# Patient Record
Sex: Male | Born: 1955
Health system: Southern US, Community
[De-identification: ages and names within clinical notes are randomized; demographics above are authoritative.]

## PROBLEM LIST (undated history)

## (undated) DIAGNOSIS — I639 Cerebral infarction, unspecified: Secondary | ICD-10-CM

## (undated) DIAGNOSIS — T4145XA Adverse effect of unspecified anesthetic, initial encounter: Secondary | ICD-10-CM

## (undated) DIAGNOSIS — M549 Dorsalgia, unspecified: Secondary | ICD-10-CM

## (undated) DIAGNOSIS — M1990 Unspecified osteoarthritis, unspecified site: Secondary | ICD-10-CM

## (undated) DIAGNOSIS — Z87442 Personal history of urinary calculi: Secondary | ICD-10-CM

## (undated) DIAGNOSIS — D472 Monoclonal gammopathy: Secondary | ICD-10-CM

## (undated) DIAGNOSIS — G40909 Epilepsy, unspecified, not intractable, without status epilepticus: Secondary | ICD-10-CM

## (undated) DIAGNOSIS — I10 Essential (primary) hypertension: Secondary | ICD-10-CM

## (undated) DIAGNOSIS — G039 Meningitis, unspecified: Secondary | ICD-10-CM

## (undated) DIAGNOSIS — I6529 Occlusion and stenosis of unspecified carotid artery: Secondary | ICD-10-CM

## (undated) DIAGNOSIS — Z9889 Other specified postprocedural states: Secondary | ICD-10-CM

## (undated) DIAGNOSIS — F329 Major depressive disorder, single episode, unspecified: Secondary | ICD-10-CM

## (undated) DIAGNOSIS — I714 Abdominal aortic aneurysm, without rupture, unspecified: Secondary | ICD-10-CM

## (undated) DIAGNOSIS — R569 Unspecified convulsions: Secondary | ICD-10-CM

## (undated) DIAGNOSIS — T8859XA Other complications of anesthesia, initial encounter: Secondary | ICD-10-CM

## (undated) DIAGNOSIS — D649 Anemia, unspecified: Secondary | ICD-10-CM

## (undated) DIAGNOSIS — E785 Hyperlipidemia, unspecified: Secondary | ICD-10-CM

## (undated) DIAGNOSIS — R1909 Other intra-abdominal and pelvic swelling, mass and lump: Secondary | ICD-10-CM

## (undated) DIAGNOSIS — F32A Depression, unspecified: Secondary | ICD-10-CM

## (undated) DIAGNOSIS — J449 Chronic obstructive pulmonary disease, unspecified: Secondary | ICD-10-CM

## (undated) DIAGNOSIS — I771 Stricture of artery: Secondary | ICD-10-CM

## (undated) DIAGNOSIS — R58 Hemorrhage, not elsewhere classified: Secondary | ICD-10-CM

## (undated) DIAGNOSIS — R112 Nausea with vomiting, unspecified: Secondary | ICD-10-CM

## (undated) HISTORY — DX: Hyperlipidemia, unspecified: E78.5

## (undated) HISTORY — DX: Cerebral infarction, unspecified: I63.9

## (undated) HISTORY — DX: Essential (primary) hypertension: I10

## (undated) HISTORY — DX: Hemorrhage, not elsewhere classified: R58

## (undated) HISTORY — DX: Epilepsy, unspecified, not intractable, without status epilepticus: G40.909

## (undated) HISTORY — DX: Monoclonal gammopathy: D47.2

## (undated) HISTORY — DX: Meningitis, unspecified: G03.9

## (undated) HISTORY — DX: Stricture of artery: I77.1

## (undated) HISTORY — DX: Occlusion and stenosis of unspecified carotid artery: I65.29

---

## 1994-04-12 DIAGNOSIS — R58 Hemorrhage, not elsewhere classified: Secondary | ICD-10-CM

## 1994-04-12 HISTORY — DX: Hemorrhage, not elsewhere classified: R58

## 1999-07-06 ENCOUNTER — Ambulatory Visit (HOSPITAL_BASED_OUTPATIENT_CLINIC_OR_DEPARTMENT_OTHER): Admission: RE | Admit: 1999-07-06 | Discharge: 1999-07-06 | Payer: Self-pay | Admitting: General Surgery

## 2000-06-16 ENCOUNTER — Encounter (INDEPENDENT_AMBULATORY_CARE_PROVIDER_SITE_OTHER): Payer: Self-pay | Admitting: *Deleted

## 2000-06-16 ENCOUNTER — Ambulatory Visit (HOSPITAL_COMMUNITY): Admission: RE | Admit: 2000-06-16 | Discharge: 2000-06-16 | Payer: Self-pay | Admitting: Gastroenterology

## 2001-06-16 HISTORY — PX: COLONOSCOPY: SHX174

## 2005-06-21 ENCOUNTER — Emergency Department (HOSPITAL_COMMUNITY): Admission: EM | Admit: 2005-06-21 | Discharge: 2005-06-21 | Payer: Self-pay | Admitting: Emergency Medicine

## 2007-11-03 ENCOUNTER — Encounter: Admission: RE | Admit: 2007-11-03 | Discharge: 2007-11-03 | Payer: Self-pay | Admitting: Internal Medicine

## 2009-05-27 ENCOUNTER — Encounter: Payer: Self-pay | Admitting: Emergency Medicine

## 2009-05-27 ENCOUNTER — Ambulatory Visit: Payer: Self-pay | Admitting: Cardiology

## 2009-05-27 ENCOUNTER — Ambulatory Visit: Payer: Self-pay | Admitting: Diagnostic Radiology

## 2009-05-27 ENCOUNTER — Inpatient Hospital Stay (HOSPITAL_COMMUNITY): Admission: AD | Admit: 2009-05-27 | Discharge: 2009-05-30 | Payer: Self-pay | Admitting: Internal Medicine

## 2009-05-27 DIAGNOSIS — I639 Cerebral infarction, unspecified: Secondary | ICD-10-CM

## 2009-05-27 HISTORY — DX: Cerebral infarction, unspecified: I63.9

## 2009-05-28 ENCOUNTER — Encounter (INDEPENDENT_AMBULATORY_CARE_PROVIDER_SITE_OTHER): Payer: Self-pay | Admitting: Internal Medicine

## 2009-05-29 ENCOUNTER — Ambulatory Visit: Payer: Self-pay | Admitting: Vascular Surgery

## 2009-06-10 DIAGNOSIS — I639 Cerebral infarction, unspecified: Secondary | ICD-10-CM

## 2009-06-10 HISTORY — DX: Cerebral infarction, unspecified: I63.9

## 2009-06-10 HISTORY — PX: OTHER SURGICAL HISTORY: SHX169

## 2009-06-12 ENCOUNTER — Encounter: Payer: Self-pay | Admitting: Vascular Surgery

## 2009-06-12 ENCOUNTER — Inpatient Hospital Stay (HOSPITAL_COMMUNITY): Admission: RE | Admit: 2009-06-12 | Discharge: 2009-06-13 | Payer: Self-pay | Admitting: Vascular Surgery

## 2009-06-12 HISTORY — PX: CAROTID ENDARTERECTOMY: SUR193

## 2009-07-04 ENCOUNTER — Ambulatory Visit: Payer: Self-pay | Admitting: Vascular Surgery

## 2009-07-05 HISTORY — PX: OTHER SURGICAL HISTORY: SHX169

## 2009-12-16 ENCOUNTER — Ambulatory Visit: Payer: Self-pay | Admitting: Diagnostic Radiology

## 2009-12-16 ENCOUNTER — Encounter: Payer: Self-pay | Admitting: Emergency Medicine

## 2009-12-16 ENCOUNTER — Ambulatory Visit: Payer: Self-pay | Admitting: Cardiovascular Disease

## 2009-12-16 ENCOUNTER — Inpatient Hospital Stay (HOSPITAL_COMMUNITY): Admission: AD | Admit: 2009-12-16 | Discharge: 2009-12-17 | Payer: Self-pay | Admitting: Neurology

## 2009-12-17 ENCOUNTER — Encounter (INDEPENDENT_AMBULATORY_CARE_PROVIDER_SITE_OTHER): Payer: Self-pay | Admitting: Neurology

## 2010-05-08 ENCOUNTER — Emergency Department (INDEPENDENT_AMBULATORY_CARE_PROVIDER_SITE_OTHER)
Admission: EM | Admit: 2010-05-08 | Discharge: 2010-05-09 | Payer: 59 | Source: Home / Self Care | Admitting: Emergency Medicine

## 2010-05-08 DIAGNOSIS — R079 Chest pain, unspecified: Secondary | ICD-10-CM

## 2010-05-08 LAB — URINALYSIS, ROUTINE W REFLEX MICROSCOPIC
Hgb urine dipstick: NEGATIVE
Nitrite: NEGATIVE
Protein, ur: NEGATIVE mg/dL
Urobilinogen, UA: 0.2 mg/dL (ref 0.0–1.0)
pH: 7 (ref 5.0–8.0)

## 2010-05-08 LAB — CBC
Hemoglobin: 14.7 g/dL (ref 13.0–17.0)
MCH: 32 pg (ref 26.0–34.0)
MCHC: 35.9 g/dL (ref 30.0–36.0)
MCV: 89.1 fL (ref 78.0–100.0)
Platelets: 256 10*3/uL (ref 150–400)

## 2010-05-08 LAB — COMPREHENSIVE METABOLIC PANEL
Albumin: 4.2 g/dL (ref 3.5–5.2)
Alkaline Phosphatase: 110 U/L (ref 39–117)
Calcium: 9.4 mg/dL (ref 8.4–10.5)
Creatinine, Ser: 0.8 mg/dL (ref 0.4–1.5)
GFR calc Af Amer: >60 mL/min (ref >60)
GFR calc non Af Amer: >60 mL/min (ref >60)
Glucose, Bld: 127 mg/dL — ABNORMAL HIGH (ref 70–99)
Sodium: 141 mEq/L (ref 135–145)
Total Protein: 7.7 g/dL (ref 6.0–8.3)

## 2010-05-08 LAB — POCT TOXICOLOGY PANEL

## 2010-05-08 LAB — POCT CARDIAC MARKERS
CKMB, poc: <1 ng/mL — ABNORMAL LOW (ref 1.0–8.0)
Myoglobin, poc: 44 ng/mL (ref 12–200)
Troponin i, poc: <0.05 ng/mL (ref 0.00–0.09)
Troponin i, poc: <0.05 ng/mL (ref 0.00–0.09)

## 2010-05-08 LAB — DIFFERENTIAL
Lymphs Abs: 2.6 10*3/uL (ref 0.7–4.0)
Monocytes Relative: 11 % (ref 3–12)
Neutro Abs: 5 10*3/uL (ref 1.7–7.7)
Neutrophils Relative %: 55 % (ref 43–77)

## 2010-05-09 ENCOUNTER — Inpatient Hospital Stay (HOSPITAL_COMMUNITY)
Admission: EM | Admit: 2010-05-09 | Discharge: 2010-05-10 | Payer: Self-pay | Attending: Internal Medicine | Admitting: Internal Medicine

## 2010-05-09 LAB — HEMOGLOBIN A1C
Hgb A1c MFr Bld: 5.6 % (ref <5.7)
Mean Plasma Glucose: 114 mg/dL (ref <117)

## 2010-05-09 LAB — CARDIAC PANEL(CRET KIN+CKTOT+MB+TROPI)
CK, MB: 1.2 ng/mL (ref 0.3–4.0)
Relative Index: INVALID (ref 0.0–2.5)
Relative Index: INVALID (ref 0.0–2.5)
Total CK: 70 U/L (ref 7–232)
Total CK: 62 U/L (ref 7–232)

## 2010-05-09 LAB — LIPID PANEL
HDL: 31 mg/dL — ABNORMAL LOW (ref >39)
LDL Cholesterol: 105 mg/dL — ABNORMAL HIGH (ref 0–99)
Triglycerides: 56 mg/dL (ref <150)
VLDL: 11 mg/dL (ref 0–40)

## 2010-05-18 DIAGNOSIS — G40909 Epilepsy, unspecified, not intractable, without status epilepticus: Secondary | ICD-10-CM | POA: Insufficient documentation

## 2010-05-19 ENCOUNTER — Encounter: Payer: Self-pay | Admitting: Cardiovascular Disease

## 2010-05-19 ENCOUNTER — Ambulatory Visit (INDEPENDENT_AMBULATORY_CARE_PROVIDER_SITE_OTHER): Payer: 59 | Admitting: Cardiovascular Disease

## 2010-05-19 ENCOUNTER — Other Ambulatory Visit: Payer: Self-pay | Admitting: Gastroenterology

## 2010-05-19 DIAGNOSIS — I10 Essential (primary) hypertension: Secondary | ICD-10-CM

## 2010-05-19 DIAGNOSIS — F17200 Nicotine dependence, unspecified, uncomplicated: Secondary | ICD-10-CM

## 2010-05-19 DIAGNOSIS — R079 Chest pain, unspecified: Secondary | ICD-10-CM

## 2010-05-19 DIAGNOSIS — R0989 Other specified symptoms and signs involving the circulatory and respiratory systems: Secondary | ICD-10-CM

## 2010-05-20 LAB — CONVERTED CEMR LAB
BUN: 10 mg/dL (ref 6–23)
Basophils Absolute: 0 10*3/uL (ref 0.0–0.1)
CO2: 28 meq/L (ref 19–32)
Eosinophils Relative: 4 % (ref 0–5)
Glucose, Bld: 86 mg/dL (ref 70–99)
HCT: 41.2 % (ref 39.0–52.0)
Hemoglobin: 14.1 g/dL (ref 13.0–17.0)
Lymphocytes Relative: 28 % (ref 12–46)
MCHC: 34.2 g/dL (ref 30.0–36.0)
Monocytes Absolute: 1 10*3/uL (ref 0.1–1.0)
Monocytes Relative: 11 % (ref 3–12)
Potassium: 4.2 meq/L (ref 3.5–5.3)
RDW: 12.3 % (ref 11.5–15.5)

## 2010-05-26 ENCOUNTER — Observation Stay (HOSPITAL_COMMUNITY)
Admission: RE | Admit: 2010-05-26 | Discharge: 2010-05-26 | Disposition: A | Discharge status: Home / Self Care | Payer: 59 | Source: Ambulatory Visit | Attending: Cardiovascular Disease | Admitting: Cardiovascular Disease

## 2010-05-26 DIAGNOSIS — Z8673 Personal history of transient ischemic attack (TIA), and cerebral infarction without residual deficits: Secondary | ICD-10-CM | POA: Insufficient documentation

## 2010-05-26 DIAGNOSIS — I10 Essential (primary) hypertension: Secondary | ICD-10-CM | POA: Insufficient documentation

## 2010-05-26 DIAGNOSIS — F17200 Nicotine dependence, unspecified, uncomplicated: Principal | ICD-10-CM | POA: Insufficient documentation

## 2010-05-26 DIAGNOSIS — Z01810 Encounter for preprocedural cardiovascular examination: Secondary | ICD-10-CM | POA: Insufficient documentation

## 2010-05-26 DIAGNOSIS — E785 Hyperlipidemia, unspecified: Secondary | ICD-10-CM | POA: Insufficient documentation

## 2010-05-26 DIAGNOSIS — I2510 Atherosclerotic heart disease of native coronary artery without angina pectoris: Secondary | ICD-10-CM

## 2010-05-28 NOTE — Procedures (Signed)
NAMEJESSIAH, STEINHART NO.:  [id number]  MEDICAL RECORD NO.:  [id number]           PATIENT TYPE:  I  LOCATION:  6522                         FACILITY:  [facility]  PHYSICIAN:  Verne Carrow, MDDATE OF BIRTH:  [birth year]  DATE OF PROCEDURE:  05/26/2010 DATE OF DISCHARGE:  05/26/2010                           CARDIAC CATHETERIZATION   PROCEDURE PERFORMED: 1. Left heart catheterization 2. Selective coronary angiography. 3. Left ventricular angiogram.  ARTERIAL ACCESS SITE:  Right radial artery.  OPERATOR:  Verne Carrow, MD  INDICATIONS:  This is a 55 year old Caucasian male with a history of ongoing tobacco abuse, hypertension, hyperlipidemia, previous stroke and previous carotid endarterectomy, who I saw in the office last week.  The patient had episodes of exertional chest pain.  He was admitted to the hospital and ruled out for myocardial infarction with serial cardiac enzymes.  He was discharged to home for further workup.  I felt that based on his risk factors and the fact that he had known vascular disease, a cardiac catheterization was indicated to ultimately exclude obstructive coronary artery disease and the cause of his chest pain.  DETAILS OF PROCEDURE:  The patient was brought to the main Cardiac Catheterization Laboratory after signing informed consent for the procedure.  An Freida Busman test was performed on the right wrist and was positive.  The right wrist was prepped and draped in sterile fashion.  A 1% lidocaine was used for local anesthesia.  A 5-French sheath was inserted into the right radial artery without difficulty.  A 3 mg of verapamil was given through the sheath.  A 4000 units of intravenous heparin was given after sheath insertion.  Standard diagnostic catheters were used to perform selective coronary angiography.  A pigtail catheter was used to perform a left ventricular angiogram.  The patient tolerated the procedure well.   The sheath was removed from the right radial artery and a Terumo hemostasis band was applied over the arteriotomy site. There were no immediate complications.  The patient was taken to the holding area in stable condition.  HEMODYNAMIC FINDINGS:  Central aortic pressure 99/58.  Left ventricular pressure 127/7.  Left ventricular end-diastolic pressure of 14.  ANGIOGRAPHIC FINDINGS: 1. The left main coronary artery had no obstructive disease. 2. The left anterior descending was a large vessel that coursed to the     apex and gave off a moderate-sized diagonal branch.  A left     anterior descending artery had mild 20% plaque in the midportion.     There were no obstructive lesions noted in this vessel. 3. Circumflex artery was comprised mainly of an obtuse marginal branch     was moderate size.  This branch had mild plaque disease, but no     obstructive lesions. 4. The right coronary artery is a large dominant vessel with proximal     20% stenosis, mid 40% stenosis, and distal 30% stenosis.  Posterior     descending artery had an ostial 40% stenosis.  There were no flow-     limiting lesions noted in this vessel. 5. Left ventricular angiogram was performed in the RAO projection  showed normal left ventricular systolic function with ejection     fraction of 55%.  IMPRESSION: 1. Nonobstructive coronary artery disease. 2. Noncardiac chest pain. 3. Normal left ventricular systolic function. 4. Ongoing tobacco abuse.  RECOMMENDATIONS:  At this time, I recommend medical management with a statin and an aspirin.  We will also encourage complete tobacco cessation.     Verne Carrow, MD     CM/MEDQ  D:  05/26/2010  T:  05/27/2010  Job:  [id number]  Electronically Signed by Verne Carrow MD on 05/28/2010 11:07:43 AM

## 2010-05-28 NOTE — Letter (Signed)
Summary: Cardiac Catheterization Instructions- Main Lab  [employer], Main Office  [address]   [place], [place] [address]   Phone: [phone]  Fax: [phone]     05/19/2010 [name], [place]  [address]  [place]  Dear Mr. Charles, [name] are scheduled for Cardiac Catheterization on  05/26/10            with Dr. Clifton James.  Please arrive at the [facility] at 7:00      a.m.on the day of your procedure.  1. DIET     ___x_ Nothing to eat or drink after midnight except your medications with a sip of water.         __x__ YOU MAY TAKE ALL of your remaining medications with a small amount of water.     - PLEASE TAKE YOUR ASPIRIN 325 mg the day before and the morning of your procedure.  2. Plan for one night stay - bring personal belongings (i.e. toothpaste, toothbrush, etc.)  3. Bring a current list of your medications and current insurance cards.  4. Must have a responsible person to drive you home.   5. Someone must be with yu for the first 24 hours after you arrive home.  6. Please wear clothes that are easy to get on and off and wear slip-on shoes.  *Special note: Every effort is made to have your procedure done on time.  Occasionally there are emergencies that present themselves at the hospital that may cause delays.  Please be patient if a delay does occur.  If you have any questions after you get home, please call the office at the number listed above.  Whitney Maeola Sarah RN

## 2010-05-28 NOTE — Assessment & Plan Note (Signed)
Summary: np6/ per chris/chest pain ...records in emr.Marland KitchenLonell Grandchild   Visit Type:  Initial Consult Primary Provider:  [facility]  CC:  chest pain and sob.  History of Present Illness: 55 yo WM with history of carotid artery disease, HTN, hyperlipidemia, previous CVA, ongoing tobacco abuse and family history of CAD who is here today for a new cardiac evaluation. 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 He has not had a previous cardiac evaluation other than an echo in September 2011 which showed mild LVH, normal LV function, no significant valvular heart disease. He has had several episodes of chest pain recently. The most severe episode was on 05/09/10. The pain was severe and radiated to the jaw. There was associated dyspnea. He was admitted to [facility] and ruled out for an MI with serial enzymes. His EKG showed no ischemia. He was discharged home after an overnight stay with plans for follow up in primary care.   He tells me today that he has been working with no recurrent chest pain. He does have SOB with exertion. No other complaints today. He continues to smoke 1ppd.   Current Medications (verified): 1)  Timolol Maleate 0.25 % Soln (Timolol Maleate) .... Uad 2)  Tegretol Xr 400 Mg Xr12h-Tab (Carbamazepine) .... 4 By Mouth Daily 3)  Vitamin D3 2000 Unit Caps (Cholecalciferol) .Marland Kitchen.. 1` By Mouth Daily 4)  Crestor 10 Mg Tabs (Rosuvastatin Calcium) .Marland Kitchen.. 1 By Mouth Daily 5)  Zoloft 50 Mg Tabs (Sertraline Hcl) .Marland Kitchen.. 1 By Mouth Daily 6)  Cialis 10 Mg Tabs (Tadalafil) .... As Needed 7)  Symbicort 80-4.5 Mcg/act Aero (Budesonide-Formoterol Fumarate) .... As Needed 8)  Diovan Hct 160-25 Mg Tabs (Valsartan-Hydrochlorothiazide) .Marland Kitchen.. 1 By Mouth Daily  Allergies (verified): No Known Drug Allergies  Past History:  Past Medical History:  1. Intracerebral hemorrhage, 1996.   2. History of CVA May 27, 2009. TIA, posterior circulation July 2011  3. Hypertension.   4. Hyperlipidemia.   5.  History of right ICA stenosis, status post right carotid       endarterectomy on June 13, 2009.   6. Left carotid artery stenosis.   7. Alcohol abuse in remission since December 08, 2009.   8. Tobacco dependence.  x 40 years (1ppd)  9. Glaucoma.   10.Seizure disorder at time of ICH in 1996  11.History of viral meningitis as child  Past Surgical History:  1. Right inguinal hernia repair on July 05, 2009.   2. Colonoscopy, June 16, 2000 by Dr. Danise Edge, positive for       colonic polyp, non-adenomatous.  3. Right CEA March 2011     Family History: Reviewed history from 05/18/2010 and no changes required. Mother died at age 14 from Alzheimer's.   Father died of  heart attack at age 48 and was a smoker.  Sister-x 2 alive Brother-x 2 alive, 1 deceased.      Social History: Reviewed history from 05/18/2010 and no changes required.  The patient lives with his wife in [place].  He has 2 children. His daughter Shanda Bumps is a nurse on [address] at [employer]  He smokes one-pack per day x30-40 years.   He occasionally drinks. He has a history of abuse in the past No illicit drug use.      Review of Systems       The patient complains of chest pain and shortness of breath.  The patient denies fatigue, malaise, fever, weight gain/loss, vision loss, decreased hearing,  hoarseness, palpitations, prolonged cough, wheezing, sleep apnea, coughing up blood, abdominal pain, blood in stool, nausea, vomiting, diarrhea, heartburn, incontinence, blood in urine, muscle weakness, joint pain, leg swelling, rash, skin lesions, headache, fainting, dizziness, depression, anxiety, enlarged lymph nodes, easy bruising or bleeding, and environmental allergies.    Vital Signs:  Patient profile:   55 year old male Height:      74 inches Weight:      179 pounds BMI:     23.07 Pulse rate:   59 / minute Resp:     18 per minute BP sitting:   155 / 78  (right arm)  Vitals Entered By: Marrion Coy, CNA (May 19, 2010 4:21 PM)  Physical Exam  General:  General: Well developed, well nourished, NAD HEENT: OP clear, mucus membranes moist SKIN: warm, dry Neuro: No focal deficits Musculoskeletal: Muscle strength 5/5 all ext Psychiatric: Mood and affect normal Neck: No JVD, Right  carotid bruits, no left carotid bruit, no thyromegaly, no lymphadenopathy. Lungs:Clear bilaterally, no wheezes, rhonci, crackles CV: RRR no murmurs, gallops rubs Abdomen: soft, NT, ND, BS present Extremities: No edema, pulses 2+.    EKG  Procedure date:  05/19/2010  Findings:      Sinus brady, rate 59 bpm. No ischemic changes.   Impression & Recommendations:  Problem # 1:  CHEST PAIN (ICD-786.50) He has multiple risk factors for CAD including HTN, hyperlipidemia, tobacco abuse and family history of CAD. He is also known to have carotid artery disease. His chest pain is concerning for angina. I have recommended that we proceed with a diagnostic cardiac catheterization to exclude CAD. He has a high pre-test probabliity of disease. He is agreeable to pursue cath. I have reviewed the risks and benefits of the procedure. Will plan for 05/26/10 at 9am. Will check BMET, CBC and coags today.   Orders: T-Basic Metabolic Panel [phone]) T-CBC w/Diff [id number]) T-Protime, Auto ([id number]) EKG w/ Interpretation (93000) Cardiac Catheterization (Cardiac Cath)  Problem # 2:  HYPERTENSION (ICD-401.9) Will adjust therapy following cath.   His updated medication list for this problem includes:    Diovan Hct 160-25 Mg Tabs (Valsartan-hydrochlorothiazide) .Marland Kitchen... 1 by mouth daily  Problem # 3:  NICOTINE ADDICTION (ICD-305.1) Smoking cessation advised.   Patient Instructions: 1)  Your physician recommends that you schedule a follow-up appointment in: 3-4 weeks. 2)  Your physician recommends that you continue on your current medications as directed. Please refer to the Current Medication list given to you today. 3)  Your  physician has requested that you have a cardiac catheterization.  Cardiac catheterization is used to diagnose and/or treat various heart conditions. Doctors may recommend this procedure for a number of different reasons. The most common reason is to evaluate chest pain. Chest pain can be a symptom of coronary artery disease (CAD), and cardiac catheterization can show whether plaque is narrowing or blocking your heart's arteries. This procedure is also used to evaluate the valves, as well as measure the blood flow and oxygen levels in different parts of your heart.  For further information please visit [email or web address]  Please follow instruction sheet, as given.

## 2010-06-03 NOTE — H&P (Signed)
  [name], [name] NO.:  [id number]  MEDICAL RECORD NO.:  [id number]          PATIENT TYPE:  INP  LOCATION:  2035                         FACILITY:  [facility]  PHYSICIAN:  Massie Maroon, MD        DATE OF BIRTH:  04-Jun-1955  DATE OF ADMISSION:  05/09/2010 DATE OF DISCHARGE:                             HISTORY & PHYSICAL   This patient is unassigned.  CHIEF COMPLAINT:  Chest pain.  HISTORY OF PRESENT ILLNESS:  A 55 year old male with a history of cocaine abuse, polysubstance abuse, CAD status post CABG, bipolar disorder, apparently was feeling suicidal and decided to do cocaine and then developed left chest pressure with radiation to the left arm associated with shortness of breath, nausea, and vomiting about an hour prior to arrival in the ED.  The patient states he is still having some discomfort.  His EKG apparently showed tachycardic at 130, normal sinus rhythm, normal axis, early R-wave progression, no ST elevation, similar to April 19, 2003.  Chest x-ray showed mild hyperexpansion without any acute process.  The patient will be admitted for workup of chest pain as well as suicidal ideation.  PAST MEDICAL HISTORY:  Cancelled dictation.     Massie Maroon, MD     JYK/MEDQ  D:  05/09/2010  T:  05/09/2010  Job:  [id number]  Electronically Signed by Pearson Grippe MD on 06/03/2010 06:46:38 AM

## 2010-06-03 NOTE — H&P (Signed)
[name], BUELOW NO.:  [id number]  MEDICAL RECORD NO.:  [id number]          PATIENT TYPE:  INP  LOCATION:  2035                         FACILITY:  [facility]  PHYSICIAN:  Massie Maroon, MD        DATE OF BIRTH:  1955/10/15  DATE OF ADMISSION:  05/09/2010 DATE OF DISCHARGE:                             HISTORY & PHYSICAL   CHIEF COMPLAINT:  Chest pain.  HISTORY OF PRESENT ILLNESS:  A 55 year old man with a history of hypertension, hyperlipidemia, TIA, right ICA stenosis status post right carotid endarterectomy, seizure disorder, intracerebral hemorrhage, viral meningitis, apparently presents with complaints of right-sided chest pain starting about 8 p.m. last night.  It was like "I got hit into the chest" and there was radiation of the pain in the neck and the jaw, and it was associated with some shortness of breath and heartburn. The patient denies any fever, chills, cough, nausea, vomiting, palpitations, diarrhea, bright red blood per rectum or black stool.  The patient was evaluated at the ED at [facility].  He was apparently given sublingual nitroglycerin, but the pain did not go away immediately and it went away about 10 minutes after he was given the nitroglycerin.  The patient apparently had an EKG, which per ED physician showed normal sinus rhythm at 60, normal axis, Q-waves, nonspecific ST changes.  I did not see the EKG in the chart, and it does not appear available to me.  The patient's urine drug screen was positive for opiates.  The patient had 2 sets of cardiac markers, which were negative.  Chest x-ray was negative for any acute process other than some mild hyperexpansion.  The patient will be admitted for workup of chest pain.  PAST MEDICAL HISTORY: 1. Intracerebral hemorrhage, 1996. 2. History of TIA, posterior circulation. 3. Hypertension. 4. Hyperlipidemia. 5. History of right ICA stenosis, status post right carotid  endarterectomy on June 13, 2009. 6. Left carotid artery stenosis. 7. Alcohol abuse in remission since December 08, 2009. 8. Tobacco dependence. 9. Glaucoma. 10.Seizure disorder. 11.History of viral meningitis.  PAST SURGICAL HISTORY: 1. Right inguinal hernia repair on July 05, 2009. 2. Colonoscopy, June 16, 2000 by Dr. Danise Edge, positive for     colonic polyp, non-adenomatous.  SOCIAL HISTORY:  The patient lives with his wife.  He has 2 children. He smokes one-pack per day x30-40 years.  He occasionally drinks.  FAMILY HISTORY:  Mother died at age 31 from Alzheimer's.  Father died of heart attack at age 25 and was a smoker.  REVIEW OF SYSTEMS:  Negative for all 10 organ systems except for pertinent positives stated above.  ALLERGIES:  No known drug allergies.  MEDICATIONS:  Please see med rec.  PHYSICAL EXAMINATION:  VITAL SIGNS:  Temperature 97.5, pulse 63, blood pressure 180/86, pulse ox is 99% on room air. HEENT:  Anicteric, EOMI.  No nystagmus.  Pupils 1.5 mm, symmetric, direct consensual near reflexes intact.  Mucous membranes moist. NECK:  No JVD, no bruit, no thyromegaly, no adenopathy. HEART:  Regular rate and rhythm.  S1 and S2.  No murmurs,  gallops or rubs. LUNGS:  Clear to auscultation bilaterally. ABDOMEN:  Soft, nontender, nondistended.  Positive bowel sounds. EXTREMITIES:  No cyanosis, clubbing, or edema. SKIN:  No rashes. LYMPH NODES:  No adenopathy. NEUROLOGIC:  Nonfocal.  LABORATORY DATA:  WBC 9.0, hemoglobin 14.7, platelet count 256.  Sodium 141, potassium 3.7, BUN 20, creatinine 0.8.  AST 21, ALT 28.  Lipase 142.  Troponin I less than 0.05.  Urinalysis is negative.  Urine toxicology panel positive for opiates and tricyclics.  Chest x-ray negative for any acute process other than some mild hyperexpansion.  ASSESSMENT AND PLAN: 1. Chest pain:  The patient will be placed on telemetry.  We will     check CK, CK-MB, troponin I q.6 h x3 sets.  Check a  lipid panel in     the a.m.  Check a hemoglobin A1c in the a.m.  The patient will be     continued on Plavix 75 mg p.o. daily and Crestor 20 mg p.o. at     bedtime.  Cannot use beta-blocker due to already bradycardic heart     rhythm. 2. Seizure disorder:  Continue Tegretol. 3. Anxiety:  Continue Xanax and Zoloft. 4. Glaucoma:  Continue timolol. 5. Tobacco dependence:  Start nicotine patch. 6. Deep venous thrombosis prophylaxis:  Lovenox. 7. Carotid stenosis, transient ischemic attack:  The patient can have     this followed up as an outpatient. 8. Disposition:  I am not sure if stress testing is available in the     a.m.  If it is not and the patient rules out with negative sets of     cardiac markers, he can be discharged with close followup stress     test, otherwise please obtain a stress test in the morning.     Massie Maroon, MD     JYK/MEDQ  D:  05/09/2010  T:  05/09/2010  Job:  [id number]  Electronically Signed by Pearson Grippe MD on 06/03/2010 06:46:29 AM

## 2010-06-03 NOTE — Discharge Summary (Addendum)
NAMEJACEK, [name] NO.:  [id number]  MEDICAL RECORD NO.:  [id number]          PATIENT TYPE:  INP  LOCATION:  2035                         FACILITY:  [facility]  PHYSICIAN:  Elby Showers, MD    DATE OF BIRTH:  Aug 27, 1955  DATE OF ADMISSION:  05/09/2010 DATE OF DISCHARGE:  05/10/2010                              DISCHARGE SUMMARY   DISCHARGE DIAGNOSES: 1. Atypical chest pain, acute coronary syndrome ruled out. 2. Hypertension. 3. Hyperlipidemia. 4. History of transient ischemic attack, posterior circulation. 5. History of right internal carotid artery stenosis, status post     right carotid endarterectomy, June 13, 2009. 6. Left carotid artery stenosis. 7. Intracerebral hemorrhage in 1996. 8. Alcohol abuse in remission since December 08, 2009. 9. Tobacco dependence. 10.Glaucoma. 11.Seizure disorder. 12.History of viral meningitis.  DISCHARGE MEDICATIONS: 1. Aspirin 81 mg one tablet daily. 2. Tegretol-XR 400 mg one tablet four times daily. 3. Alprazolam 0.25 mg one tablet every 6 hours as needed. 4. Cialis 10 mg one tablet as directed. 5. Crestor 10 mg one tablet daily. 6. Sertraline 50 mg one tablet daily. 7. Dorzolamide/timolol ophthalmic drops one drop both eyes twice a     day. 8. Plavix 75 mg daily.  DISPOSITION:  The patient is discharged in stable condition with no residual chest pain.  He is to follow up with his primary care physician, Dr. Johnella Moloney as soon as possible for appointment.  CONSULTATIONS:  None.  PROCEDURES: 1. Chest x-ray shows mild hyperexpansion without acute cardiopulmonary     disease. 2. EKG; normal sinus rhythm, no ST changes, possible left ventricular     hypertrophy.  HISTORY AND PHYSICAL:  This 55 year old man with history of hypertension; hyperlipidemia; peripheral vascular disease, status post right carotid endarterectomy, presented with complaint of right-sided chest pain.  Pain was described as pressure,  like being hit in the chest, there was radiation to the neck and the jaw.  The pain was associated with shortness of breath.  He was initially evaluated at [facility], sublingual nitroglycerin was administered, pain did not resolve.  He was transferred to [facility] and admitted for ACS rule out.  HOSPITAL COURSE BY PROBLEM: 1. Chest pain.  The patient was admitted to telemetry, there were no     events on telemetry.  He had three sets of negative cardiac     enzymes.  Pain had completely resolved by the morning after     admission.  Labs to evaluate for cardiac risk factors including a     hemoglobin A1c and a lipid profile were performed.  Hemoglobin A1c     is 5.6, lipid panel shows total cholesterol 147, LDL 105, HDL 31.     Note that urine drug screen was positive for opiates, most likely     due to morphine administered in the emergency room. 2. Hypertension.  Blood pressure well controlled in hospital on     current regimen. 3. Hyperlipidemia.  Continue Crestor.     Elby Showers, MD     CW/MEDQ  D:  05/13/2010  T:  05/13/2010  Job:  [id number]  Electronically Signed by Elby Showers MD on 06/03/2010 09:58:37 AM

## 2010-06-23 ENCOUNTER — Other Ambulatory Visit: Payer: Self-pay

## 2010-06-23 ENCOUNTER — Ambulatory Visit: Payer: Self-pay | Admitting: Vascular Surgery

## 2010-06-25 LAB — DIFFERENTIAL
Basophils Relative: 1 % (ref 0–1)
Lymphocytes Relative: 18 % (ref 12–46)
Monocytes Relative: 7 % (ref 3–12)
Neutro Abs: 7 10*3/uL (ref 1.7–7.7)
Neutrophils Relative %: 71 % (ref 43–77)

## 2010-06-25 LAB — CBC
MCH: 32.7 pg (ref 26.0–34.0)
MCHC: 34.5 g/dL (ref 30.0–36.0)
MCV: 94.6 fL (ref 78.0–100.0)
Platelets: 236 10*3/uL (ref 150–400)
RDW: 12.2 % (ref 11.5–15.5)
WBC: 9.9 10*3/uL (ref 4.0–10.5)

## 2010-06-25 LAB — LIPID PANEL
Cholesterol: 171 mg/dL (ref 0–200)
LDL Cholesterol: 114 mg/dL — ABNORMAL HIGH (ref 0–99)
Total CHOL/HDL Ratio: 5.2 RATIO

## 2010-06-25 LAB — CARDIAC PANEL(CRET KIN+CKTOT+MB+TROPI)
CK, MB: 1.1 ng/mL (ref 0.3–4.0)
Troponin I: 0.03 ng/mL (ref 0.00–0.06)

## 2010-06-25 LAB — PROTIME-INR
INR: 0.91 (ref 0.00–1.49)
Prothrombin Time: 12.5 seconds (ref 11.6–15.2)

## 2010-06-25 LAB — HEMOGLOBIN A1C: Hgb A1c MFr Bld: 5.7 % — ABNORMAL HIGH (ref <5.7)

## 2010-06-25 LAB — RAPID URINE DRUG SCREEN, HOSP PERFORMED
Amphetamines: NOT DETECTED
Cocaine: NOT DETECTED
Opiates: NOT DETECTED
Tetrahydrocannabinol: NOT DETECTED

## 2010-06-25 LAB — COMPREHENSIVE METABOLIC PANEL
Albumin: 3.6 g/dL (ref 3.5–5.2)
BUN: 14 mg/dL (ref 6–23)
Calcium: 8.6 mg/dL (ref 8.4–10.5)
Creatinine, Ser: 0.8 mg/dL (ref 0.4–1.5)
Glucose, Bld: 90 mg/dL (ref 70–99)
Total Protein: 7.1 g/dL (ref 6.0–8.3)

## 2010-06-25 LAB — URINALYSIS, ROUTINE W REFLEX MICROSCOPIC
Bilirubin Urine: NEGATIVE
Nitrite: NEGATIVE
Protein, ur: NEGATIVE mg/dL
Specific Gravity, Urine: 1.011 (ref 1.005–1.030)
Urobilinogen, UA: 0.2 mg/dL (ref 0.0–1.0)

## 2010-06-25 LAB — ETHANOL: Alcohol, Ethyl (B): <5 mg/dL (ref 0–10)

## 2010-06-25 LAB — GLUCOSE, CAPILLARY: Glucose-Capillary: 85 mg/dL (ref 70–99)

## 2010-06-25 LAB — CARBAMAZEPINE LEVEL, TOTAL: Carbamazepine Lvl: 14.5 ug/mL — ABNORMAL HIGH (ref 4.0–12.0)

## 2010-06-25 LAB — MRSA PCR SCREENING: MRSA by PCR: NEGATIVE

## 2010-06-30 ENCOUNTER — Other Ambulatory Visit (INDEPENDENT_AMBULATORY_CARE_PROVIDER_SITE_OTHER): Payer: 59

## 2010-06-30 ENCOUNTER — Ambulatory Visit (INDEPENDENT_AMBULATORY_CARE_PROVIDER_SITE_OTHER): Payer: 59 | Admitting: Vascular Surgery

## 2010-06-30 DIAGNOSIS — I6529 Occlusion and stenosis of unspecified carotid artery: Secondary | ICD-10-CM

## 2010-06-30 DIAGNOSIS — Z48812 Encounter for surgical aftercare following surgery on the circulatory system: Secondary | ICD-10-CM

## 2010-07-01 NOTE — Assessment & Plan Note (Signed)
OFFICE VISIT  [name], [name] DOB:  1955-11-09                                       06/30/2010 ZOXWR#:[id number]  The patient presents today for followup of his extracranial cerebrovascular occlusive disease.  He underwent right carotid endarterectomy after suffering a stroke, by myself on June 12, 2009.  He did not make his 46-month followup and is here today for followup.  He reports that he was recently admitted for the possibility of myocardial infarction.  This was ruled out with cardiac cath.  He denies any neurologic deficits.  PAST HISTORY:  Significant for coronary disease, hypertension.  He does continue to smoke cigarettes, 2 packs a day.  He works as an Personnel officer.  He does have a strong family history of premature atherosclerotic disease in his father and mother.  PHYSICAL EXAMINATION:  General:  Well-developed, well-nourished white male appearing stated age in no acute distress.  Vital signs:  Blood pressure is 146/73, pulse 69, respirations 18.  HEENT:  Normal.  He has a well-healed right carotid incision with no bruits bilaterally.  Heart: Regular rate and rhythm.  Chest:  Clear bilaterally without rales, rhonchi or wheezes.  Radial pulses are 2+ bilaterally.  Abdomen:  Soft, nontender.  No masses noted.  Musculoskeletal:  Shows no major deformities or cyanosis.  Neurologic:  No focal weakness, paresthesias. Skin:  Without ulcers or rashes.  He underwent a duplex in our office today and this reveals widely patent endarterectomy on the right with no evidence of stenosis.  He does have a moderate 40%-59% stenosis in the left carotid system on the low end of the scale.  I discussed this with the patient and recommended that we continue to see him on a yearly basis to rule out any progression in his carotid disease.    Larina Earthly, M.D. Electronically Signed  TFE/MEDQ  D:  06/30/2010  T:  07/01/2010  Job:  [id number]

## 2010-07-02 LAB — DIFFERENTIAL
Basophils Absolute: 0.1 10*3/uL (ref 0.0–0.1)
Eosinophils Relative: 4 % (ref 0–5)
Lymphocytes Relative: 29 % (ref 12–46)
Lymphs Abs: 2.2 10*3/uL (ref 0.7–4.0)
Neutro Abs: 4.3 10*3/uL (ref 1.7–7.7)
Neutrophils Relative %: 58 % (ref 43–77)

## 2010-07-02 LAB — BASIC METABOLIC PANEL
BUN: 13 mg/dL (ref 6–23)
Calcium: 8.5 mg/dL (ref 8.4–10.5)
Creatinine, Ser: 0.9 mg/dL (ref 0.4–1.5)
GFR calc non Af Amer: >60 mL/min (ref >60)
Glucose, Bld: 106 mg/dL — ABNORMAL HIGH (ref 70–99)
Potassium: 4.1 mEq/L (ref 3.5–5.1)

## 2010-07-02 LAB — ETHANOL: Alcohol, Ethyl (B): <5 mg/dL (ref 0–10)

## 2010-07-02 LAB — CBC
HCT: 39.5 % (ref 39.0–52.0)
Platelets: 222 10*3/uL (ref 150–400)
RDW: 12.3 % (ref 11.5–15.5)
WBC: 7.5 10*3/uL (ref 4.0–10.5)

## 2010-07-02 LAB — CARBAMAZEPINE LEVEL, TOTAL: Carbamazepine Lvl: 10.6 ug/mL (ref 4.0–12.0)

## 2010-07-03 LAB — LIPID PANEL
LDL Cholesterol: 112 mg/dL — ABNORMAL HIGH (ref 0–99)
Total CHOL/HDL Ratio: 5.8 RATIO
Triglycerides: 114 mg/dL (ref <150)
VLDL: 23 mg/dL (ref 0–40)

## 2010-07-03 LAB — COMPREHENSIVE METABOLIC PANEL
ALT: 14 U/L (ref 0–53)
AST: 15 U/L (ref 0–37)
Alkaline Phosphatase: 95 U/L (ref 39–117)
CO2: 26 mEq/L (ref 19–32)
Calcium: 8.7 mg/dL (ref 8.4–10.5)
GFR calc Af Amer: >60 mL/min (ref >60)
GFR calc non Af Amer: >60 mL/min (ref >60)
Potassium: 3.8 mEq/L (ref 3.5–5.1)
Sodium: 141 mEq/L (ref 135–145)
Total Protein: 6.5 g/dL (ref 6.0–8.3)

## 2010-07-03 LAB — CBC
Hemoglobin: 14.5 g/dL (ref 13.0–17.0)
MCHC: 34.7 g/dL (ref 30.0–36.0)
RBC: 4.4 MIL/uL (ref 4.22–5.81)

## 2010-07-03 LAB — AMMONIA: Ammonia: 73 umol/L — ABNORMAL HIGH (ref 11–35)

## 2010-07-06 LAB — TYPE AND SCREEN
ABO/RH(D): O POS
Antibody Screen: NEGATIVE

## 2010-07-06 LAB — BASIC METABOLIC PANEL
BUN: 7 mg/dL (ref 6–23)
CO2: 25 mEq/L (ref 19–32)
Chloride: 108 mEq/L (ref 96–112)
Creatinine, Ser: 0.82 mg/dL (ref 0.4–1.5)
Glucose, Bld: 120 mg/dL — ABNORMAL HIGH (ref 70–99)

## 2010-07-06 LAB — CBC
MCHC: 34.5 g/dL (ref 30.0–36.0)
MCHC: 34.9 g/dL (ref 30.0–36.0)
MCV: 93.9 fL (ref 78.0–100.0)
Platelets: 171 10*3/uL (ref 150–400)
RDW: 12.6 % (ref 11.5–15.5)
RDW: 12.7 % (ref 11.5–15.5)
WBC: 10.8 10*3/uL — ABNORMAL HIGH (ref 4.0–10.5)

## 2010-07-06 LAB — URINALYSIS, ROUTINE W REFLEX MICROSCOPIC
Hgb urine dipstick: NEGATIVE
Specific Gravity, Urine: 1.02 (ref 1.005–1.030)
Urobilinogen, UA: 0.2 mg/dL (ref 0.0–1.0)

## 2010-07-06 LAB — COMPREHENSIVE METABOLIC PANEL
ALT: 24 U/L (ref 0–53)
AST: 18 U/L (ref 0–37)
Albumin: 3.9 g/dL (ref 3.5–5.2)
Calcium: 8.9 mg/dL (ref 8.4–10.5)
GFR calc Af Amer: >60 mL/min (ref >60)
Sodium: 138 mEq/L (ref 135–145)
Total Protein: 7.5 g/dL (ref 6.0–8.3)

## 2010-07-06 LAB — PROTIME-INR: INR: 1.02 (ref 0.00–1.49)

## 2010-07-06 LAB — MRSA PCR SCREENING: MRSA by PCR: NEGATIVE

## 2010-07-07 NOTE — Procedures (Unsigned)
CAROTID DUPLEX EXAM  INDICATION:  Followup carotid artery stenosis.  HISTORY: Diabetes:  No. Cardiac:  CAD. Hypertension:  Yes. Smoking:  Current. Previous Surgery:  Right carotid endarterectomy 06/12/2009. CV History:  Yes January 2011. Amaurosis Fugax No, Paresthesias No, Hemiparesis No                                      RIGHT             LEFT Brachial systolic pressure:         108               120 Brachial Doppler waveforms:         Within normal limits                Within normal limits Vertebral direction of flow:        Antegrade         Antegrade DUPLEX VELOCITIES (cm/sec) CCA peak systolic                   103               120 ECA peak systolic                   139               137 ICA peak systolic                   76                114 ICA end diastolic                   19                43 PLAQUE MORPHOLOGY:                  Intimal wall thickening             Heterogeneous PLAQUE AMOUNT:                      Minimal           Moderate PLAQUE LOCATION:                    CCA, ICA          CCA, ICA  IMPRESSION: 1. Patent right internal carotid artery with minimal myo intimal     thickening present status post endarterectomy. 2. Left internal carotid artery stenosis in the 40% to 59% range. 3. Patent bilateral external carotid arteries.  ___________________________________________ Larina Earthly, M.D.  SH/MEDQ  D:  06/30/2010  T:  06/30/2010  Job:  [id number]

## 2010-08-25 NOTE — Assessment & Plan Note (Signed)
OFFICE VISIT   MARQUIE, ADERHOLD  DOB:  [birth year]                                       07/04/2009  EXBMW#:[id number]   Patient presents today for follow-up of right carotid endarterectomy for  symptomatic carotid disease on 06/12/2009.  He did well in the hospital  and was discharged home without difficulty.  He is returning to his  baseline and has had no new neurologic deficits.  He reports that over  the past 2 days, he has had increasing shortness of breath and overall  not feeling well.   His carotid incision is well healed.  He has the usual amount of para-  incisional numbness.  He is neurologically at his baseline.  His radial  pulses, femoral, and posterior tibial pulses are 2+ bilaterally.  He  also reports some bilateral lower extremity discomfort as well.   I feel that he is recovering nicely from a vascular standpoint.  I am  unclear of the etiology of his chest issues.  We will discuss this with  Dr. Kevan Ny on the telephone for further planning and will see him in 6  months with a carotid duplex followup.     Larina Earthly, M.D.  Electronically Signed   TFE/MEDQ  D:  07/04/2009  T:  07/04/2009  Job:  [id number]   cc:   Candyce Churn, M.D.

## 2010-08-28 NOTE — Op Note (Signed)
. [facility]  Patient:    JADD, GASIOR                      MRN: [id number] Proc. Date: 07/06/99 Adm. Date:  [id number] Attending:  Janalyn Rouse                           Operative Report  PREOPERATIVE DIAGNOSIS:  Right inguinal hernia.  POSTOPERATIVE DIAGNOSIS:  Right direct inguinal hernia.  PROCEDURE:  Right inguinal hernia repair with Marlex mesh.  SURGEON:  Rose Phi. Maple Hudson, M.D.  ANESTHESIA:  MAC.  OPERATIVE PROCEDURE:  Patient placed on the operating table and the right lower  abdomen prepped and draped in usual fashion.  A mixture of 0.25% Marcaine and 1% Xylocaine with sodium bicarbonate was used for local infiltration.  An oblique incision was made with dissection down to the fascia of the external oblique.  Hemostasis obtained with the cautery.  We then  infiltrated under the external oblique fascia and incised down through the external oblique fascia down through the external ring.  Self-retaining retractor was inserted.  The ilioinguinal nerve was identified, retracted out of the way and hen the spermatic cord mobilized and a drain placed about it.  There was no indirect component.  He did have a direct hernial defect.  I took appropriately trimmed Marlex mesh and placed it on the floor of the canal with the lateral margin incised for the cord.  I then sutured it in place in a tension-free fashion with interrupted 2-0 Prolene sutures.  The tail of mesh was then sutured lateral to the cord.  The external oblique fascia was closed over the cord with a running 2-0 Vicryl. A 3-0 Vicryl used in subcutaneous tissue.  Subcuticular closure of 4-0 Monocryl and Steri-Strips carried out.  Dressing applied.  Patient transferred to recovery room in satisfactory condition having tolerated the procedure well. DD:  07/06/99 TD:  07/06/99 Job: [id number]

## 2010-08-28 NOTE — Procedures (Signed)
[place]  Patient:    [name], [name]                     MRN: [id number] Proc. Date: 06/16/00 Attending:  Verlin Grills, M.D. CC:         Pearla Dubonnet, M.D.                           Procedure Report  REFERRING PHYSICIAN:  Pearla Dubonnet, M.D.  PROCEDURE:  Colonoscopy with biopsies.  INDICATIONS:  [name]. Rehfeldt (date of birth [birth year]) is a 55 year old male.  He underwent a colonoscopy June 17, 1995.  Neoplastic polyps were removed from the rectosigmoid colon.  He is due for surveillance colonoscopy and possible polypectomy to prevent colon cancer.  I discussed with [name], the complications associated with colonoscopy and polypectomy including intestinal bleeding and intestinal perforation.  [name] has signed the operative permit.  ENDOSCOPIST:  Charolett Bumpers, M.D.  PREMEDICATION:  Fentanyl 50 mcg, Versed 5 mg.  ENDOSCOPE:  Olympus pediatric colonoscope.  PROCEDURE:  After obtaining informed consent, the patient was placed in the left lateral decubitus position.  I administered intravenous fentanyl and intravenous Versed to achieve conscious sedation for the procedure.  The patients blood pressure, oxygen saturation and cardiac rhythm were monitored throughout the procedure and documented in the medical record.  Anal inspection was normal.  Digital rectal examination revealed an enlarged, but nonnodular prostate.  The Olympus pediatric video colonoscope was introduced into the rectum and under direct vision advanced to the cecum as identified by normal-appearing ileocecal valve.  The ileocecal valve was intubated and the distal ileum inspected.  Colonic preparation for the examination today was excellent.  RECTUM:  From the mid rectum, a 0.5 mm sessile polyp was removed with the cold-biopsy forceps.  SIGMOID COLON AND DESCENDING COLON:  Normal.  SPLENIC FLEXURE:   Normal.  TRANSVERSE COLON:  Normal.  HEPATIC FLEXURE:  Normal.  ASCENDING COLON:  Normal.  CECUM AND ILEOCECAL VALVE:  Normal.  ASSESSMENT:  A 0.5 mm sessile polyp was removed from the rectum; otherwise normal proctocolonoscopy to the cecum with intubation of the ileocecal valve and distal ileal inspection.  RECOMMENDATIONS:  Repeat colonoscopy in five years. DD:  06/16/00 TD:  06/16/00 Job: [id number]

## 2010-10-13 ENCOUNTER — Encounter: Payer: Self-pay | Admitting: Cardiovascular Disease

## 2011-06-30 ENCOUNTER — Other Ambulatory Visit: Payer: 59

## 2011-07-02 ENCOUNTER — Ambulatory Visit (INDEPENDENT_AMBULATORY_CARE_PROVIDER_SITE_OTHER): Payer: 59 | Admitting: *Deleted

## 2011-07-02 DIAGNOSIS — Z48812 Encounter for surgical aftercare following surgery on the circulatory system: Secondary | ICD-10-CM

## 2011-07-02 DIAGNOSIS — I6529 Occlusion and stenosis of unspecified carotid artery: Secondary | ICD-10-CM

## 2011-07-06 ENCOUNTER — Other Ambulatory Visit: Payer: Self-pay | Admitting: *Deleted

## 2011-07-06 DIAGNOSIS — I6529 Occlusion and stenosis of unspecified carotid artery: Secondary | ICD-10-CM

## 2011-07-06 DIAGNOSIS — Z48812 Encounter for surgical aftercare following surgery on the circulatory system: Secondary | ICD-10-CM

## 2011-07-07 ENCOUNTER — Encounter: Payer: Self-pay | Admitting: Vascular Surgery

## 2011-07-07 NOTE — Procedures (Unsigned)
CAROTID DUPLEX EXAM  INDICATION:  Carotid stenosis  HISTORY: Diabetes:  No Cardiac:  Coronary artery disease Hypertension:  Yes Smoking:  Current Previous Surgery:  Right CEA 06/12/2009 CV History:  January 2011 Amaurosis Fugax No, Paresthesias No, Hemiparesis No                                      RIGHT             LEFT Brachial systolic pressure:         111               119 Brachial Doppler waveforms:         Normal            Normal Vertebral direction of flow:        Antegrade         Antegrade DUPLEX VELOCITIES (cm/sec) CCA peak systolic                   71                75 ECA peak systolic                   85                122 ICA peak systolic                   56                109 ICA end diastolic                   26                40 PLAQUE MORPHOLOGY:                  Intimal wall thickening, soft       Heterogeneous PLAQUE AMOUNT:                      Minimal           Moderate PLAQUE LOCATION:                    CEA site, CCA     CCA, ICA  IMPRESSION: 1. Patent right carotid endarterectomy site with what appears to be a     new formation of soft plaque present at the site. 2. Left internal carotid artery velocity suggests 40%-59% stenosis. 3. Antegrade vertebral arteries bilaterally.  ___________________________________________ Larina Earthly, M.D.  EM/MEDQ  D:  07/02/2011  T:  07/02/2011  Job:  [id number]

## 2012-06-23 ENCOUNTER — Other Ambulatory Visit: Payer: Self-pay | Admitting: *Deleted

## 2012-06-26 ENCOUNTER — Other Ambulatory Visit: Payer: Self-pay

## 2012-06-27 ENCOUNTER — Ambulatory Visit: Payer: 59 | Admitting: Neurosurgery

## 2012-06-27 ENCOUNTER — Other Ambulatory Visit: Payer: 59

## 2012-07-25 ENCOUNTER — Ambulatory Visit: Payer: 59 | Admitting: Vascular Surgery

## 2012-07-25 ENCOUNTER — Other Ambulatory Visit: Payer: 59

## 2012-09-11 ENCOUNTER — Encounter: Payer: Self-pay | Admitting: Vascular Surgery

## 2012-09-12 ENCOUNTER — Encounter: Payer: Self-pay | Admitting: Vascular Surgery

## 2012-09-12 ENCOUNTER — Other Ambulatory Visit: Payer: Self-pay | Admitting: *Deleted

## 2012-09-12 ENCOUNTER — Ambulatory Visit (INDEPENDENT_AMBULATORY_CARE_PROVIDER_SITE_OTHER): Payer: 59 | Admitting: Vascular Surgery

## 2012-09-12 ENCOUNTER — Other Ambulatory Visit (INDEPENDENT_AMBULATORY_CARE_PROVIDER_SITE_OTHER): Payer: 59 | Admitting: *Deleted

## 2012-09-12 DIAGNOSIS — I6529 Occlusion and stenosis of unspecified carotid artery: Secondary | ICD-10-CM

## 2012-09-12 DIAGNOSIS — Z48812 Encounter for surgical aftercare following surgery on the circulatory system: Secondary | ICD-10-CM

## 2012-09-12 NOTE — Progress Notes (Signed)
[facility] [place] HISTORY AND PHYSICAL   CC:  Follow up carotid duplex scan  Referring Provider:  Marden Noble, MD  HPI: This is a 57 y.o. male who has known carotid stenosis is here for f/u carotid duplex scan.  Denies amaurosis fugax, paresthesias, or hemiparesis.  He had a previous CVA in March 2011.  At that time, it was discovered he had right ICA stenosis.  He underwent a right CEA by Dr. Arbie Cookey and has done well since then.  He states that he continues to smoke 1-2 ppd.  He states that the numbness under his right jaw and at his incision has greatly improved.  He is on a statin but was taken off of ASA and was placed on something else, but does not know the name of the drug.  He denies any CP or hx of MI.  Past Medical History  Diagnosis Date  . Hemorrhage 1996    intracerebral  . CVA (cerebral infarction) 05/27/09    TIA, posterior circulation 7/11  . HTN (hypertension)   . HLD (hyperlipidemia)   . ICAO (internal carotid artery occlusion)     right ICA stenosis, s/p R carotid endarterectomy on 06/13/09  . Artery stenosis     L carotid  . Glaucoma   . Seizure disorder     at time of ICH in 1996.   . Meningitis     viral; as a child   . Stroke March 2011  . Myocardial infarction March 2011    After surgery     Past Surgical History  Procedure Laterality Date  . R inguinal hernia repair  07/05/09  . Colonoscopy  06/16/01    by Dr. Danise Edge. positive for colonic polyp, non-adenomatous   . R cea  3/11  . Carotid endarterectomy Right 06/12/09    cea    Not on File  Current Outpatient Prescriptions  Medication Sig Dispense Refill  . budesonide-formoterol (SYMBICORT) 80-4.5 MCG/ACT inhaler Inhale 2 puffs into the lungs daily.        . carbamazepine (TEGRETOL XR) 400 MG 12 hr tablet Take 1,600 mg by mouth daily.        . Cholecalciferol (VITAMIN D) 2000 UNITS tablet Take 2,000 Units by mouth daily.        . rosuvastatin (CRESTOR) 10 MG tablet Take  10 mg by mouth daily.        . sertraline (ZOLOFT) 50 MG tablet Take 50 mg by mouth daily.        . valsartan-hydrochlorothiazide (DIOVAN-HCT) 160-25 MG per tablet Take 1 tablet by mouth daily.        . tadalafil (CIALIS) 10 MG tablet Take 10 mg by mouth as needed.        . timolol (TIMOPTIC) 0.25 % ophthalmic solution UAD        No current facility-administered medications for this visit.    Family History  Problem Relation Age of Onset  . Heart disease Father     Heart Disease before age 54  . Heart attack Father   . Heart disease Mother   . Hyperlipidemia Brother   . Hyperlipidemia Sister     History   Social History  . Marital Status: Married    Spouse Name: N/A    Number of Children: N/A  . Years of Education: N/A   Occupational History  . Not on file.   Social History Main Topics  . Smoking status: Current Every Day Smoker  .  Smokeless tobacco: Current User    Types: Chew     Comment: smoked 1 ppd for 30-40 years. (tobacco dependance)  . 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 Drug Use: No  . Sexually Active: Not on file   Other Topics Concern  . Not on file   Social History Narrative   Married, lives in [place] with his wife.   2 children (daughter, Shanda Bumps, is a Engineer, civil (consulting) on [employer] at [employer])            ROS: [x]  Positive   [ ]  Negative   [ x] All sytems reviewed and are negative  General: [ ]  Weight loss, [ ]  Fever, [ ]  chills Neurologic: [ ]  Dizziness, [ ]  Blackouts, [ ]  Seizure [ ]  Stroke, [ ]  "Mini stroke", [ ]  Slurred speech, [ ]  Temporary blindness; [ ]  weakness in arms or legs, [ ]  Hoarseness Cardiac: [ ]  Chest pain/pressure, [ ]  Shortness of breath at rest [ ]  Shortness of breath with exertion, [ ]  Atrial fibrillation or irregular heartbeat Vascular: [ ]  Pain in legs with walking, [ ]  Pain in legs at rest, [ ]  Pain in legs at night,  [ ]  Non-healing ulcer, [ ]  Blood clot in vein/DVT,    Pulmonary: [ ]  Home oxygen, [ ]  Productive cough, [ ]  Coughing up blood, [ ]  Asthma,  [ ]  Wheezing Musculoskeletal:  [ ]  Arthritis, [ ]  Low back pain, [ ]  Joint pain Hematologic: [ ]  Easy Bruising, [ ]  Anemia; [ ]  Hepatitis Gastrointestinal: [ ]  Blood in stool, [ ]  Gastroesophageal Reflux/heartburn, [ ]  Trouble swallowing Urinary: [ ]  chronic Kidney disease, [ ]  on HD - [ ]  MWF or [ ]  TTHS, [ ]  Burning with urination, [ ]  Difficulty urinating Endocrine: [ ]  hx of diabetes, [ ]  hx of thyroid disease Skin: [ ]  Rashes, [ ]  Wounds Psychological: [ ]  Anxiety, [ ]  Depression   PHYSICAL EXAMINATION:  Filed Vitals:   09/12/12 1430  BP: 131/81  Pulse: 65  Resp:    Body mass index is 23.87 kg/(m^2).  General:  WDWN in NAD Gait: Normal HENT: WNL; normocephalic Eyes: PERRL Pulmonary: normal non-labored breathing , without Rales, rhonchi,  wheezing Cardiac: RRR, without  Murmurs, rubs or gallops; with bilateral carotid bruits Abdomen: soft, NT, no masses Skin: with rashes,  ulcers  Vascular Exam/Pulses: bilateral radial pulses are palpable Extremities: without ischemic changes, without Gangrene , without cellulitis; without open wounds;  Musculoskeletal: without muscle wasting or atrophy  Neurologic: A&O X 3; Appropriate Affect ; SENSATION: normal; MOTOR FUNCTION:  moving all extremities equally. Speech is fluent/normal   Non-Invasive Vascular Imaging: Carotid Duplex Scan:  09/12/2012  1.  Patent right CEA site with what appears to be a new formation of soft plaque present at the site 2.  Left ICA velocity suggests 40-59% stenosis  ASSESSMENT/PLAN: 57 y.o. male here for f/u carotid duplex scan.   -pt is doing well without CVA symptoms -spoke with him the importance of smoking cessation -will f/u in one year with carotid duplex -continue statin -he will call sooner if he has any issues   Doreatha Massed, PA-C [facility] [phone]  Clinic MD:   Pt seen and  examined in conjunction with Dr. Arbie Cookey  For VQI Use Only    PRE-ADM LIVING: [x ] Home, [ ]  Nursing home, [ ]  Homeless  AMB STATUS: [  x] Walking, [ ]  Walking w/ Assistance, [ ]  Wheelchair, [ ] Bed ridden  RECENT HEART ATTACK (<6 mon): No  CAD Sx: [ x] No, [ ]  Asx, h/o MI, [ ]  Stable angina, [ ]  Unstable angina  PRIOR CHF: [ x] No, [ ]  Asx, [ ]  Mild, [ ]  Moderate, [ ]  Severe  STRESS TEST: [ x] No, [ ]  Normal, [ ]  + ischemia, [ ]  + MI, [ ]  Both  I have examined the patient, reviewed and agree with above.  EARLY, TODD, MD 09/15/2012 7:20 AM

## 2012-09-18 ENCOUNTER — Telehealth: Payer: Self-pay | Admitting: Neurology

## 2012-09-18 MED ORDER — CARBAMAZEPINE ER 400 MG PO TB12: 400.0000 mg | ORAL_TABLET | Freq: Four times a day (QID) | ORAL | Status: DC

## 2012-09-18 NOTE — Telephone Encounter (Signed)
Rx sent 

## 2012-09-21 ENCOUNTER — Ambulatory Visit (INDEPENDENT_AMBULATORY_CARE_PROVIDER_SITE_OTHER): Payer: 59 | Admitting: Nurse Practitioner

## 2012-09-21 ENCOUNTER — Encounter: Payer: Self-pay | Admitting: Nurse Practitioner

## 2012-09-21 VITALS — BP 117/69 | HR 78 | Ht 73.0 in | Wt 184.0 lb

## 2012-09-21 DIAGNOSIS — R0989 Other specified symptoms and signs involving the circulatory and respiratory systems: Secondary | ICD-10-CM

## 2012-09-21 DIAGNOSIS — I10 Essential (primary) hypertension: Secondary | ICD-10-CM

## 2012-09-21 DIAGNOSIS — F17200 Nicotine dependence, unspecified, uncomplicated: Secondary | ICD-10-CM

## 2012-09-21 DIAGNOSIS — E785 Hyperlipidemia, unspecified: Secondary | ICD-10-CM

## 2012-09-21 DIAGNOSIS — Z8679 Personal history of other diseases of the circulatory system: Secondary | ICD-10-CM

## 2012-09-21 DIAGNOSIS — Z8669 Personal history of other diseases of the nervous system and sense organs: Secondary | ICD-10-CM

## 2012-09-21 MED ORDER — ASPIRIN-DIPYRIDAMOLE ER 25-200 MG PO CP12: 1.0000 | ORAL_CAPSULE | Freq: Two times a day (BID) | ORAL | Status: DC

## 2012-09-21 MED ORDER — CARBAMAZEPINE ER 400 MG PO TB12: 400.0000 mg | ORAL_TABLET | Freq: Four times a day (QID) | ORAL | Status: DC

## 2012-09-21 NOTE — Progress Notes (Signed)
HPI: Patient returns for followup after his last visit of 07/20/2011. He has a history of posterior circulatory TIA in September of 2011 with vascular risk factors of smoking, hypertension, hyperlipidemia, and remote brain intracranial hemorrhage in 1996. He also has had a seizure disorder since 1978 following encephalitis with breakthrough seizures occurring following a brain hemorrhage in 1996 currently well controlled on Tegretol. In addition he has history of right subcortical infarct in February of 2011 right carotid endarterectomy in March of 2011 He was initially started on Plavix but had diarrhea on the drug. He is now on Aggrenox tolerating without headache or other side effects.  No further stroke or TIA symptoms since last seen. Recent carotid duplex with Dr. Arbie Cookey 09/12/2012 with left ICA 40-59% stenosis. He is to get that repeated next year  ROS:  Negative  Physical Exam General: well developed, well nourished, seated, in no evident distress Head: head normocephalic and atraumatic. Oropharynx benign Neck: supple with bil carotid  bruits. Right CEA scar. Cardiovascular: regular rate and rhythm, no murmurs  Neurologic Exam Mental Status: Awake and fully alert. Oriented to place and time. Follows all commands. Speech and language normal.   Cranial Nerves: Fundoscopic exam reveals sharp disc margins. Pupils equal, briskly reactive to light. Extraocular movements full without nystagmus. Visual fields full to confrontation. Hearing intact and symmetric to finger snap. Facial sensation intact. Face, tongue, palate move normally and symmetrically. Neck flexion and extension normal.  Motor: Normal bulk and tone. Normal strength in all tested extremity muscles.No focal weakness Sensory.: intact to touch and pinprick and vibratory.  Coordination: Rapid alternating movements normal in all extremities. Finger-to-nose and heel-to-shin performed accurately bilaterally. Gait and Station: Arises from  chair without difficulty. Stance is normal. Gait demonstrates normal stride length and balance . Able to heel, toe and tandem walk without difficulty.  Reflexes: 2+ and symmetric. Toes downgoing.     ASSESSMENT: History of posterior circulatory TIA in September 2011 vascular risk factors of smoking, hypertension, hyperlipidemia, and remote brain injury cerebral hemorrhage in 1996. Seizure disorder since 1978, following encephalitis and breakthrough seizures following brain hemorrhage in 1996 now well controlled on Tegretol. Right subcortical infarct in February of 2011 with right carotid endarterectomy in March 2011.     PLAN: Continue Aggrenox for secondary stroke prevention Systolic blood pressure goal 130, excellent B/P today LDL below 100 Encouraged smoking cessation to decrease risk factors Continue Tegretol XL 400 4 times a day just renewed Carotid duplex followed by Dr. Arbie Cookey Followup in one year, Dr. Kevan Ny follows labs   Nilda Riggs, [facility] APRN

## 2012-09-21 NOTE — Patient Instructions (Addendum)
Continue Aggrenox secondary stroke prevention Systolic blood pressure goal 130 LDL below 100 Smoking cessation to decrease risk factor Continue Tegretol XL 400 4 times a day just renewed Followup in one year

## 2013-01-11 ENCOUNTER — Telehealth: Payer: Self-pay | Admitting: Oncology

## 2013-01-11 NOTE — Telephone Encounter (Signed)
LVOM FOR PT TO RETURN CALL IN RE TO REFERRAL.  °

## 2013-01-12 ENCOUNTER — Telehealth: Payer: Self-pay | Admitting: Oncology

## 2013-01-12 NOTE — Telephone Encounter (Signed)
C/D 01/12/13 for appt.02/07/13

## 2013-01-12 NOTE — Telephone Encounter (Signed)
S/W PT AND GVE NP APPT 10/29 @ 9:30 W/DR. GRANFORTUNA REFERRING DR. Molly Maduro [facility] DX- MONOCLONAL GAMMOPATHY WELCOME PACKET MAILED.

## 2013-02-07 ENCOUNTER — Ambulatory Visit: Payer: 59

## 2013-02-07 ENCOUNTER — Encounter: Payer: Self-pay | Admitting: Oncology

## 2013-02-07 ENCOUNTER — Encounter: Payer: 59 | Admitting: Oncology

## 2013-02-07 ENCOUNTER — Other Ambulatory Visit: Payer: Self-pay | Admitting: Oncology

## 2013-02-07 DIAGNOSIS — D472 Monoclonal gammopathy: Secondary | ICD-10-CM | POA: Insufficient documentation

## 2013-02-07 HISTORY — DX: Monoclonal gammopathy: D47.2

## 2013-05-22 ENCOUNTER — Other Ambulatory Visit: Payer: Self-pay | Admitting: Vascular Surgery

## 2013-05-22 DIAGNOSIS — I6529 Occlusion and stenosis of unspecified carotid artery: Secondary | ICD-10-CM

## 2013-05-22 DIAGNOSIS — Z48812 Encounter for surgical aftercare following surgery on the circulatory system: Secondary | ICD-10-CM

## 2013-07-19 ENCOUNTER — Telehealth: Payer: Self-pay | Admitting: Neurology

## 2013-07-19 MED ORDER — CARBAMAZEPINE ER 400 MG PO TB12: 400.0000 mg | ORAL_TABLET | Freq: Four times a day (QID) | ORAL | Status: DC

## 2013-07-19 NOTE — Telephone Encounter (Signed)
Pt called and stated that he needed a refill of carbamazepine (TEGRETOL XR) 400 MG 12 hr tablet. He needs this to go to [facility].  Thank you.

## 2013-07-19 NOTE — Telephone Encounter (Signed)
Rx as been sent  

## 2013-08-20 ENCOUNTER — Encounter: Payer: Self-pay | Admitting: Nurse Practitioner

## 2013-09-10 ENCOUNTER — Other Ambulatory Visit: Payer: Self-pay | Admitting: Neurology

## 2013-09-17 ENCOUNTER — Encounter: Payer: Self-pay | Admitting: Vascular Surgery

## 2013-09-18 ENCOUNTER — Encounter: Payer: Self-pay | Admitting: Vascular Surgery

## 2013-09-18 ENCOUNTER — Ambulatory Visit (HOSPITAL_COMMUNITY)
Admission: RE | Admit: 2013-09-18 | Discharge: 2013-09-18 | Disposition: A | Discharge status: Home / Self Care | Payer: 59 | Source: Ambulatory Visit | Attending: Vascular Surgery | Admitting: Vascular Surgery

## 2013-09-18 ENCOUNTER — Ambulatory Visit (INDEPENDENT_AMBULATORY_CARE_PROVIDER_SITE_OTHER): Payer: 59 | Admitting: Vascular Surgery

## 2013-09-18 VITALS — BP 134/88 | HR 59 | Resp 18 | Ht 74.0 in | Wt 184.0 lb

## 2013-09-18 DIAGNOSIS — I6529 Occlusion and stenosis of unspecified carotid artery: Secondary | ICD-10-CM

## 2013-09-18 DIAGNOSIS — Z48812 Encounter for surgical aftercare following surgery on the circulatory system: Secondary | ICD-10-CM

## 2013-09-18 NOTE — Addendum Note (Signed)
Addended by: Mena Goes on: 09/18/2013 04:14 PM   Modules accepted: Orders

## 2013-09-18 NOTE — Progress Notes (Signed)
Patient has today for continued followup of extracranial cerebrovascular occlusive disease. He is status post right carotid endarterectomy in March 2011. He has preop mild stroke. His had no neurologic deficits since the surgery. He is right-handed.  Past Medical History  Diagnosis Date  . Hemorrhage 1996    intracerebral  . CVA (cerebral infarction) 05/27/09    TIA, posterior circulation 7/11  . HTN (hypertension)   . HLD (hyperlipidemia)   . ICAO (internal carotid artery occlusion)     right ICA stenosis, s/p R carotid endarterectomy on 06/13/09  . Artery stenosis     L carotid  . Glaucoma   . Seizure disorder     at time of ICH in 1996.   . Meningitis     viral; as a child   . Stroke March 2011  . Myocardial infarction March 2011    After surgery    . Monoclonal gammopathy of unknown significance 02/07/2013    No anemia,no elevation of Iggs,  no m-spike, ?monoclonal protein on IFE 9/14    History  Substance Use Topics  . Smoking status: Current Every Day Smoker    Types: Cigarettes  . Smokeless tobacco: Current User    Types: Chew     Comment: smoked 1 ppd for 30-40 years. (tobacco dependance)  . 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 Heart disease Father     Heart Disease before age 89  . Heart attack Father   . Heart disease Mother   . Hyperlipidemia Brother   . Hyperlipidemia Sister     No Known Allergies  Current outpatient prescriptions:budesonide-formoterol (SYMBICORT) 80-4.5 MCG/ACT inhaler, Inhale 2 puffs into the lungs daily.  , Disp: , Rfl: ;  Cholecalciferol (VITAMIN D) 2000 UNITS tablet, Take 2,000 Units by mouth daily.  , Disp: , Rfl: ;  dipyridamole-aspirin (AGGRENOX) 200-25 MG per 12 hr capsule, Take 1 capsule by mouth 2 (two) times daily., Disp: 180 capsule, Rfl: 3 rosuvastatin (CRESTOR) 10 MG tablet, Take 10 mg by mouth daily.  , Disp:  , Rfl: ;  sertraline (ZOLOFT) 50 MG tablet, Take 50 mg by mouth daily.  , Disp: , Rfl: ;  tadalafil (CIALIS) 10 MG tablet, Take 10 mg by mouth as needed.  , Disp: , Rfl: ;  TEGRETOL-XR 400 MG 12 hr tablet, Take 1 tablet by mouth 4  times daily., Disp: 360 tablet, Rfl: 0;  timolol (TIMOPTIC) 0.25 % ophthalmic solution, UAD , Disp: , Rfl:  valsartan-hydrochlorothiazide (DIOVAN-HCT) 160-25 MG per tablet, Take 1 tablet by mouth daily.  , Disp: , Rfl:   BP 134/88  Pulse 59  Resp 18  Ht 6\' 2"  (1.88 m)  Wt 184 lb (83.462 kg)  BMI 23.61 kg/m2  Body mass index is 23.61 kg/(m^2).       Physical exam: Well-developed well-nourished gentleman in no acute distress Neurologically he is grossly intact Right neck incision is well-healed Soft carotid bruits bilaterally 2+ radial pulses bilaterally Skin without ulcers or rashes Heart regular rate and rhythm without murmur Respirations equal and nonlabored with no wheezes  Carotid duplex today reveals widely patent endarterectomy on the right with no evidence of stenosis. On the left there is slight progression since his last study one year ago. He has a lower end of the 60-79% stenosis in his left internal carotid artery.  Impression  and plan stable status post right carotid endarterectomy 2011. Does have some slight progression of stenosis which is asymptomatic and is dominant left carotid artery. Again explained the critical importance of smoking cessation with the patient. Again reviewed symptoms of carotid disease. He'll notify us immediately should this occur He will see [place] again in one year with a carotid duplex. Explained if he had any progression we would recommend increasing to 6 month interval.

## 2013-09-24 ENCOUNTER — Ambulatory Visit: Payer: 59 | Admitting: Nurse Practitioner

## 2013-11-13 ENCOUNTER — Encounter: Payer: Self-pay | Admitting: Nurse Practitioner

## 2013-11-13 ENCOUNTER — Ambulatory Visit (INDEPENDENT_AMBULATORY_CARE_PROVIDER_SITE_OTHER): Payer: 59 | Admitting: Nurse Practitioner

## 2013-11-13 VITALS — BP 118/69 | HR 70 | Ht 74.0 in | Wt 181.0 lb

## 2013-11-13 DIAGNOSIS — G40409 Other generalized epilepsy and epileptic syndromes, not intractable, without status epilepticus: Secondary | ICD-10-CM

## 2013-11-13 DIAGNOSIS — R0989 Other specified symptoms and signs involving the circulatory and respiratory systems: Secondary | ICD-10-CM

## 2013-11-13 DIAGNOSIS — G40309 Generalized idiopathic epilepsy and epileptic syndromes, not intractable, without status epilepticus: Secondary | ICD-10-CM

## 2013-11-13 DIAGNOSIS — I6529 Occlusion and stenosis of unspecified carotid artery: Secondary | ICD-10-CM

## 2013-11-13 MED ORDER — TEGRETOL-XR 400 MG PO TB12: 400.0000 mg | ORAL_TABLET | Freq: Four times a day (QID) | ORAL | Status: DC

## 2013-11-13 NOTE — Patient Instructions (Addendum)
Aspirin 81 daily for secondary stroke prevention Excellent Blood pressure today 118/69 Keep  LDL below 100 Continue Tegretol XL 400 4 times daily will refill Carotid Doppler to be done by Dr. Donnetta Hutching Followup in one year

## 2013-11-13 NOTE — Progress Notes (Signed)
[facility]  PATIENT: [name] DOB: Sep 08, 1955   REASON FOR VISIT: follow up for stroke and seizure disorder   HISTORY OF PRESENT ILLNESS: [name], 58 year old male returns for followup. He was last seen in this office 09/21/2012.He has a history of posterior circulatory TIA in September of 2011 with vascular risk factors of smoking, hypertension, hyperlipidemia, and remote brain intracranial hemorrhage in 1996. He also has had a seizure disorder since 1978 following encephalitis with breakthrough seizures occurring following a brain hemorrhage in 1996 currently well controlled on Tegretol. In addition he has history of right subcortical infarct in February of 2011 right carotid endarterectomy in March of 2011 He was initially started on Plavix but had diarrhea on the drug. He was on Aggrenox when last seen however stopped the drug due to the expense of greater than $200 a month. He did not call to the office to let us know.  He denies further stroke or TIA symptoms since last seen. Carotid duplex with Dr. Donnetta [name] 09/12/2012 with left ICA 40-59% stenosis.  He claims he just had another 2 weeks ago however I do not see that report. He returns for reevaluation      REVIEW OF SYSTEMS: Full 14 system review of systems performed and notable only for those listed, all others are neg:  Constitutional: N/A  Cardiovascular: N/A  Ear/Nose/Throat:  ringing in the ears  Skin: N/A  Eyes: N/A  Respiratory: N/A  Gastroitestinal: N/A  Hematology/Lymphatic: N/A  Endocrine: N/A Musculoskeletal:N/A  Allergy/Immunology: N/A  Neurological: N/A Psychiatric: N/A Sleep : NA   ALLERGIES: No Known Allergies  HOME MEDICATIONS: Outpatient Prescriptions Prior to Visit  Medication Sig Dispense Refill  . Cholecalciferol (VITAMIN D) 2000 UNITS tablet Take 2,000 Units by mouth daily.        Marland Kitchen dipyridamole-aspirin (AGGRENOX) 200-25 MG per 12 hr capsule Take 1 capsule by mouth 2  (two) times daily.  180 capsule  3  . rosuvastatin (CRESTOR) 10 MG tablet Take 10 mg by mouth daily.        . sertraline (ZOLOFT) 50 MG tablet Take 50 mg by mouth daily.        . tadalafil (CIALIS) 10 MG tablet Take 10 mg by mouth as needed.        . TEGRETOL-XR 400 MG 12 hr tablet Take 1 tablet by mouth 4  times daily.  360 tablet  0  . timolol (TIMOPTIC) 0.25 % ophthalmic solution Place 1 drop into both eyes daily as needed. UAD      . valsartan-hydrochlorothiazide (DIOVAN-HCT) 160-25 MG per tablet Take 1 tablet by mouth daily.        . budesonide-formoterol (SYMBICORT) 80-4.5 MCG/ACT inhaler Inhale 2 puffs into the lungs as needed.        No facility-administered medications prior to visit.    PAST MEDICAL HISTORY: Past Medical History  Diagnosis Date  . Hemorrhage 1996    intracerebral  . CVA (cerebral infarction) 05/27/09    TIA, posterior circulation 7/11  . HTN (hypertension)   . HLD (hyperlipidemia)   . ICAO (internal carotid artery occlusion)     right ICA stenosis, s/p R carotid endarterectomy on 06/13/09  . Artery stenosis     L carotid  . Glaucoma   . Seizure disorder     at time of ICH in 1996.   . Meningitis     viral; as a child   . Stroke March 2011  . Myocardial infarction March 2011  After surgery    . Monoclonal gammopathy of unknown significance 02/07/2013    No anemia,no elevation of Iggs,  no m-spike, ?monoclonal protein on IFE 9/14    PAST SURGICAL HISTORY: Past Surgical History  Procedure Laterality Date  . R inguinal hernia repair  07/05/09  . Colonoscopy  06/16/01    by Dr. Earle [name]. positive for colonic polyp, non-adenomatous   . R cea  3/11  . Carotid endarterectomy Right 06/12/09    cea    FAMILY HISTORY: Family History  Problem Relation Age of Onset  . Heart disease Father     Heart Disease before age 71  . Heart attack Father   . Heart disease Mother   . Hyperlipidemia Brother   . Hyperlipidemia Sister     SOCIAL  HISTORY: History   Social History  . Marital Status: Married    Spouse Name: N/A    Number of Children: 2  . Years of Education: 12+   Occupational History  . ELECTRICIAN    Social History Main Topics  . Smoking status: Current Every Day Smoker    Types: Cigarettes  . Smokeless tobacco: Current User    Types: Chew     Comment: smoked 1 ppd for 30-40 years. (tobacco dependance)  . 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 Drug Use: No  . Sexual Activity: Not on file   Other Topics Concern  . Not on file   Social History Narrative   Married, lives in [place] with his wife.   2 children (daughter, [name], is a Marine scientist on [employer] at [employer])    Patient is right handed   Patient has a high school education with some college.   Patient drinks at least 5 cups daily.              PHYSICAL EXAM  Filed Vitals:   11/13/13 1356  BP: 118/69  Pulse: 70  Height: 6\' 2"  (1.88 m)  Weight: 181 lb (82.101 kg)   Body mass index is 23.23 kg/(m^2). General: well developed, well nourished, seated, in no evident distress  Head: head normocephalic and atraumatic. Oropharynx benign  Neck: supple with bil carotid bruits. Right CEA scar.  Cardiovascular: regular rate and rhythm, no murmurs  Neurologic Exam  Mental Status: Awake and fully alert. Oriented to place and time. Follows all commands. Speech and language normal.  Cranial Nerves:  Pupils equal, briskly reactive to light. Extraocular movements full without nystagmus. Visual fields full to confrontation. Hearing intact and symmetric to finger snap. Facial sensation intact. Face, tongue, palate move normally and symmetrically. Neck flexion and extension normal.  Motor: Normal bulk and tone. Normal strength in all tested extremity muscles.No focal weakness  Sensory.: intact to touch and pinprick and vibratory.  Coordination: Rapid alternating movements normal in all  extremities. Finger-to-nose and heel-to-shin performed accurately bilaterally.  Gait and Station: Arises from chair without difficulty. Stance is normal. Gait demonstrates normal stride length and balance . Able to heel, toe and tandem walk without difficulty.  Reflexes: 2+ and symmetric. Toes downgoing.     DIAGNOSTIC DATA (LABS, IMAGING, TESTING) -   ASSESSMENT AND PLAN  58 y.o. year old male  has a past medical history of posterior circulatory TIA in September 2011 with vascular risk factors of smoking, hypertension, hyperlipidemia, and remote brain injury cerebral hemorrhage in 1996. Seizure disorder since 1978 following encephalitis and breakthrough seizures  following brain hemorrhage in 1996 now well controlled on Tegretol. Right subcortical infarct in February 2011 with right carotid endarterectomy March 2011. Patient has stopped his Aggrenox since last seen due to the expense of $200 a month.  Aspirin 81 daily for secondary stroke prevention Excellent Blood pressure today 118/69 Keep  LDL below 100 followed by Dr Inda Merlin Continue Tegretol XL 400 4 times daily will refill Carotid Doppler to be followed  by Dr. Donnetta [name] Smoking cessation  Followup in one year Dennie Bible, [facility], [facility], [facility] [address], [place], [place] [address] [phone]

## 2013-11-15 NOTE — Progress Notes (Signed)
I agree with the above plan 

## 2014-02-27 ENCOUNTER — Encounter: Payer: Self-pay | Admitting: Neurology

## 2014-03-05 ENCOUNTER — Encounter: Payer: Self-pay | Admitting: Neurology

## 2014-04-12 HISTORY — PX: GLAUCOMA REPAIR: SHX214

## 2014-06-25 ENCOUNTER — Telehealth: Payer: Self-pay | Admitting: Neurology

## 2014-06-25 NOTE — Telephone Encounter (Signed)
Medical clearance form completed and faxed. Patient last seen in office by CM on 11-13-13.

## 2014-06-25 NOTE — Telephone Encounter (Signed)
Troi with Dr Jeannine Kitten office is calling in regard to medical clearance for patient for cataract surgery on 06/27/14. Please call.

## 2014-06-26 NOTE — Telephone Encounter (Signed)
Form re-faxed to number given by Anson Fret. I spoke to [place] and she did receive it.

## 2014-06-26 NOTE — Telephone Encounter (Signed)
[name] with Dr. Marcelino Freestone office is calling because she did not receive the medical clearance for the patient that was faxed yesterday. Please refax to [phone]-Attention [name].Thank you

## 2014-06-26 NOTE — Telephone Encounter (Signed)
Troi with

## 2014-07-25 ENCOUNTER — Telehealth (HOSPITAL_COMMUNITY): Payer: Self-pay

## 2014-07-25 NOTE — Telephone Encounter (Signed)
Encounter complete. 

## 2014-07-29 ENCOUNTER — Other Ambulatory Visit (HOSPITAL_COMMUNITY): Payer: Self-pay | Admitting: Internal Medicine

## 2014-07-29 DIAGNOSIS — R079 Chest pain, unspecified: Secondary | ICD-10-CM

## 2014-07-30 ENCOUNTER — Telehealth (HOSPITAL_COMMUNITY): Payer: Self-pay

## 2014-07-30 NOTE — Telephone Encounter (Signed)
Encounter complete. 

## 2014-07-31 ENCOUNTER — Ambulatory Visit (HOSPITAL_COMMUNITY)
Admission: RE | Admit: 2014-07-31 | Discharge: 2014-07-31 | Disposition: A | Discharge status: Home / Self Care | Payer: 59 | Source: Ambulatory Visit | Attending: Cardiovascular Disease | Admitting: Cardiovascular Disease

## 2014-07-31 DIAGNOSIS — R079 Chest pain, unspecified: Secondary | ICD-10-CM | POA: Insufficient documentation

## 2014-07-31 NOTE — Procedures (Signed)
Exercise Treadmill Test  Pre-Exercise Testing Evaluation Rhythm: normal sinus                  Test  Exercise Tolerance Test Ordering MD: Viviana Simpler, MD    Unique Test No: 1  Treadmill:  1  Indication for ETT: chest pain - rule out ischemia  Contraindication to ETT: No   Stress Modality: exercise - treadmill  Cardiac Imaging Performed: non   Protocol: standard Bruce - maximal  Max BP:  217/140  Max MPHR (bpm):  160 85% MPR (bpm):  136  MPHR obtained (bpm):  142 % MPHR obtained:  88  Reached 85% MPHR (min:sec):  6:15 Total Exercise Time (min-sec):  7:30  Workload in METS:  9.3 Borg Scale: 16  Reason ETT Terminated:  SOB, Fatigue and Left Hip Pain Highest BP, took twice, lowest of 2 recorded   ST Segment Analysis At Rest: normal ST segments - no evidence of significant ST depression With Exercise: no evidence of significant ST depression  Other Information Arrhythmia:  No Angina during ETT:  There was no angina but signif SOB Quality of ETT:  diagnostic  ETT Interpretation:  normal - no evidence of ischemia by ST analysis  Comments: Nl GXT with marked SOB and hypertensive response  Recommendations: Follow up with Dr. Inda Merlin

## 2014-09-20 ENCOUNTER — Encounter: Payer: Self-pay | Admitting: Vascular Surgery

## 2014-09-24 ENCOUNTER — Ambulatory Visit: Payer: 59 | Admitting: Vascular Surgery

## 2014-09-24 ENCOUNTER — Encounter (HOSPITAL_COMMUNITY): Payer: 59

## 2014-11-06 ENCOUNTER — Other Ambulatory Visit: Payer: Self-pay | Admitting: Gastroenterology

## 2014-11-14 ENCOUNTER — Encounter: Payer: Self-pay | Admitting: Nurse Practitioner

## 2014-11-14 ENCOUNTER — Ambulatory Visit (INDEPENDENT_AMBULATORY_CARE_PROVIDER_SITE_OTHER): Payer: 59 | Admitting: Nurse Practitioner

## 2014-11-14 VITALS — BP 134/70 | HR 64 | Ht 72.0 in | Wt 187.8 lb

## 2014-11-14 DIAGNOSIS — I10 Essential (primary) hypertension: Secondary | ICD-10-CM

## 2014-11-14 DIAGNOSIS — I6529 Occlusion and stenosis of unspecified carotid artery: Secondary | ICD-10-CM

## 2014-11-14 DIAGNOSIS — E7800 Pure hypercholesterolemia, unspecified: Secondary | ICD-10-CM

## 2014-11-14 DIAGNOSIS — G40409 Other generalized epilepsy and epileptic syndromes, not intractable, without status epilepticus: Secondary | ICD-10-CM

## 2014-11-14 MED ORDER — ASPIRIN-DIPYRIDAMOLE ER 25-200 MG PO CP12: 1.0000 | ORAL_CAPSULE | Freq: Two times a day (BID) | ORAL | Status: DC

## 2014-11-14 MED ORDER — TEGRETOL-XR 400 MG PO TB12: 400.0000 mg | ORAL_TABLET | Freq: Four times a day (QID) | ORAL | Status: DC

## 2014-11-14 NOTE — Progress Notes (Signed)
[facility]  PATIENT: [name] DOB: [birth year]   REASON FOR VISIT: Follow-up for seizure disorder, history of stroke  HISTORY FROM: Patient    HISTORY OF PRESENT ILLNESS:[name], 59 year old male returns for followup. He was last seen in this office 11/13/13. Marland KitchenHe has a history of posterior circulatory TIA in September of 2011 with vascular risk factors of smoking, hypertension, hyperlipidemia, and remote brain intracranial hemorrhage in 1996. He also has had a seizure disorder since 1978 following encephalitis with breakthrough seizures occurring following a brain hemorrhage in 1996 currently well controlled on Tegretol. In addition he has history of right subcortical infarct in February of 2011 right carotid endarterectomy in March of 2011 He was initially started on Plavix but had diarrhea on the drug. He was on Aggrenox when last seen however stopped the drug due to the expense of greater than $200 a month. He has been on aspirin but wants to go  back on Aggrenox as he now has insurance He denies further stroke or TIA symptoms since last seen. Carotid duplex with Dr. Kellie Simmering 09/18/2013 with progression left ICA  stenosis.  Dopplers followed by CVTS. He returns for reevaluation     REVIEW OF SYSTEMS: Full 14 system review of systems performed and notable only for those listed, all others are neg:  Constitutional: neg  Cardiovascular: neg Ear/Nose/Throat: neg  Skin: neg Eyes: neg Respiratory: neg Gastroitestinal: neg  Hematology/Lymphatic: neg  Endocrine: neg Musculoskeletal:neg Allergy/Immunology: neg Neurological: History of seizure disorder and stroke Psychiatric: neg Sleep : neg   ALLERGIES: No Known Allergies  HOME MEDICATIONS: Outpatient Prescriptions Prior to Visit  Medication Sig Dispense Refill  . aspirin EC 81 MG tablet Take 81 mg by mouth daily.    . budesonide-formoterol (SYMBICORT) 80-4.5 MCG/ACT inhaler Inhale 2 puffs into the  lungs as needed.     . Cholecalciferol (VITAMIN D) 2000 UNITS tablet Take 2,000 Units by mouth daily.      . rosuvastatin (CRESTOR) 10 MG tablet Take 10 mg by mouth daily.      . sertraline (ZOLOFT) 50 MG tablet Take 50 mg by mouth daily.      . tadalafil (CIALIS) 10 MG tablet Take 10 mg by mouth as needed.      . TEGRETOL-XR 400 MG 12 hr tablet Take 1 tablet (400 mg total) by mouth 4 (four) times daily. 360 tablet 3  . valsartan-hydrochlorothiazide (DIOVAN-HCT) 160-25 MG per tablet Take 0.5 tablets by mouth daily.     Marland Kitchen dipyridamole-aspirin (AGGRENOX) 200-25 MG per 12 hr capsule Take 1 capsule by mouth 2 (two) times daily. (Patient not taking: Reported on 11/14/2014) 180 capsule 3  . timolol (TIMOPTIC) 0.25 % ophthalmic solution Place 1 drop into both eyes daily as needed. UAD     No facility-administered medications prior to visit.    PAST MEDICAL HISTORY: Past Medical History  Diagnosis Date  . Hemorrhage 1996    intracerebral  . CVA (cerebral infarction) 05/27/09    TIA, posterior circulation 7/11  . HTN (hypertension)   . HLD (hyperlipidemia)   . ICAO (internal carotid artery occlusion)     right ICA stenosis, s/p R carotid endarterectomy on 06/13/09  . Artery stenosis     L carotid  . Glaucoma   . Seizure disorder     at time of ICH in 1996.   . Meningitis     viral; as a child   . Stroke March 2011  . Myocardial infarction March 2011  After surgery    . Monoclonal gammopathy of unknown significance 02/07/2013    No anemia,no elevation of Iggs,  no m-spike, ?monoclonal protein on IFE 9/14    PAST SURGICAL HISTORY: Past Surgical History  Procedure Laterality Date  . R inguinal hernia repair  07/05/09  . Colonoscopy  06/16/01    by Dr. Earle Gell. positive for colonic polyp, non-adenomatous   . R cea  3/11  . Carotid endarterectomy Right 06/12/09    cea  . Glaucoma repair  2016    by taking out cataracts (bilateral)    FAMILY HISTORY: Family History  Problem  Relation Age of Onset  . Heart disease Father     Heart Disease before age 65  . Heart attack Father   . Heart disease Mother   . Hyperlipidemia Brother   . Hyperlipidemia Sister     SOCIAL HISTORY: History   Social History  . Marital Status: Married    Spouse Name: N/A  . Number of Children: 2  . Years of Education: 12+   Occupational History  . ELECTRICIAN    Social History Main Topics  . Smoking status: Current Every Day Smoker -- 1.50 packs/day    Types: Cigarettes  . Smokeless tobacco: Current User    Types: Chew     Comment: smoked 1 ppd for 30-40 years. (tobacco dependance)  . 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 Drug Use: No  . Sexual Activity: Not on file   Other Topics Concern  . Not on file   Social History Narrative   Married, lives in [place] with his wife.   2 children (daughter, Janett Billow, is a Marine scientist on [employer] at [employer])    Patient is right handed   Patient has a high school education with some college.   Patient drinks at least 5 cups daily.              PHYSICAL EXAM  Filed Vitals:   11/14/14 1325  BP: 134/70  Pulse: 64  Height: 6' (1.829 m)  Weight: 187 lb 12.8 oz (85.186 kg)   Body mass index is 25.46 kg/(m^2). General: well developed, well nourished, seated, in no evident distress  Head: head normocephalic and atraumatic. Oropharynx benign  Neck: supple with bil carotid bruits. Right CEA scar.  Cardiovascular: regular rate and rhythm, no murmurs  Neurologic Exam  Mental Status: Awake and fully alert. Oriented to place and time. Follows all commands. Speech and language normal.  Cranial Nerves: Pupils equal, briskly reactive to light. Extraocular movements full without nystagmus. Visual fields full to confrontation. Hearing intact and symmetric to finger snap. Facial sensation intact. Face, tongue, palate move  normally and symmetrically. Neck flexion and extension normal.  Motor: Normal bulk and tone. Normal strength in all tested extremity muscles.No focal weakness  Sensory.: intact to touch and pinprick and vibratory.  Coordination: Rapid alternating movements normal in all extremities. Finger-to-nose and heel-to-shin performed accurately bilaterally.  Gait and Station: Arises from chair without difficulty. Stance is normal. Gait demonstrates normal stride length and balance . Able to heel, toe and tandem walk without difficulty.  Reflexes: 2+ and symmetric. Toes downgoing.  DIAGNOSTIC DATA (LABS, IMAGING, TESTING)  ASSESSMENT AND PLAN  59 y.o. year old male  has a past medical history of Hemorrhage (1996); CVA (cerebral infarction) (05/27/09); HTN (hypertension); HLD (  hyperlipidemia); ICAO (internal carotid artery occlusion); Artery stenosis; Seizure disorder; Meningitis; Stroke (March 2011); Myocardial infarction (March 2011); and Monoclonal gammopathy of unknown significance (02/07/2013). here to follow-up. He has not had stroke or TIA symptoms. He has not had any seizure activity since last seen.  Continue Agggrenox at current dose will refill Keep LDL below 518 Keep Systolic blood pressure less than 130, today's reading 134/70 Continue Tegretol at current dose will refill Call for any seizure activity Carotid Dopplers followed by Dr. Kellie Simmering Stop smoking as this is an increased risk factor for stroke Send recent labs to my attention Follow-up in one year Dennie Bible, [facility], [facility], [facility] [address], [place], [place] [address] [phone]

## 2014-11-14 NOTE — Patient Instructions (Signed)
Continue Agggrenox at current dose will refill Keep LDL below 100 Continue Tegretol at current dose will refill Carotid Dopplers followed by Dr. early Follow-up in one year

## 2014-11-15 NOTE — Progress Notes (Signed)
I agree with the above plan 

## 2014-11-19 ENCOUNTER — Encounter (HOSPITAL_COMMUNITY): Payer: 59

## 2014-11-19 ENCOUNTER — Ambulatory Visit: Payer: 59 | Admitting: Vascular Surgery

## 2014-12-17 ENCOUNTER — Encounter (HOSPITAL_COMMUNITY): Payer: 59

## 2014-12-17 ENCOUNTER — Ambulatory Visit: Payer: 59 | Admitting: Vascular Surgery

## 2015-01-23 ENCOUNTER — Encounter (HOSPITAL_COMMUNITY): Payer: Self-pay | Admitting: *Deleted

## 2015-02-03 ENCOUNTER — Encounter (HOSPITAL_COMMUNITY): Payer: Self-pay | Admitting: Anesthesiology

## 2015-02-03 ENCOUNTER — Encounter (HOSPITAL_COMMUNITY)
Admission: RE | Disposition: A | Discharge status: Home / Self Care | Payer: Self-pay | Source: Ambulatory Visit | Attending: Gastroenterology

## 2015-02-03 ENCOUNTER — Ambulatory Visit (HOSPITAL_COMMUNITY): Payer: 59 | Admitting: Anesthesiology

## 2015-02-03 ENCOUNTER — Ambulatory Visit (HOSPITAL_COMMUNITY)
Admission: RE | Admit: 2015-02-03 | Discharge: 2015-02-03 | Disposition: A | Discharge status: Home / Self Care | Payer: 59 | Source: Ambulatory Visit | Attending: Gastroenterology | Admitting: Gastroenterology

## 2015-02-03 DIAGNOSIS — H409 Unspecified glaucoma: Secondary | ICD-10-CM | POA: Insufficient documentation

## 2015-02-03 DIAGNOSIS — G40909 Epilepsy, unspecified, not intractable, without status epilepticus: Secondary | ICD-10-CM | POA: Insufficient documentation

## 2015-02-03 DIAGNOSIS — D123 Benign neoplasm of transverse colon: Secondary | ICD-10-CM | POA: Insufficient documentation

## 2015-02-03 DIAGNOSIS — I739 Peripheral vascular disease, unspecified: Secondary | ICD-10-CM | POA: Diagnosis not present

## 2015-02-03 DIAGNOSIS — Z1211 Encounter for screening for malignant neoplasm of colon: Secondary | ICD-10-CM | POA: Diagnosis not present

## 2015-02-03 DIAGNOSIS — Z86010 Personal history of colonic polyps: Secondary | ICD-10-CM | POA: Diagnosis not present

## 2015-02-03 DIAGNOSIS — F17200 Nicotine dependence, unspecified, uncomplicated: Secondary | ICD-10-CM | POA: Insufficient documentation

## 2015-02-03 DIAGNOSIS — D122 Benign neoplasm of ascending colon: Secondary | ICD-10-CM | POA: Insufficient documentation

## 2015-02-03 DIAGNOSIS — K621 Rectal polyp: Secondary | ICD-10-CM | POA: Insufficient documentation

## 2015-02-03 DIAGNOSIS — I10 Essential (primary) hypertension: Secondary | ICD-10-CM | POA: Diagnosis not present

## 2015-02-03 DIAGNOSIS — I252 Old myocardial infarction: Secondary | ICD-10-CM | POA: Insufficient documentation

## 2015-02-03 DIAGNOSIS — Z8673 Personal history of transient ischemic attack (TIA), and cerebral infarction without residual deficits: Secondary | ICD-10-CM | POA: Insufficient documentation

## 2015-02-03 HISTORY — PX: COLONOSCOPY WITH PROPOFOL: SHX5780

## 2015-02-03 HISTORY — DX: Unspecified convulsions: R56.9

## 2015-02-03 SURGERY — COLONOSCOPY WITH PROPOFOL
Anesthesia: Monitor Anesthesia Care

## 2015-02-03 MED ORDER — LACTATED RINGERS IV SOLN
INTRAVENOUS | Status: DC
  Administered 2015-02-03: 1000 mL via INTRAVENOUS

## 2015-02-03 MED ORDER — PROPOFOL 10 MG/ML IV BOLUS
INTRAVENOUS | Status: AC
  Filled 2015-02-03: qty 20

## 2015-02-03 MED ORDER — PROPOFOL 10 MG/ML IV BOLUS
INTRAVENOUS | Status: DC | PRN
  Administered 2015-02-03 (×3): 40 mg via INTRAVENOUS
  Administered 2015-02-03: 20 mg via INTRAVENOUS
  Administered 2015-02-03 (×3): 40 mg via INTRAVENOUS
  Administered 2015-02-03: 20 mg via INTRAVENOUS
  Administered 2015-02-03 (×2): 40 mg via INTRAVENOUS

## 2015-02-03 MED ORDER — SODIUM CHLORIDE 0.9 % IV SOLN: INTRAVENOUS | Status: DC

## 2015-02-03 MED ORDER — PROPOFOL 10 MG/ML IV BOLUS
INTRAVENOUS | Status: AC
  Filled 2015-02-03: qty 40

## 2015-02-03 MED ORDER — LACTATED RINGERS IV SOLN
INTRAVENOUS | Status: DC | PRN
  Administered 2015-02-03: 13:00:00 via INTRAVENOUS

## 2015-02-03 SURGICAL SUPPLY — 22 items

## 2015-02-03 NOTE — Op Note (Signed)
Procedure: Surveillance colonoscopy. 06/01/2010 colonoscopy performed with removal of multiple adenomatous and hyperplastic colon polyps  Endoscopist: Earle Gell  Premedication: Propofol administered by anesthesia  Procedure: The patient was placed in the left lateral decubitus position. Anal inspection and digital rectal exam were normal. Pentax pediatric colonoscope was introduced into the rectum and advanced to the cecum. A normal-appearing appendiceal orifice and ileocecal valve were identified. Colonic preparation for the exam today was good. Withdrawal time was 25 minutes  Rectum. From the distal rectum, two 5 mm hyperplastic appearing sessile polyps were removed with the cold snare. Otherwise the rectum and retroflexed view of the distal rectum were normal.  Sigmoid colon and descending colon. Normal  Splenic flexure. Normal  Transverse colon. A 7 mm sessile polyp was removed from the distal transverse colon with the electrocautery snare and a 7 mm sessile polyp was removed from the mid transverse colon with the electrocautery snare  Hepatic flexure. Normal  Ascending colon. Two 3 mm sessile polyps were removed from the mid ascending colon with the cold biopsy forceps  Cecum and ileocecal valve. Normal  Assessment: Two small polyps were removed from the rectum, two small polyps were removed from the transverse colon, and two  diminutive polyps were removed from the ascending colon. Otherwise the surveillance colonoscopy was normal  Recommendation: I will review the colon polyp pathology to determine when the patient should undergo a repeat colonoscopy

## 2015-02-03 NOTE — Discharge Instructions (Signed)
Colon Polyps Polyps are lumps of extra tissue growing inside the body. Polyps can grow in the large intestine (colon). Most colon polyps are noncancerous (benign). However, some colon polyps can become cancerous over time. Polyps that are larger than a pea may be harmful. To be safe, caregivers remove and test all polyps. CAUSES  Polyps form when mutations in the genes cause your cells to grow and divide even though no more tissue is needed. RISK FACTORS There are a number of risk factors that can increase your chances of getting colon polyps. They include:  Being older than 50 years.  Family history of colon polyps or colon cancer.  Long-term colon diseases, such as colitis or Crohn disease.  Being overweight.  Smoking.  Being inactive.  Drinking too much alcohol. SYMPTOMS  Most small polyps do not cause symptoms. If symptoms are present, they may include:  Blood in the stool. The stool may look dark red or black.  Constipation or diarrhea that lasts longer than 1 week. DIAGNOSIS People often do not know they have polyps until their caregiver finds them during a regular checkup. Your caregiver can use 4 tests to check for polyps:  Digital rectal exam. The caregiver wears gloves and feels inside the rectum. This test would find polyps only in the rectum.  Barium enema. The caregiver puts a liquid called barium into your rectum before taking X-rays of your colon. Barium makes your colon look white. Polyps are dark, so they are easy to see in the X-ray pictures.  Sigmoidoscopy. A thin, flexible tube (sigmoidoscope) is placed into your rectum. The sigmoidoscope has a light and tiny camera in it. The caregiver uses the sigmoidoscope to look at the last third of your colon.  Colonoscopy. This test is like sigmoidoscopy, but the caregiver looks at the entire colon. This is the most common method for finding and removing polyps. TREATMENT  Any polyps will be removed during a  sigmoidoscopy or colonoscopy. The polyps are then tested for cancer. PREVENTION  To help lower your risk of getting more colon polyps:  Eat plenty of fruits and vegetables. Avoid eating fatty foods.  Do not smoke.  Avoid drinking alcohol.  Exercise every day.  Lose weight if recommended by your caregiver.  Eat plenty of calcium and folate. Foods that are rich in calcium include milk, cheese, and broccoli. Foods that are rich in folate include chickpeas, kidney beans, and spinach. HOME CARE INSTRUCTIONS Keep all follow-up appointments as directed by your caregiver. You may need periodic exams to check for polyps. SEEK MEDICAL CARE IF: You notice bleeding during a bowel movement.   This information is not intended to replace advice given to you by your health care provider. Make sure you discuss any questions you have with your health care provider.   Document Released: 12/24/2003 Document Revised: 04/19/2014 Document Reviewed: 06/08/2011 Elsevier Interactive Patient Education 2016 Elsevier Inc. Colonoscopy, Care After Refer to this sheet in the next few weeks. These instructions provide you with information on caring for yourself after your procedure. Your health care provider may also give you more specific instructions. Your treatment has been planned according to current medical practices, but problems sometimes occur. Call your health care provider if you have any problems or questions after your procedure. WHAT TO EXPECT AFTER THE PROCEDURE  After your procedure, it is typical to have the following:  A small amount of blood in your stool.  Moderate amounts of gas and mild abdominal cramping or bloating.  HOME CARE INSTRUCTIONS  Do not drive, operate machinery, or sign important documents for 24 hours.  You may shower and resume your regular physical activities, but move at a slower pace for the first 24 hours.  Take frequent rest periods for the first 24 hours.  Walk  around or put a warm pack on your abdomen to help reduce abdominal cramping and bloating.  Drink enough fluids to keep your urine clear or pale yellow.  You may resume your normal diet as instructed by your health care provider. Avoid heavy or fried foods that are hard to digest.  Avoid drinking alcohol for 24 hours or as instructed by your health care provider.  Only take over-the-counter or prescription medicines as directed by your health care provider.  If a tissue sample (biopsy) was taken during your procedure:  Do not take aspirin or blood thinners for 7 days, or as instructed by your health care provider.  Do not drink alcohol for 7 days, or as instructed by your health care provider.  Eat soft foods for the first 24 hours. SEEK MEDICAL CARE IF: You have persistent spotting of blood in your stool 2-3 days after the procedure. SEEK IMMEDIATE MEDICAL CARE IF:  You have more than a small spotting of blood in your stool.  You pass large blood clots in your stool.  Your abdomen is swollen (distended).  You have nausea or vomiting.  You have a fever.  You have increasing abdominal pain that is not relieved with medicine.   This information is not intended to replace advice given to you by your health care provider. Make sure you discuss any questions you have with your health care provider.   Document Released: 11/11/2003 Document Revised: 01/17/2013 Document Reviewed: 12/04/2012 Elsevier Interactive Patient Education [employer].

## 2015-02-03 NOTE — Anesthesia Preprocedure Evaluation (Addendum)
Anesthesia Evaluation  Patient identified by MRN, date of birth, ID band Patient awake    Reviewed: Allergy & Precautions, NPO status , Patient's Chart, lab work & pertinent test results  Airway Mallampati: II  TM Distance: >3 FB Neck ROM: Full    Dental no notable dental hx.    Pulmonary Current Smoker,    Pulmonary exam normal breath sounds clear to auscultation       Cardiovascular hypertension, Pt. on medications + Past MI and + Peripheral Vascular Disease  Normal cardiovascular exam Rhythm:Regular Rate:Normal     Neuro/Psych Seizures -,  PSYCHIATRIC DISORDERS CVA    GI/Hepatic negative GI ROS, Neg liver ROS,   Endo/Other  negative endocrine ROS  Renal/GU negative Renal ROS  negative genitourinary   Musculoskeletal negative musculoskeletal ROS (+)   Abdominal   Peds negative pediatric ROS (+)  Hematology negative hematology ROS (+)   Anesthesia Other Findings   Reproductive/Obstetrics negative OB ROS                            Anesthesia Physical Anesthesia Plan  ASA: III  Anesthesia Plan: MAC   Post-op Pain Management:    Induction: Intravenous  Airway Management Planned: Natural Airway  Additional Equipment:   Intra-op Plan:   Post-operative Plan:   Informed Consent: I have reviewed the patients History and Physical, chart, labs and discussed the procedure including the risks, benefits and alternatives for the proposed anesthesia with the patient or authorized representative who has indicated his/her understanding and acceptance.   Dental advisory given  Plan Discussed with: CRNA  Anesthesia Plan Comments:        Anesthesia Quick Evaluation

## 2015-02-03 NOTE — H&P (Signed)
  Procedure: Surveillance colonoscopy. 06/01/2010 colonoscopy performed with removal of multiple adenomatous and hyperplastic colon polyps  History: The patient is a 59 year old male born February 23, 1956. He is scheduled to undergo a surveillance colonoscopy today.  Past medical history: Carotid artery plaques. Hemorrhagic stroke in 1996. Seizure disorder following stroke. Glaucoma. February 2011 right carotid artery occlusion with stroke. Hila 9 cholecystectomy. Right inguinal herniorrhaphy. Right knee surgery. Right carotid endarterectomy.  Exam: The patient is alert and lying comfortably on the endoscopy stretcher. Abdomen is soft and nontender to palpation. Lungs are clear to auscultation. Cardiac exam reveals a regular rhythm.  Plan: Proceed with surveillance colonoscopy

## 2015-02-03 NOTE — Anesthesia Postprocedure Evaluation (Signed)
  Anesthesia Post-op Note  Patient: [name]  Procedure(s) Performed: Procedure(s) (LRB): COLONOSCOPY WITH PROPOFOL (N/A)  Patient Location: PACU  Anesthesia Type: MAC  Level of Consciousness: awake and alert   Airway and Oxygen Therapy: Patient Spontanous Breathing  Post-op Pain: mild  Post-op Assessment: Post-op Vital signs reviewed, Patient's Cardiovascular Status Stable, Respiratory Function Stable, Patent Airway and No signs of Nausea or vomiting  Last Vitals:  Filed Vitals:   02/03/15 1410  BP: 134/66  Pulse: 57  Temp:   Resp: 13    Post-op Vital Signs: stable   Complications: No apparent anesthesia complications

## 2015-02-03 NOTE — Transfer of Care (Signed)
Immediate Anesthesia Transfer of Care Note  Patient: [name]  Procedure(s) Performed: Procedure(s): COLONOSCOPY WITH PROPOFOL (N/A)  Patient Location: PACU and Endoscopy Unit  Anesthesia Type:MAC  Level of Consciousness: awake, oriented, pateint uncooperative, lethargic and responds to stimulation  Airway & Oxygen Therapy: Patient Spontanous Breathing and Patient connected to face mask oxygen  Post-op Assessment: Report given to RN, Post -op Vital signs reviewed and stable and Patient moving all extremities  Post vital signs: Reviewed and stable  Last Vitals:  Filed Vitals:   02/03/15 1217  BP: 132/63  Pulse: 65  Temp: 36.7 C  Resp: 12    Complications: No apparent anesthesia complications

## 2015-02-04 ENCOUNTER — Encounter (HOSPITAL_COMMUNITY): Payer: Self-pay | Admitting: Gastroenterology

## 2015-07-15 ENCOUNTER — Ambulatory Visit
Admission: RE | Admit: 2015-07-15 | Discharge: 2015-07-15 | Disposition: A | Discharge status: Home / Self Care | Payer: 59 | Source: Ambulatory Visit | Attending: Internal Medicine | Admitting: Internal Medicine

## 2015-07-15 ENCOUNTER — Other Ambulatory Visit: Payer: Self-pay | Admitting: Internal Medicine

## 2015-07-15 DIAGNOSIS — R61 Generalized hyperhidrosis: Secondary | ICD-10-CM

## 2015-10-31 ENCOUNTER — Encounter: Payer: Self-pay | Admitting: Podiatry

## 2015-10-31 ENCOUNTER — Ambulatory Visit (INDEPENDENT_AMBULATORY_CARE_PROVIDER_SITE_OTHER): Payer: 59

## 2015-10-31 ENCOUNTER — Ambulatory Visit (INDEPENDENT_AMBULATORY_CARE_PROVIDER_SITE_OTHER): Payer: 59 | Admitting: Podiatry

## 2015-10-31 VITALS — BP 144/82 | HR 70 | Resp 12

## 2015-10-31 DIAGNOSIS — M722 Plantar fascial fibromatosis: Secondary | ICD-10-CM | POA: Diagnosis not present

## 2015-10-31 DIAGNOSIS — M79671 Pain in right foot: Secondary | ICD-10-CM

## 2015-10-31 MED ORDER — DICLOFENAC SODIUM 75 MG PO TBEC: 75.0000 mg | DELAYED_RELEASE_TABLET | Freq: Two times a day (BID) | ORAL | Status: DC

## 2015-10-31 MED ORDER — TRIAMCINOLONE ACETONIDE 10 MG/ML IJ SUSP
10.0000 mg | Freq: Once | INTRAMUSCULAR | Status: AC
Start: 2015-10-31 — End: 2015-10-31
  Administered 2015-10-31: 10 mg

## 2015-10-31 NOTE — Progress Notes (Signed)
   Subjective:    Patient ID: [name], male    DOB: [birth year], 60 y.o.   MRN: [id number]  HPI  Chief Complaint  Patient presents with  . Plantar Fasciitis    ''RT FOOT BOTTOM OF THE HEEL IS BEEN HURTING 3 MONTHS. HEEL IS WORSE WHEN PUTTING PRESSURE FIRST STEP IN THE MORNING. TRIED STRETCHING, OTC INSERTS-NO RELIEF.     Review of Systems  Musculoskeletal: Positive for gait problem.       Objective:   Physical Exam        Assessment & Plan:

## 2015-10-31 NOTE — Patient Instructions (Signed)
Plantar Fasciitis Plantar fasciitis is a painful foot condition that affects the heel. It occurs when the band of tissue that connects the toes to the heel bone (plantar fascia) becomes irritated. This can happen after exercising too much or doing other repetitive activities (overuse injury). The pain from plantar fasciitis can range from mild irritation to severe pain that makes it difficult for you to walk or move. The pain is usually worse in the morning or after you have been sitting or lying down for a while. CAUSES This condition may be caused by:  Standing for long periods of time.  Wearing shoes that do not fit.  Doing high-impact activities, including running, aerobics, and ballet.  Being overweight.  Having an abnormal way of walking (gait).  Having tight calf muscles.  Having high arches in your feet.  Starting a new athletic activity. SYMPTOMS The main symptom of this condition is heel pain. Other symptoms include:  Pain that gets worse after activity or exercise.  Pain that is worse in the morning or after resting.  Pain that goes away after you walk for a few minutes. DIAGNOSIS This condition may be diagnosed based on your signs and symptoms. Your health care provider will also do a physical exam to check for:  A tender area on the bottom of your foot.  A high arch in your foot.  Pain when you move your foot.  Difficulty moving your foot. You may also need to have imaging studies to confirm the diagnosis. These can include:  X-rays.  Ultrasound.  MRI. TREATMENT  Treatment for plantar fasciitis depends on the severity of the condition. Your treatment may include:  Rest, ice, and over-the-counter pain medicines to manage your pain.  Exercises to stretch your calves and your plantar fascia.  A splint that holds your foot in a stretched, upward position while you sleep (night splint).  Physical therapy to relieve symptoms and prevent problems in the  future.  Cortisone injections to relieve severe pain.  Extracorporeal shock wave therapy (ESWT) to stimulate damaged plantar fascia with electrical impulses. It is often used as a last resort before surgery.  Surgery, if other treatments have not worked after 12 months. HOME CARE INSTRUCTIONS  Take medicines only as directed by your health care provider.  Avoid activities that cause pain.  Roll the bottom of your foot over a bag of ice or a bottle of cold water. Do this for 20 minutes, 3-4 times a day.  Perform simple stretches as directed by your health care provider.  Try wearing athletic shoes with air-sole or gel-sole cushions or soft shoe inserts.  Wear a night splint while sleeping, if directed by your health care provider.  Keep all follow-up appointments with your health care provider. PREVENTION   Do not perform exercises or activities that cause heel pain.  Consider finding low-impact activities if you continue to have problems.  Lose weight if you need to. The best way to prevent plantar fasciitis is to avoid the activities that aggravate your plantar fascia. SEEK MEDICAL CARE IF:  Your symptoms do not go away after treatment with home care measures.  Your pain gets worse.  Your pain affects your ability to move or do your daily activities.   This information is not intended to replace advice given to you by your health care provider. Make sure you discuss any questions you have with your health care provider.   Document Released: 12/22/2000 Document Revised: 12/18/2014 Document Reviewed: 02/06/2014 Elsevier   Interactive Patient Education 2016 Elsevier Inc.  

## 2015-11-02 NOTE — H&P (Signed)
Patient presents stating that he is having a lot of pain in the plantar aspect of his right heel and making it difficult to wear shoe gear comfortably. States she's tried more supportive shoes ice therapy and stretching  Neurovascular status intact muscle strength adequate range of motion within normal limits with patient noted to have good digital perfusion and well oriented 3. He has good skin texture he has no equinus condition and was found to have severe discomfort plantar aspect right heel at the insertional point tendon into the calcaneus with inflammation and fluid buildup noted. Patient has exquisite discomfort when palpated  Acute plantar fasciitis of the right heel  H&P x-rays reviewed and today I went ahead and I injected the right plantar fascia 3 mg Kenalog 5 mg Xylocaine and applied fascial brace with instructions on usage. Instructed on physical therapy and supportive shoes and not going barefoot and reappoint to recheck

## 2015-11-14 ENCOUNTER — Encounter: Payer: Self-pay | Admitting: Podiatry

## 2015-11-14 ENCOUNTER — Ambulatory Visit (INDEPENDENT_AMBULATORY_CARE_PROVIDER_SITE_OTHER): Payer: 59 | Admitting: Podiatry

## 2015-11-14 VITALS — BP 128/68 | HR 88 | Resp 12

## 2015-11-14 DIAGNOSIS — M722 Plantar fascial fibromatosis: Secondary | ICD-10-CM | POA: Diagnosis not present

## 2015-11-14 MED ORDER — TRIAMCINOLONE ACETONIDE 10 MG/ML IJ SUSP
10.0000 mg | Freq: Once | INTRAMUSCULAR | Status: AC
  Administered 2015-11-14: 10 mg

## 2015-11-14 NOTE — Progress Notes (Signed)
Subjective:     Patient ID: [name], male   DOB: 10-29-1955, 60 y.o.   MRN: [id number]  HPI patient states my heel is improved right but there still is an area of tenderness when I walk   Review of Systems     Objective:   Physical Exam Neurovascular status intact muscle strength adequate with patient found to have quite a bit of discomfort in the plantar heel region right with inflammation and fluid buildup noted    Assessment:     Plantar fasciitis right with inflammation and fluid    Plan:     Reinjected the plantar fascia right 3 mg Kenalog 5 mg Xylocaine and instructed on physical therapy

## 2015-11-20 ENCOUNTER — Ambulatory Visit (INDEPENDENT_AMBULATORY_CARE_PROVIDER_SITE_OTHER): Payer: 59 | Admitting: Nurse Practitioner

## 2015-11-20 ENCOUNTER — Encounter: Payer: Self-pay | Admitting: Nurse Practitioner

## 2015-11-20 VITALS — BP 128/67 | HR 68 | Ht 72.0 in | Wt 191.6 lb

## 2015-11-20 DIAGNOSIS — E7800 Pure hypercholesterolemia, unspecified: Secondary | ICD-10-CM | POA: Diagnosis not present

## 2015-11-20 DIAGNOSIS — G40409 Other generalized epilepsy and epileptic syndromes, not intractable, without status epilepticus: Secondary | ICD-10-CM | POA: Diagnosis not present

## 2015-11-20 DIAGNOSIS — Z5181 Encounter for therapeutic drug level monitoring: Secondary | ICD-10-CM

## 2015-11-20 DIAGNOSIS — I6529 Occlusion and stenosis of unspecified carotid artery: Secondary | ICD-10-CM

## 2015-11-20 DIAGNOSIS — I10 Essential (primary) hypertension: Secondary | ICD-10-CM | POA: Diagnosis not present

## 2015-11-20 DIAGNOSIS — G40909 Epilepsy, unspecified, not intractable, without status epilepticus: Secondary | ICD-10-CM

## 2015-11-20 MED ORDER — TEGRETOL-XR 400 MG PO TB12: 400.0000 mg | ORAL_TABLET | Freq: Four times a day (QID) | ORAL | 3 refills | Status: DC

## 2015-11-20 MED ORDER — ASPIRIN-DIPYRIDAMOLE ER 25-200 MG PO CP12: 1.0000 | ORAL_CAPSULE | Freq: Two times a day (BID) | ORAL | 3 refills | Status: DC

## 2015-11-20 NOTE — Patient Instructions (Signed)
Continue Agggrenox at current dose will refill Keep LDL below [id number] Keep Systolic blood pressure less than 130, today's reading 128/67 Continue Tegretol at current dose will refill will check level Call for any seizure activity Carotid Dopplers followed by CVTS Try to Stop smoking as this is an increased risk factor for stroke Send recent labs to my attention Follow-up in one year

## 2015-11-20 NOTE — Progress Notes (Addendum)
[facility]  PATIENT: [name] DOB: 08/16/1955   REASON FOR VISIT: Follow-up for seizure disorder, history of stroke , risk factors of smoking hypertension hyperlipidemia HISTORY FROM: Patient    HISTORY OF PRESENT ILLNESS:[name], 60 year old male returns for yearly followup.  .He has a history of posterior circulatory TIA in September of 2011 with vascular risk factors of smoking, hypertension, hyperlipidemia, and remote brain intracranial hemorrhage in 1996. He also has had a seizure disorder since 1978 following encephalitis with breakthrough seizures occurring following a brain hemorrhage in 1996 currently well controlled on Tegretol. In addition he has history of right subcortical infarct in February of 2011 right carotid endarterectomy in March of 2011 He was initially started on Plavix but had diarrhea on the drug. He was on Aggrenox  however stopped the drug due to the expense of greater than $200 a month. He was then  on aspirin now  back on Aggrenox as he now has insurance He denies further stroke or TIA symptoms since last seen. Carotid duplex with Dr. Kellie Simmering 09/18/2013 with progression left ICA  stenosis.  Dopplers followed by CVTS. He works full-time as an Clinical biochemist He returns for reevaluation .    REVIEW OF SYSTEMS: Full 14 system review of systems performed and notable only for those listed, all others are neg:  Constitutional: neg  Cardiovascular: neg Ear/Nose/Throat: neg  Skin: neg Eyes: neg Respiratory: neg Gastroitestinal: neg  Hematology/Lymphatic: neg  Endocrine: neg Musculoskeletal:neg Allergy/Immunology: neg Neurological: History of seizure disorder and stroke Psychiatric: neg Sleep : neg   ALLERGIES: No Known Allergies  HOME MEDICATIONS: Outpatient Medications Prior to Visit  Medication Sig Dispense Refill  . budesonide-formoterol (SYMBICORT) 80-4.5 MCG/ACT inhaler Inhale 2 puffs into the lungs 2 (two) times daily as  needed (for wheezing).     . Cholecalciferol (VITAMIN D) 2000 UNITS tablet Take 2,000 Units by mouth daily.      . diclofenac (VOLTAREN) 75 MG EC tablet Take 1 tablet (75 mg total) by mouth 2 (two) times daily. 50 tablet 2  . rosuvastatin (CRESTOR) 10 MG tablet Take 10 mg by mouth daily.      . sertraline (ZOLOFT) 50 MG tablet Take 50 mg by mouth every morning.     . tadalafil (CIALIS) 10 MG tablet Take 10 mg by mouth daily as needed for erectile dysfunction.     . TEGRETOL-XR 400 MG 12 hr tablet Take 1 tablet (400 mg total) by mouth 4 (four) times daily. 360 tablet 3  . valsartan-hydrochlorothiazide (DIOVAN-HCT) 160-25 MG per tablet Take 0.5 tablets by mouth at bedtime.      No facility-administered medications prior to visit.     PAST MEDICAL HISTORY: Past Medical History:  Diagnosis Date  . Artery stenosis (HCC)    L carotid  . CVA (cerebral infarction) 05/27/09   TIA, posterior circulation 7/11  . Glaucoma   . Hemorrhage 1996   intracerebral  . HLD (hyperlipidemia)   . HTN (hypertension)   . ICAO (internal carotid artery occlusion)    right ICA stenosis, s/p R carotid endarterectomy on 06/13/09  . Meningitis    viral; as a child   . Monoclonal gammopathy of unknown significance 02/07/2013   No anemia,no elevation of Iggs,  no m-spike, ?monoclonal protein on IFE 9/14  . Myocardial infarction [facility]) March 2011   After surgery    . Seizure disorder ([place])    at time of [place] in 1996.   . Seizures ([place])  5 years ago  . Stroke [facility]) March 2011    PAST SURGICAL HISTORY: Past Surgical History:  Procedure Laterality Date  . CAROTID ENDARTERECTOMY Right 06/12/09   cea  . COLONOSCOPY  06/16/01   by Dr. Earle Gell. positive for colonic polyp, non-adenomatous   . COLONOSCOPY WITH PROPOFOL N/A 02/03/2015   Procedure: COLONOSCOPY WITH PROPOFOL;  Surgeon: Garlan Fair, MD;  Location: [facility] ENDOSCOPY;  Service: Endoscopy;  Laterality: N/A;  . GLAUCOMA REPAIR  2016   by taking out  cataracts (bilateral)  . R CEA  3/11  . R inguinal hernia repair  07/05/09    FAMILY HISTORY: Family History  Problem Relation Age of Onset  . Heart disease Father     Heart Disease before age 68  . Heart attack Father   . Heart disease Mother   . Hyperlipidemia Brother   . Hyperlipidemia Sister     SOCIAL HISTORY: Social History   Social History  . Marital status: Married    Spouse name: N/A  . Number of children: 2  . Years of education: 12+   Occupational History  . [employer]   Social History Main Topics  . Smoking status: Current Every Day Smoker    Packs/day: 1.50    Types: Cigarettes  . Smokeless tobacco: Current User    Types: Chew     Comment: smoked 1 ppd for 30-40 years. (tobacco dependance)  . 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 Drug use: No  . Sexual activity: Not on file   Other Topics Concern  . Not on file   Social History Narrative   Married, lives in [place] with his wife.   2 children (daughter, Janett Billow, is a Marine scientist on [employer] at [employer])    Patient is right handed   Patient has a high school education with some college.   Patient drinks at least 5 cups daily.              PHYSICAL EXAM  Vitals:   11/20/15 1521  BP: 128/67  Pulse: 68  Weight: 191 lb 9.6 oz (86.9 kg)  Height: 6' (1.829 m)   Body mass index is 25.99 kg/m. General: well developed, well nourished, seated, in no evident distress  Head: head normocephalic and atraumatic. Oropharynx benign  Neck: supple with bil soft carotid bruits. Right CEA scar.  Cardiovascular: regular rate and rhythm, no murmurs  Neurologic Exam  Mental Status: Awake and fully alert. Oriented to place and time. Follows all commands. Speech and language normal.  Cranial Nerves: Pupils equal, briskly reactive to light. Extraocular movements full without nystagmus. Visual fields full to confrontation. Hearing  intact and symmetric to finger snap. Facial sensation intact. Face, tongue, palate move normally and symmetrically. Neck flexion and extension normal.  Motor: Normal bulk and tone. Normal strength in all tested extremity muscles.No focal weakness  Sensory.: intact to touch and pinprick and vibratory.  Coordination: Rapid alternating movements normal in all extremities. Finger-to-nose and heel-to-shin performed accurately bilaterally.  Gait and Station: Arises from chair without difficulty. Stance is normal. Gait demonstrates normal stride length and balance . Able to heel, toe and tandem walk without difficulty.  Reflexes: 2+ and symmetric. Toes downgoing.  DIAGNOSTIC DATA (LABS, IMAGING, TESTING)  ASSESSMENT AND PLAN  60 y.o. year old male  has a past medical history of Hemorrhage (1996); CVA (cerebral infarction) (05/27/09); HTN (hypertension);  HLD (hyperlipidemia); ICAO (internal carotid artery occlusion); Artery stenosis; Seizure disorder; Meningitis; Stroke (March 2011); Myocardial infarction (March 2011); and Monoclonal gammopathy of unknown significance (02/07/2013). here to follow-up. He has not had stroke or TIA symptoms. He has not had any seizure activity since last seen.The patient is a current patient of Dr. Leonie Man  who is out of the office today . This note is sent to the work in doctor.     PLAN: Continue Agggrenox at current dose will refill Keep LDL below 100 followed by Dr. Inda Merlin Keep Systolic blood pressure less than 130, today's reading 128/67 Continue Tegretol at current dose will refill will check level Call for any seizure activity Carotid Dopplers followed by CVTS Try to Stop smoking as this is an increased risk factor for stroke Send any  labs to my attention Follow-up in one year Dennie Bible, [facility], [facility], APRN  [facility] [address] [place], [place] [address] [phone]  Personally  participated in and made any  corrections needed to history, physical, neuro exam,assessment and plan as stated above.   Sarina Ill, MD [facility]

## 2015-11-21 LAB — CARBAMAZEPINE LEVEL, TOTAL: CARBAMAZEPINE LVL: 10 ug/mL (ref 4.0–12.0)

## 2015-11-24 ENCOUNTER — Telehealth: Payer: Self-pay | Admitting: *Deleted

## 2015-11-24 NOTE — Telephone Encounter (Signed)
Good level of CBZ(carbamazepine or tegretol) continue same dose.    LMVM for pt to return call.

## 2015-11-24 NOTE — Telephone Encounter (Signed)
-----   Message from Dennie Bible, NP sent at 11/24/2015  8:00 AM EDT ----- Good level of CBZ continue same dose

## 2015-11-25 NOTE — Telephone Encounter (Signed)
Spoke to pt and relayed that his carbamazepine level good.  Continue same dose of medication.  He verbalized understanding.

## 2016-01-06 ENCOUNTER — Ambulatory Visit
Admission: RE | Admit: 2016-01-06 | Discharge: 2016-01-06 | Disposition: A | Discharge status: Home / Self Care | Payer: 59 | Source: Ambulatory Visit | Attending: Internal Medicine | Admitting: Internal Medicine

## 2016-01-06 ENCOUNTER — Other Ambulatory Visit: Payer: Self-pay | Admitting: Internal Medicine

## 2016-01-06 DIAGNOSIS — M25551 Pain in right hip: Secondary | ICD-10-CM

## 2016-01-06 DIAGNOSIS — M25552 Pain in left hip: Secondary | ICD-10-CM

## 2016-05-09 DIAGNOSIS — Z23 Encounter for immunization: Secondary | ICD-10-CM | POA: Diagnosis not present

## 2016-05-24 ENCOUNTER — Encounter: Payer: Self-pay | Admitting: Podiatry

## 2016-05-24 ENCOUNTER — Ambulatory Visit (INDEPENDENT_AMBULATORY_CARE_PROVIDER_SITE_OTHER): Payer: 59 | Admitting: Podiatry

## 2016-05-24 VITALS — BP 156/79 | HR 75 | Resp 16

## 2016-05-24 DIAGNOSIS — M722 Plantar fascial fibromatosis: Secondary | ICD-10-CM | POA: Diagnosis not present

## 2016-05-24 MED ORDER — TRIAMCINOLONE ACETONIDE 10 MG/ML IJ SUSP
10.0000 mg | Freq: Once | INTRAMUSCULAR | Status: AC
  Administered 2016-05-24: 10 mg

## 2016-05-24 NOTE — Progress Notes (Signed)
Subjective:     Patient ID: [name], male   DOB: 09-25-1955, 61 y.o.   MRN: [id number]  HPI patient presents stating that he has developed heel pain again right and did real well for for 5 months   Review of Systems     Objective:   Physical Exam Neurovascular status intact with severe discomfort right plantar fascia at the insertional point tendon into the calcaneus    Assessment:     Acute plantar fasciitis right    Plan:     Reinjected the plantar fascia 3 mg Kenalog 5 mg Xylocaine and instructed on physical therapy and reappoint to recheck

## 2016-06-30 ENCOUNTER — Ambulatory Visit (INDEPENDENT_AMBULATORY_CARE_PROVIDER_SITE_OTHER): Payer: 59

## 2016-06-30 ENCOUNTER — Ambulatory Visit (INDEPENDENT_AMBULATORY_CARE_PROVIDER_SITE_OTHER): Payer: 59 | Admitting: Orthopaedic Surgery

## 2016-06-30 ENCOUNTER — Encounter (INDEPENDENT_AMBULATORY_CARE_PROVIDER_SITE_OTHER): Payer: Self-pay | Admitting: Orthopaedic Surgery

## 2016-06-30 VITALS — BP 165/89 | HR 72 | Ht 74.0 in | Wt 180.0 lb

## 2016-06-30 DIAGNOSIS — M5442 Lumbago with sciatica, left side: Secondary | ICD-10-CM

## 2016-06-30 DIAGNOSIS — M7062 Trochanteric bursitis, left hip: Secondary | ICD-10-CM | POA: Diagnosis not present

## 2016-06-30 DIAGNOSIS — M25561 Pain in right knee: Secondary | ICD-10-CM

## 2016-06-30 DIAGNOSIS — M25552 Pain in left hip: Secondary | ICD-10-CM

## 2016-06-30 DIAGNOSIS — G8929 Other chronic pain: Secondary | ICD-10-CM

## 2016-06-30 MED ORDER — METHYLPREDNISOLONE ACETATE 40 MG/ML IJ SUSP
40.0000 mg | INTRAMUSCULAR | Status: AC | PRN
  Administered 2016-06-30: 40 mg via INTRA_ARTICULAR

## 2016-06-30 MED ORDER — BUPIVACAINE HCL 0.25 % IJ SOLN
2.0000 mL | INTRAMUSCULAR | Status: AC | PRN
  Administered 2016-06-30: 2 mL via INTRA_ARTICULAR

## 2016-06-30 MED ORDER — LIDOCAINE HCL 1 % IJ SOLN
0.5000 mL | INTRAMUSCULAR | Status: AC | PRN
  Administered 2016-06-30: .5 mL

## 2016-06-30 NOTE — Progress Notes (Signed)
Office Visit Note   Patient: [name]           Date of Birth: 1956-01-16           MRN: [id number] Visit Date: 06/30/2016              Requested by: [name], MD [address] [place], [place] [address] PCP: Henrine Screws, MD   Assessment & Plan: Visit Diagnoses:  1. Chronic left-sided low back pain with left-sided sciatica   2. Pain in left hip   3. Acute pain of right knee   4. Trochanteric bursitis, left hip     Plan: Left trochanteric injection performed with good relief. He tolerated the injection. Principal problem is likely lumbar spondylosis with the L2-3 spurring. For this a 7 on the symptoms and if not we'll need to consider lumbar MRI scan.   Follow-Up Instructions: No Follow-up on file.   Orders:  Orders Placed This Encounter  Procedures  . XR Lumbar Spine 2-3 Views  . XR HIP UNILAT W OR W/O PELVIS 2-3 VIEWS LEFT  . XR KNEE 3 VIEW RIGHT   No orders of the defined types were placed in this encounter.     Procedures: Large Joint Inj Date/Time: 06/30/2016 11:24 AM Performed by: Marybelle Killings Authorized by: Rodell Perna C   Location:  Hip Site:  L greater trochanter Needle Size:  22 G Ultrasound Guidance: No   Fluoroscopic Guidance: No   Arthrogram: No   Medications:  0.5 mL lidocaine 1 %; 2 mL bupivacaine 0.25 %; 40 mg methylPREDNISolone acetate 40 MG/ML Aspiration Attempted: No       Clinical Data: No additional findings.   Subjective: Chief Complaint  Patient presents with  . Left Hip - Pain  . Right Knee - Pain  . Lower Back - Pain    Patient presents today with pain in his left buttocks that radiates down his left leg to his foot. He states that his leg does feel numb and like it goes to sleep. He has had this pain for about 6 months and that sometimes the left lateral hip is painful to touch. He does have a history of back problems. About 1 1/2 weeks ago he had to walk a lot and is having acute right knee  pain. He states that his right knee is killing him. He is taking about 900mg  of advil at a time with relief. 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 He states at times pain has been so severe it brings tears to his eyes is not able to continue working. Back pain left buttocks pain left lateral trochanter pain that sometimes radiates all the way to his foot.  Review of Systems  Constitutional: Negative for chills and diaphoresis.  HENT: Negative for ear discharge, ear pain and nosebleeds.   Eyes: Negative for discharge and visual disturbance.  Respiratory: Negative for cough, choking and shortness of breath.   Cardiovascular: Negative for chest pain and palpitations.  Gastrointestinal: Negative for abdominal distention and abdominal pain.  Endocrine: Negative for cold intolerance and heat intolerance.  Genitourinary: Negative for flank pain and hematuria.  Musculoskeletal:       Negative for rheumatologic condition. Back and left trochanter pain for greater than 6 months. He rates the pain at 8 or 9 out of 10. Ibuprofen 8-900 mg daily helps a little bit. No fever chills  no previous back surgeries no history of gout.  Skin: Negative for rash and wound.  Neurological: Negative for seizures and speech difficulty.  Hematological: Negative for adenopathy. Does not bruise/bleed easily.  Psychiatric/Behavioral: Negative for agitation and suicidal ideas.     Objective: Vital Signs: BP (!) 165/89   Pulse 72   Ht 6\' 2"  (1.88 m)   Wt 180 lb (81.6 kg)   BMI 23.11 kg/m   Physical Exam  Constitutional: He is oriented to person, place, and time. He appears well-developed and well-nourished.  HENT:  Head: Normocephalic and atraumatic.  Eyes: EOM are normal. Pupils are equal, round, and reactive to light.  Neck: No tracheal deviation present. No thyromegaly present.  Cardiovascular: Normal rate.   Pulmonary/Chest: Effort normal. He  has no wheezes.  Abdominal: Soft. Bowel sounds are normal.  Musculoskeletal:  Moderate cytologic notch tenderness in the left significant tenderness of the left greater trochanter none on the right negative straight leg raising. Iliotibial band distally at the knee is normal. Right knee is tender diffusely without the fusion collateral cruciate ligament exam is normal. Distal pulses are intact he can heel and toe walk knee and ankle jerk are intact no sensory deficit.  Neurological: He is alert and oriented to person, place, and time.  Skin: Skin is warm and dry. Capillary refill takes less than 2 seconds.  Psychiatric: He has a normal mood and affect. His behavior is normal. Judgment and thought content normal.    Ortho Exam  Specialty Comments:  No specialty comments available.  Imaging: No results found.   PMFS History: Patient Active Problem List   Diagnosis Date Noted  . Trochanteric bursitis, left hip 06/30/2016  . Seizure disorder, grand mal ([place]) 11/13/2013  . Monoclonal gammopathy of unknown significance 02/07/2013  . Other symptoms involving cardiovascular system 09/21/2012  . Stenosis of carotid artery 09/12/2012  . Aftercare following surgery of the circulatory system, [place] 09/12/2012  . NICOTINE ADDICTION 05/19/2010  . CHEST PAIN 05/19/2010  . Elevated cholesterol 05/18/2010  . HTN (hypertension) 05/18/2010  . Seizure disorder ([place]) 05/18/2010  . TRANSIENT ISCHEMIC ATTACKS, HX OF 05/18/2010   Past Medical History:  Diagnosis Date  . Artery stenosis (HCC)    L carotid  . CVA (cerebral infarction) 05/27/09   TIA, posterior circulation 7/11  . Glaucoma   . Hemorrhage 1996   intracerebral  . HLD (hyperlipidemia)   . HTN (hypertension)   . ICAO (internal carotid artery occlusion)    right ICA stenosis, s/p R carotid endarterectomy on 06/13/09  . Meningitis    viral; as a child   . Monoclonal gammopathy of unknown significance 02/07/2013   No anemia,no elevation  of Iggs,  no m-spike, ?monoclonal protein on IFE 9/14  . Myocardial infarction March 2011   After surgery    . Seizure disorder ([place])    at time of [place] in 1996.   . Seizures ([place])     5 years ago  . Stroke [facility]) March 2011    Family History  Problem Relation Age of Onset  . Heart disease Father     Heart Disease before age 78  . Heart attack Father   . Heart disease Mother   . Hyperlipidemia Brother   . Hyperlipidemia Sister     Past Surgical History:  Procedure Laterality Date  . CAROTID ENDARTERECTOMY Right 06/12/09   cea  . COLONOSCOPY  06/16/01   by Dr. Earle Gell. positive for colonic polyp, non-adenomatous   .  COLONOSCOPY WITH PROPOFOL N/A 02/03/2015   Procedure: COLONOSCOPY WITH PROPOFOL;  Surgeon: Garlan Fair, MD;  [facility];  Service: Endoscopy;  Laterality: N/A;  . GLAUCOMA REPAIR  2016   by taking out cataracts (bilateral)  . R CEA  3/11  . R inguinal hernia repair  07/05/09   Social History   Occupational History  . [employer]   Social History Main Topics  . Smoking status: Current Every Day Smoker    Packs/day: 1.50    Types: Cigarettes  . Smokeless tobacco: Current User    Types: Chew     Comment: smoked 1 ppd for 30-40 years. (tobacco dependance)  . 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 Drug use: No  . Sexual activity: Not on file

## 2016-07-15 DIAGNOSIS — Z125 Encounter for screening for malignant neoplasm of prostate: Secondary | ICD-10-CM | POA: Diagnosis not present

## 2016-07-15 DIAGNOSIS — Z0000 Encounter for general adult medical examination without abnormal findings: Secondary | ICD-10-CM | POA: Diagnosis not present

## 2016-07-15 DIAGNOSIS — R569 Unspecified convulsions: Secondary | ICD-10-CM | POA: Diagnosis not present

## 2016-07-15 DIAGNOSIS — R61 Generalized hyperhidrosis: Secondary | ICD-10-CM | POA: Diagnosis not present

## 2016-07-15 DIAGNOSIS — E785 Hyperlipidemia, unspecified: Secondary | ICD-10-CM | POA: Diagnosis not present

## 2016-07-15 DIAGNOSIS — J449 Chronic obstructive pulmonary disease, unspecified: Secondary | ICD-10-CM | POA: Diagnosis not present

## 2016-07-15 DIAGNOSIS — Z79899 Other long term (current) drug therapy: Secondary | ICD-10-CM | POA: Diagnosis not present

## 2016-07-15 DIAGNOSIS — E559 Vitamin D deficiency, unspecified: Secondary | ICD-10-CM | POA: Diagnosis not present

## 2016-07-15 DIAGNOSIS — Z23 Encounter for immunization: Secondary | ICD-10-CM | POA: Diagnosis not present

## 2016-07-15 DIAGNOSIS — D472 Monoclonal gammopathy: Secondary | ICD-10-CM | POA: Diagnosis not present

## 2016-07-15 DIAGNOSIS — M5416 Radiculopathy, lumbar region: Secondary | ICD-10-CM | POA: Diagnosis not present

## 2016-07-15 DIAGNOSIS — I10 Essential (primary) hypertension: Secondary | ICD-10-CM | POA: Diagnosis not present

## 2016-07-28 ENCOUNTER — Ambulatory Visit (INDEPENDENT_AMBULATORY_CARE_PROVIDER_SITE_OTHER): Payer: 59 | Admitting: Orthopaedic Surgery

## 2016-07-28 ENCOUNTER — Encounter (INDEPENDENT_AMBULATORY_CARE_PROVIDER_SITE_OTHER): Payer: Self-pay | Admitting: Orthopaedic Surgery

## 2016-07-28 VITALS — BP 161/76 | HR 66 | Ht 74.0 in | Wt 180.0 lb

## 2016-07-28 DIAGNOSIS — M7062 Trochanteric bursitis, left hip: Secondary | ICD-10-CM

## 2016-07-28 NOTE — Progress Notes (Signed)
Office Visit Note   Patient: [name]           Date of Birth: 1955-08-15           MRN: [id number] Visit Date: 07/28/2016              Requested by: [name], MD [address] [place], [place] [address] PCP: Henrine Screws, MD   Assessment & Plan: Visit Diagnoses:  1. Trochanteric bursitis, left hip     Plan: Patient significantly improved post injection. We reviewed the previous x-rays of his back that showed some endplate changes with sclerosis at L2-3 with marginal osteophytes on AP x-ray. No spondylolisthesis. He's not had any back pain currently. He's gotten excellent relief from the injection. He will return if he has continued symptoms and is happy with the results of the treatment.  Follow-Up Instructions: Return if symptoms worsen or fail to improve.   Orders:  No orders of the defined types were placed in this encounter.  No orders of the defined types were placed in this encounter.     Procedures: No procedures performed   Clinical Data: No additional findings.   Subjective: Chief Complaint  Patient presents with  . Lower Back - Follow-up, Pain  . Left Hip - Pain, Follow-up    HPI patient returns post trochanteric injection for trochanteric bursitis on the left. He has some endplate degenerative changes in the lumbar spine was not having significant back pain. This injection is able to walk Linton Rump states after he walks more than mild he has mild discomfort. He does have tenderness with palpation or touchy sleeping better. When he rolls over on his left side it does not wake him up. Going from sitting standing he does well and only if he goes up by one flight of stairs to have slight discomfort. No numbness or tingling or bowel bladder symptoms. Activity level is significantly improved.  Review of Systems 14 point review of systems updated unchanged from 06/30/2016 other than as mentioned above. Objective: Vital Signs: BP (!)  161/76   Pulse 66   Ht 6\' 2"  (1.88 m)   Wt 180 lb (81.6 kg)   BMI 23.11 kg/m   Physical Exam  Constitutional: He is oriented to person, place, and time. He appears well-developed and well-nourished.  HENT:  Head: Normocephalic and atraumatic.  Eyes: EOM are normal. Pupils are equal, round, and reactive to light.  Patient wears glasses with the good visual acuity with glasses.  Neck: No tracheal deviation present. No thyromegaly present.  Cardiovascular: Normal rate.   Pulmonary/Chest: Effort normal. He has no wheezes.  Slight cough no rales or wheezes after cough.  Abdominal: Soft. Bowel sounds are normal.  Musculoskeletal:  Name straight leg raising 90 full hip range of motion. Negative popliteal compression test. Right left trochanters are nontender no sciatic notch tenderness. He gets easily from sitting to standing immature and is no longer limping negative Trendelenburg gait. No effusion noted of the knee no pitting edema distal pulses are 2+ venous stasis changes.  Neurological: He is alert and oriented to person, place, and time.  Skin: Skin is warm and dry. Capillary refill takes less than 2 seconds.  Psychiatric: He has a normal mood and affect. His behavior is normal. Judgment and thought content normal.    Ortho Exam  Specialty Comments:  No specialty comments available.  Imaging: No results found.   PMFS History: Patient Active Problem List   Diagnosis  Date Noted  . Trochanteric bursitis, left hip 06/30/2016  . Seizure disorder, grand mal ([place]) 11/13/2013  . Monoclonal gammopathy of unknown significance 02/07/2013  . Other symptoms involving cardiovascular system 09/21/2012  . Stenosis of carotid artery 09/12/2012  . Aftercare following surgery of the circulatory system, [place] 09/12/2012  . NICOTINE ADDICTION 05/19/2010  . CHEST PAIN 05/19/2010  . Elevated cholesterol 05/18/2010  . HTN (hypertension) 05/18/2010  . Seizure disorder ([place]) 05/18/2010  .  TRANSIENT ISCHEMIC ATTACKS, HX OF 05/18/2010   Past Medical History:  Diagnosis Date  . Artery stenosis (HCC)    L carotid  . CVA (cerebral infarction) 05/27/09   TIA, posterior circulation 7/11  . Glaucoma   . Hemorrhage 1996   intracerebral  . HLD (hyperlipidemia)   . HTN (hypertension)   . ICAO (internal carotid artery occlusion)    right ICA stenosis, s/p R carotid endarterectomy on 06/13/09  . Meningitis    viral; as a child   . Monoclonal gammopathy of unknown significance 02/07/2013   No anemia,no elevation of Iggs,  no m-spike, ?monoclonal protein on IFE 9/14  . Myocardial infarction [facility]) March 2011   After surgery    . Seizure disorder ([place])    at time of [place] in 1996.   . Seizures ([place])     5 years ago  . Stroke [facility]) March 2011    Family History  Problem Relation Age of Onset  . Heart disease Father     Heart Disease before age 82  . Heart attack Father   . Heart disease Mother   . Hyperlipidemia Brother   . Hyperlipidemia Sister     Past Surgical History:  Procedure Laterality Date  . CAROTID ENDARTERECTOMY Right 06/12/09   cea  . COLONOSCOPY  06/16/01   by Dr. Earle Gell. positive for colonic polyp, non-adenomatous   . COLONOSCOPY WITH PROPOFOL N/A 02/03/2015   Procedure: COLONOSCOPY WITH PROPOFOL;  Surgeon: Garlan Fair, MD;  [facility];  Service: Endoscopy;  Laterality: N/A;  . GLAUCOMA REPAIR  2016   by taking out cataracts (bilateral)  . R CEA  3/11  . R inguinal hernia repair  07/05/09   Social History   Occupational History  . [employer]   Social History Main Topics  . Smoking status: Current Every Day Smoker    Packs/day: 1.50    Types: Cigarettes  . Smokeless tobacco: Current User    Types: Chew     Comment: smoked 1 ppd for 30-40 years. (tobacco dependance)  . 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 Drug use: No  . Sexual activity: Not on  file

## 2016-09-13 DIAGNOSIS — R062 Wheezing: Secondary | ICD-10-CM | POA: Diagnosis not present

## 2016-09-13 DIAGNOSIS — R05 Cough: Secondary | ICD-10-CM | POA: Diagnosis not present

## 2016-11-07 ENCOUNTER — Other Ambulatory Visit: Payer: Self-pay | Admitting: Nurse Practitioner

## 2016-11-10 NOTE — Progress Notes (Signed)
[facility]  PATIENT: [name] DOB: 1955/10/18   REASON FOR VISIT: Follow-up for seizure disorder, history of stroke , risk factors of smoking hypertension hyperlipidemia HISTORY FROM: Patient    HISTORY OF PRESENT ILLNESS:UPDATE 11/11/16 CM [name], 61 year old male returns for yearly follow-up. He has a history of seizure disorder with no seizures in several years. He is currently maintained on Tegretol and he claims recent labs were done at Dr. Inda Merlin office. He also has a history of posterior circulatory TIA in September 2011, and remote brain intracranial hemorrhage in 1996. He has risk factors of smoking hypertension hyperlipidemia. 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 He is currently on Aggrenox for secondary stroke prevention without bruising or bleeding. He has not had further stroke or TIA symptoms He is on Crestor without complaints of myalgias. Blood pressure in the office today 139/81. He returns for reevaluation   UPDATE 8/10/17CMMr. [name], 61 year old male returns for yearly followup.  .He has a history of posterior circulatory TIA in September of 2011 with vascular risk factors of smoking, hypertension, hyperlipidemia, and remote brain intracranial hemorrhage in 1996. He also has had a seizure disorder since 1978 following encephalitis with breakthrough seizures occurring following a brain hemorrhage in 1996 currently well controlled on Tegretol. In addition he has history of right subcortical infarct in February of 2011 right carotid endarterectomy in March of 2011 He was initially started on Plavix but had diarrhea on the drug. He was on Aggrenox  however stopped the drug due to the expense of greater than $200 a month. He was then  on aspirin now  back on Aggrenox as he now has insurance He denies further stroke or TIA symptoms since last seen. Carotid duplex with Dr. Kellie Simmering 09/18/2013 with progression left ICA  stenosis.  Dopplers  followed by CVTS. He works full-time as an Clinical biochemist He returns for reevaluation .    REVIEW OF SYSTEMS: Full 14 system review of systems performed and notable only for those listed, all others are neg:  Constitutional: neg  Cardiovascular: neg Ear/Nose/Throat: neg  Skin: neg Eyes: neg Respiratory: neg Gastroitestinal: neg  Hematology/Lymphatic: neg  Endocrine: neg Musculoskeletal:neg Allergy/Immunology: neg Neurological: History of seizure disorder and stroke Psychiatric: neg Sleep : neg   ALLERGIES: No Known Allergies  HOME MEDICATIONS: Outpatient Medications Prior to Visit  Medication Sig Dispense Refill  . ALPRAZolam (XANAX) 0.25 MG tablet     . budesonide-formoterol (SYMBICORT) 80-4.5 MCG/ACT inhaler Inhale 2 puffs into the lungs 2 (two) times daily as needed (for wheezing).     . Cholecalciferol (VITAMIN D) 2000 UNITS tablet Take 2,000 Units by mouth daily.      . diclofenac (VOLTAREN) 75 MG EC tablet Take 1 tablet (75 mg total) by mouth 2 (two) times daily. 50 tablet 2  . dipyridamole-aspirin (AGGRENOX) 200-25 MG 12hr capsule Take 1 capsule by mouth 2 (two) times daily. 180 capsule 3  . rosuvastatin (CRESTOR) 10 MG tablet Take 10 mg by mouth daily.      . sertraline (ZOLOFT) 50 MG tablet Take 50 mg by mouth every morning.     . tadalafil (CIALIS) 10 MG tablet Take 10 mg by mouth daily as needed for erectile dysfunction.     . TEGRETOL-XR 400 MG 12 hr tablet Take 1 tablet (400 mg total) by mouth 4 (four) times daily. 360 tablet 3  . valsartan-hydrochlorothiazide (DIOVAN-HCT) 160-25 MG per tablet Take 0.5 tablets by mouth at bedtime.  No facility-administered medications prior to visit.     PAST MEDICAL HISTORY: Past Medical History:  Diagnosis Date  . Artery stenosis (HCC)    L carotid  . CVA (cerebral infarction) 05/27/09   TIA, posterior circulation 7/11  . Glaucoma   . Hemorrhage 1996   intracerebral  . HLD (hyperlipidemia)   . HTN (hypertension)     . ICAO (internal carotid artery occlusion)    right ICA stenosis, s/p R carotid endarterectomy on 06/13/09  . Meningitis    viral; as a child   . Monoclonal gammopathy of unknown significance 02/07/2013   No anemia,no elevation of Iggs,  no m-spike, ?monoclonal protein on IFE 9/14  . Myocardial infarction [facility]) March 2011   After surgery    . Seizure disorder ([place])    at time of [place] in 1996.   . Seizures ([place])     5 years ago  . Stroke [facility]) March 2011    PAST SURGICAL HISTORY: Past Surgical History:  Procedure Laterality Date  . CAROTID ENDARTERECTOMY Right 06/12/09   cea  . COLONOSCOPY  06/16/01   by Dr. Earle Gell. positive for colonic polyp, non-adenomatous   . COLONOSCOPY WITH PROPOFOL N/A 02/03/2015   Procedure: COLONOSCOPY WITH PROPOFOL;  Surgeon: Garlan Fair, MD;  [facility];  Service: Endoscopy;  Laterality: N/A;  . GLAUCOMA REPAIR  2016   by taking out cataracts (bilateral)  . R CEA  3/11  . R inguinal hernia repair  07/05/09    FAMILY HISTORY: Family History  Problem Relation Age of Onset  . Heart disease Father        Heart Disease before age 49  . Heart attack Father   . Heart disease Mother   . Hyperlipidemia Brother   . Hyperlipidemia Sister   . Heart attack Sister        in younger sister    SOCIAL HISTORY: Social History   Social History  . Marital status: Married    Spouse name: N/A  . Number of children: 2  . Years of education: 12+   Occupational History  . [employer]   Social History Main Topics  . Smoking status: Current Every Day Smoker    Packs/day: 1.50    Types: Cigarettes  . Smokeless tobacco: Current User    Types: Chew     Comment: smoked 1 ppd for 30-40 years. (tobacco dependance)  . 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 Drug use: No  . Sexual activity: Not on file   Other Topics Concern  . Not on file   Social History  Narrative   Married, lives in [place] with his wife.   2 children (daughter, [name], is a Marine scientist on [employer] at [employer])    Patient is right handed   Patient has a high school education with some college.   Patient drinks at least 5 cups daily.              PHYSICAL EXAM  Vitals:   11/11/16 1510  BP: 139/81  Pulse: 68  Weight: 192 lb (87.1 kg)   Body mass index is 24.65 kg/m. General: well developed, well nourished, seated, in no evident distress  Head: head normocephalic and atraumatic. Oropharynx benign  Neck: supple with bil soft carotid bruits. Right CEA scar.  Cardiovascular: regular rate and rhythm, no murmurs  Neurologic Exam  Mental Status:  Awake and fully alert. Oriented to place and time. Follows all commands. Speech and language normal.  Cranial Nerves: Pupils equal, briskly reactive to light. Extraocular movements full without nystagmus. Visual fields full to confrontation. Hearing intact and symmetric to finger snap. Facial sensation intact. Face, tongue, palate move normally and symmetrically. Neck flexion and extension normal.  Motor: Normal bulk and tone. Normal strength in all tested extremity muscles.No focal weakness  Sensory.: intact to touch and pinprick and vibratory.  Coordination: Rapid alternating movements normal in all extremities. Finger-to-nose and heel-to-shin performed accurately bilaterally.  Gait and Station: Arises from chair without difficulty. Stance is normal. Gait demonstrates normal stride length and balance . Able to heel, toe and tandem walk without difficulty.  Reflexes: 2+ and symmetric. Toes downgoing.  DIAGNOSTIC DATA (LABS, IMAGING, TESTING)  ASSESSMENT AND PLAN  61 y.o. year old male  has a past medical history of Hemorrhage (1996); CVA (cerebral infarction) (05/27/09); HTN (hypertension); HLD (hyperlipidemia); ICAO (internal carotid artery occlusion); Artery stenosis; Seizure disorder; Meningitis; Stroke (March 2011);  Myocardial infarction (March 2011); and Monoclonal gammopathy of unknown significance (02/07/2013). here to follow-up. He has not had stroke or TIA symptoms. He has not had any seizure activity in several years      PLAN: Continue Agggrenox at current dose will refill Keep LDL below 100 followed by Dr. Inda Merlin Keep Systolic blood pressure less than 130, today's reading 139/81 Continue Tegretol at current dose  Call for any seizure activity Carotid Dopplers followed by CVTSlast in 2015 needs follow up Try to Stop smoking as this is an increased risk factor for stroke Will sign to get recent labs from Dr. Inda Merlin Follow-up in one year Dennie Bible, Surgery [facility], [facility], [facility] [address], [place], [place] [address] [phone]

## 2016-11-11 ENCOUNTER — Encounter (INDEPENDENT_AMBULATORY_CARE_PROVIDER_SITE_OTHER): Payer: Self-pay

## 2016-11-11 ENCOUNTER — Ambulatory Visit (INDEPENDENT_AMBULATORY_CARE_PROVIDER_SITE_OTHER): Payer: 59 | Admitting: Nurse Practitioner

## 2016-11-11 ENCOUNTER — Telehealth: Payer: Self-pay | Admitting: Nurse Practitioner

## 2016-11-11 ENCOUNTER — Encounter: Payer: Self-pay | Admitting: Nurse Practitioner

## 2016-11-11 VITALS — BP 139/81 | HR 68 | Wt 192.0 lb

## 2016-11-11 DIAGNOSIS — E785 Hyperlipidemia, unspecified: Secondary | ICD-10-CM

## 2016-11-11 DIAGNOSIS — Z8673 Personal history of transient ischemic attack (TIA), and cerebral infarction without residual deficits: Secondary | ICD-10-CM

## 2016-11-11 DIAGNOSIS — I10 Essential (primary) hypertension: Secondary | ICD-10-CM

## 2016-11-11 DIAGNOSIS — G40409 Other generalized epilepsy and epileptic syndromes, not intractable, without status epilepticus: Secondary | ICD-10-CM

## 2016-11-11 MED ORDER — ASPIRIN-DIPYRIDAMOLE ER 25-200 MG PO CP12
1.0000 | ORAL_CAPSULE | Freq: Two times a day (BID) | ORAL | 3 refills | Status: DC
Start: 2016-11-11 — End: 2017-11-11

## 2016-11-11 MED ORDER — TEGRETOL-XR 400 MG PO TB12: 400.0000 mg | ORAL_TABLET | Freq: Four times a day (QID) | ORAL | 3 refills | Status: DC

## 2016-11-11 NOTE — Telephone Encounter (Signed)
Please let patient know in reviewing his chart after he was gone today that I noticed that his last carotid Doppler was done in 2015 and he did not follow up for repeat. Due to his history he needs to follow-up with CVT S for repeat Doppler's.

## 2016-11-11 NOTE — Patient Instructions (Signed)
Continue Agggrenox at current dose will refill Keep LDL below 100 followed by Dr. Inda Merlin Keep Systolic blood pressure less than 130, today's reading 139/81 Continue Tegretol at current dose  Call for any seizure activity Carotid Dopplers followed by CVTS Try to Stop smoking as this is an increased risk factor for stroke Will sign to get recent labs from Dr. Inda Merlin Follow-up in one year

## 2016-11-11 NOTE — Telephone Encounter (Signed)
Left detailed VM advising patient that [name] wants him to follow up for repeat carotid doppler.she advised that with his medical history he must have this repeated.  Left number for any questions.

## 2016-11-14 NOTE — Progress Notes (Signed)
Agree with above plan. 

## 2016-11-16 ENCOUNTER — Telehealth: Payer: Self-pay | Admitting: Neurology

## 2016-11-16 NOTE — Telephone Encounter (Signed)
We have received the results on this patient done on 07/15/2016.  Glucose 93, creatinine of 0.97, sodium 136, potassium 4.5, BUN of 21, chloride 100, CO2 30, calcium 9.5. Carbamazepine level was 11.8. White blood count of 7.0, hemoglobin of 14.4, hematocrit of 41.5, MCV of 94.6, platelets of 217. Liver profile was unremarkable, albumin of 4.3, total protein 7.1, PSA of 00.30. TSH is 1.26. Vitamin D level of 32.   Total cholesterol 166, triglycerides of 113, HDL the 39, LDL of 104.

## 2016-11-18 ENCOUNTER — Ambulatory Visit: Payer: 59 | Admitting: Nurse Practitioner

## 2016-12-12 ENCOUNTER — Encounter (HOSPITAL_BASED_OUTPATIENT_CLINIC_OR_DEPARTMENT_OTHER): Payer: Self-pay | Admitting: Emergency Medicine

## 2016-12-12 ENCOUNTER — Inpatient Hospital Stay (HOSPITAL_BASED_OUTPATIENT_CLINIC_OR_DEPARTMENT_OTHER)
Admission: EM | Admit: 2016-12-12 | Discharge: 2016-12-15 | DRG: 872 | Disposition: A | Discharge status: Home / Self Care | Payer: 59 | Attending: Internal Medicine | Admitting: Internal Medicine

## 2016-12-12 ENCOUNTER — Emergency Department (HOSPITAL_BASED_OUTPATIENT_CLINIC_OR_DEPARTMENT_OTHER): Payer: 59

## 2016-12-12 DIAGNOSIS — Z7951 Long term (current) use of inhaled steroids: Secondary | ICD-10-CM | POA: Diagnosis not present

## 2016-12-12 DIAGNOSIS — I340 Nonrheumatic mitral (valve) insufficiency: Secondary | ICD-10-CM | POA: Diagnosis not present

## 2016-12-12 DIAGNOSIS — R7989 Other specified abnormal findings of blood chemistry: Secondary | ICD-10-CM | POA: Diagnosis present

## 2016-12-12 DIAGNOSIS — N179 Acute kidney failure, unspecified: Secondary | ICD-10-CM | POA: Diagnosis not present

## 2016-12-12 DIAGNOSIS — Z79899 Other long term (current) drug therapy: Secondary | ICD-10-CM | POA: Diagnosis not present

## 2016-12-12 DIAGNOSIS — I252 Old myocardial infarction: Secondary | ICD-10-CM

## 2016-12-12 DIAGNOSIS — D72828 Other elevated white blood cell count: Secondary | ICD-10-CM

## 2016-12-12 DIAGNOSIS — E876 Hypokalemia: Secondary | ICD-10-CM | POA: Diagnosis present

## 2016-12-12 DIAGNOSIS — A419 Sepsis, unspecified organism: Secondary | ICD-10-CM | POA: Diagnosis not present

## 2016-12-12 DIAGNOSIS — D472 Monoclonal gammopathy: Secondary | ICD-10-CM | POA: Diagnosis present

## 2016-12-12 DIAGNOSIS — R710 Precipitous drop in hematocrit: Secondary | ICD-10-CM | POA: Diagnosis not present

## 2016-12-12 DIAGNOSIS — I6529 Occlusion and stenosis of unspecified carotid artery: Secondary | ICD-10-CM | POA: Diagnosis present

## 2016-12-12 DIAGNOSIS — E785 Hyperlipidemia, unspecified: Secondary | ICD-10-CM | POA: Diagnosis present

## 2016-12-12 DIAGNOSIS — I10 Essential (primary) hypertension: Secondary | ICD-10-CM | POA: Diagnosis present

## 2016-12-12 DIAGNOSIS — G40909 Epilepsy, unspecified, not intractable, without status epilepticus: Secondary | ICD-10-CM | POA: Diagnosis present

## 2016-12-12 DIAGNOSIS — N390 Urinary tract infection, site not specified: Secondary | ICD-10-CM | POA: Diagnosis present

## 2016-12-12 DIAGNOSIS — F17210 Nicotine dependence, cigarettes, uncomplicated: Secondary | ICD-10-CM | POA: Diagnosis present

## 2016-12-12 DIAGNOSIS — Z8673 Personal history of transient ischemic attack (TIA), and cerebral infarction without residual deficits: Secondary | ICD-10-CM | POA: Diagnosis not present

## 2016-12-12 DIAGNOSIS — H409 Unspecified glaucoma: Secondary | ICD-10-CM | POA: Diagnosis present

## 2016-12-12 DIAGNOSIS — J449 Chronic obstructive pulmonary disease, unspecified: Secondary | ICD-10-CM | POA: Diagnosis not present

## 2016-12-12 DIAGNOSIS — R9431 Abnormal electrocardiogram [ECG] [EKG]: Secondary | ICD-10-CM | POA: Diagnosis not present

## 2016-12-12 DIAGNOSIS — D638 Anemia in other chronic diseases classified elsewhere: Secondary | ICD-10-CM | POA: Diagnosis present

## 2016-12-12 DIAGNOSIS — E860 Dehydration: Secondary | ICD-10-CM | POA: Diagnosis present

## 2016-12-12 DIAGNOSIS — Z8249 Family history of ischemic heart disease and other diseases of the circulatory system: Secondary | ICD-10-CM

## 2016-12-12 DIAGNOSIS — D72829 Elevated white blood cell count, unspecified: Secondary | ICD-10-CM | POA: Diagnosis present

## 2016-12-12 DIAGNOSIS — I959 Hypotension, unspecified: Secondary | ICD-10-CM | POA: Diagnosis present

## 2016-12-12 DIAGNOSIS — R079 Chest pain, unspecified: Secondary | ICD-10-CM | POA: Diagnosis not present

## 2016-12-12 DIAGNOSIS — R748 Abnormal levels of other serum enzymes: Secondary | ICD-10-CM | POA: Diagnosis not present

## 2016-12-12 LAB — CBC WITH DIFFERENTIAL/PLATELET
BASOS PCT: 0 %
Basophils Absolute: 0 10*3/uL (ref 0.0–0.1)
EOS ABS: 0 10*3/uL (ref 0.0–0.7)
EOS PCT: 0 %
HCT: 33.7 % — ABNORMAL LOW (ref 39.0–52.0)
HEMOGLOBIN: 11.4 g/dL — AB (ref 13.0–17.0)
LYMPHS ABS: 0.8 10*3/uL (ref 0.7–4.0)
Lymphocytes Relative: 5 %
MCH: 31.6 pg (ref 26.0–34.0)
MCHC: 33.8 g/dL (ref 30.0–36.0)
MCV: 93.4 fL (ref 78.0–100.0)
MONOS PCT: 13 %
Monocytes Absolute: 2.2 10*3/uL — ABNORMAL HIGH (ref 0.1–1.0)
NEUTROS ABS: 13.7 10*3/uL — AB (ref 1.7–7.7)
NEUTROS PCT: 82 %
PLATELETS: 223 10*3/uL (ref 150–400)
RBC: 3.61 MIL/uL — AB (ref 4.22–5.81)
RDW: 12 % (ref 11.5–15.5)
WBC: 16.7 10*3/uL — AB (ref 4.0–10.5)

## 2016-12-12 LAB — COMPREHENSIVE METABOLIC PANEL
ALT: 34 U/L (ref 17–63)
ANION GAP: 10 (ref 5–15)
AST: 30 U/L (ref 15–41)
Albumin: 3.3 g/dL — ABNORMAL LOW (ref 3.5–5.0)
Alkaline Phosphatase: 107 U/L (ref 38–126)
BUN: 28 mg/dL — ABNORMAL HIGH (ref 6–20)
CHLORIDE: 100 mmol/L — AB (ref 101–111)
CO2: 24 mmol/L (ref 22–32)
Calcium: 8.6 mg/dL — ABNORMAL LOW (ref 8.9–10.3)
Creatinine, Ser: 1.6 mg/dL — ABNORMAL HIGH (ref 0.61–1.24)
GFR calc Af Amer: 51 mL/min — ABNORMAL LOW (ref >60)
GFR calc non Af Amer: 44 mL/min — ABNORMAL LOW (ref >60)
GLUCOSE: 151 mg/dL — AB (ref 65–99)
POTASSIUM: 3.3 mmol/L — AB (ref 3.5–5.1)
SODIUM: 134 mmol/L — AB (ref 135–145)
Total Bilirubin: 1 mg/dL (ref 0.3–1.2)
Total Protein: 7.4 g/dL (ref 6.5–8.1)

## 2016-12-12 LAB — URINALYSIS, ROUTINE W REFLEX MICROSCOPIC
Glucose, UA: NEGATIVE mg/dL
KETONES UR: NEGATIVE mg/dL
NITRITE: POSITIVE — AB
PH: 6 (ref 5.0–8.0)
PROTEIN: 100 mg/dL — AB
Specific Gravity, Urine: 1.02 (ref 1.005–1.030)

## 2016-12-12 LAB — URINALYSIS, MICROSCOPIC (REFLEX)

## 2016-12-12 LAB — PROTIME-INR
INR: 1.19
PROTHROMBIN TIME: 15 s (ref 11.4–15.2)

## 2016-12-12 LAB — I-STAT CG4 LACTIC ACID, ED
Lactic Acid, Venous: 1.35 mmol/L (ref 0.5–1.9)
Lactic Acid, Venous: 0.66 mmol/L (ref 0.5–1.9)

## 2016-12-12 LAB — PROCALCITONIN: PROCALCITONIN: 0.16 ng/mL

## 2016-12-12 LAB — LACTIC ACID, PLASMA: LACTIC ACID, VENOUS: 1.2 mmol/L (ref 0.5–1.9)

## 2016-12-12 MED ORDER — SERTRALINE HCL 50 MG PO TABS
50.0000 mg | ORAL_TABLET | Freq: Every morning | ORAL | Status: DC
  Administered 2016-12-12 – 2016-12-15 (×4): 50 mg via ORAL
  Filled 2016-12-12 (×4): qty 1

## 2016-12-12 MED ORDER — SODIUM CHLORIDE 0.9 % IV BOLUS (SEPSIS)
1000.0000 mL | Freq: Once | INTRAVENOUS | Status: AC
  Administered 2016-12-12: 1000 mL via INTRAVENOUS

## 2016-12-12 MED ORDER — DEXTROSE 5 % IV SOLN
1.0000 g | INTRAVENOUS | Status: DC
  Administered 2016-12-13 – 2016-12-14 (×2): 1 g via INTRAVENOUS
  Filled 2016-12-12 (×5): qty 10

## 2016-12-12 MED ORDER — SODIUM CHLORIDE 0.9 % IV BOLUS (SEPSIS): 1000.0000 mL | Freq: Once | INTRAVENOUS | Status: DC

## 2016-12-12 MED ORDER — ONDANSETRON HCL 4 MG/2ML IJ SOLN
4.0000 mg | Freq: Four times a day (QID) | INTRAMUSCULAR | Status: DC | PRN
  Administered 2016-12-12 – 2016-12-13 (×2): 4 mg via INTRAVENOUS
  Filled 2016-12-12 (×2): qty 2

## 2016-12-12 MED ORDER — CEFTRIAXONE SODIUM 1 G IJ SOLR
1.0000 g | Freq: Once | INTRAMUSCULAR | Status: AC
  Administered 2016-12-12: 1 g via INTRAVENOUS
  Filled 2016-12-12: qty 10

## 2016-12-12 MED ORDER — SODIUM CHLORIDE 0.9 % IV BOLUS (SEPSIS)
1000.0000 mL | Freq: Once | INTRAVENOUS | Status: DC
Start: 2016-12-12 — End: 2016-12-12

## 2016-12-12 MED ORDER — VITAMIN D 1000 UNITS PO TABS
2000.0000 [IU] | ORAL_TABLET | Freq: Two times a day (BID) | ORAL | Status: DC
Start: 2016-12-12 — End: 2016-12-15
  Administered 2016-12-12 – 2016-12-15 (×6): 2000 [IU] via ORAL
  Filled 2016-12-12 (×6): qty 2

## 2016-12-12 MED ORDER — ALPRAZOLAM 0.25 MG PO TABS: 0.2500 mg | ORAL_TABLET | Freq: Two times a day (BID) | ORAL | Status: DC | PRN

## 2016-12-12 MED ORDER — IBUPROFEN 400 MG PO TABS
600.0000 mg | ORAL_TABLET | Freq: Once | ORAL | Status: AC
  Administered 2016-12-12: 600 mg via ORAL
  Filled 2016-12-12: qty 1

## 2016-12-12 MED ORDER — ACETAMINOPHEN 325 MG PO TABS
650.0000 mg | ORAL_TABLET | Freq: Four times a day (QID) | ORAL | Status: DC | PRN
Start: 2016-12-12 — End: 2016-12-15
  Administered 2016-12-13 (×3): 650 mg via ORAL
  Filled 2016-12-12 (×4): qty 2

## 2016-12-12 MED ORDER — ACETAMINOPHEN 650 MG RE SUPP: 650.0000 mg | Freq: Four times a day (QID) | RECTAL | Status: DC | PRN

## 2016-12-12 MED ORDER — SODIUM CHLORIDE 0.9 % IV SOLN
INTRAVENOUS | Status: DC
  Administered 2016-12-12 – 2016-12-14 (×4): via INTRAVENOUS

## 2016-12-12 MED ORDER — ASPIRIN-DIPYRIDAMOLE ER 25-200 MG PO CP12
1.0000 | ORAL_CAPSULE | Freq: Two times a day (BID) | ORAL | Status: DC
  Administered 2016-12-12 – 2016-12-15 (×6): 1 via ORAL
  Filled 2016-12-12 (×7): qty 1

## 2016-12-12 MED ORDER — MOMETASONE FURO-FORMOTEROL FUM 100-5 MCG/ACT IN AERO
2.0000 | INHALATION_SPRAY | Freq: Two times a day (BID) | RESPIRATORY_TRACT | Status: DC
  Administered 2016-12-12 – 2016-12-15 (×4): 2 via RESPIRATORY_TRACT
  Filled 2016-12-12 (×2): qty 8.8

## 2016-12-12 MED ORDER — VALSARTAN-HYDROCHLOROTHIAZIDE 160-25 MG PO TABS: 0.5000 | ORAL_TABLET | Freq: Every day | ORAL | Status: DC

## 2016-12-12 MED ORDER — ACETAMINOPHEN 325 MG PO TABS
650.0000 mg | ORAL_TABLET | Freq: Once | ORAL | Status: AC | PRN
  Administered 2016-12-12: 650 mg via ORAL
  Filled 2016-12-12: qty 2

## 2016-12-12 MED ORDER — ROSUVASTATIN CALCIUM 10 MG PO TABS
10.0000 mg | ORAL_TABLET | Freq: Every evening | ORAL | Status: DC
  Administered 2016-12-12 – 2016-12-14 (×3): 10 mg via ORAL
  Filled 2016-12-12 (×3): qty 1

## 2016-12-12 MED ORDER — CARBAMAZEPINE ER 200 MG PO TB12
400.0000 mg | ORAL_TABLET | Freq: Four times a day (QID) | ORAL | Status: DC
  Administered 2016-12-12 – 2016-12-15 (×11): 400 mg via ORAL
  Filled 2016-12-12 (×15): qty 2

## 2016-12-12 MED ORDER — ONDANSETRON HCL 4 MG PO TABS: 4.0000 mg | ORAL_TABLET | Freq: Four times a day (QID) | ORAL | Status: DC | PRN

## 2016-12-12 NOTE — Progress Notes (Signed)
Patient requires step down bed. Report called to receiving RN. Patient to transfer to bed 1225.

## 2016-12-12 NOTE — ED Triage Notes (Signed)
Diarrhea and chills since Wednesday. Pt had motrin 1 hour PTA

## 2016-12-12 NOTE — Progress Notes (Signed)
71M hx htn, CVA, seizures, here with few days dysuria, fever, found to have urosepsis. Given 3 L and now normotensive. Elevated WBCs normal lactic acid. AKI as well. CXR clear. Received ceftriaxone.

## 2016-12-12 NOTE — ED Notes (Signed)
Bladder scan performed on patient. 20 mL noted on bladder scan. MD made aware.  XR called to come get patient for radiology scan.

## 2016-12-12 NOTE — H&P (Addendum)
History and Physical    [name] KZS:[id number] DOB: Jan 31, 1956 DOA: 12/12/2016  Referring MD/NP/PA: Dr. Brantley Stage  PCP: [name], MD   Outpatient Specialists: Ortho, Dr. Rodell Perna; GI, Dr. Earle Gell; Oncology, Dr. Murriel Hopper; Vascular surgery, Dr. Curt Jews  Patient coming from: home; [facility]  Chief Complaint: fevers, chills, diarrhea   HPI: [name] is a 61 y.o. male with medical history significant  For MGUS, hypertension, history of TIA and carotid stenosis, dyslipidemia who presented to [facility] with fevers, chills, weakness for past 5 days prior to this admission. Patient also reported having diarrhea for past week or so but the last episode was 2 days prior to the admission. No chest pain or shortness of breath. No blood in stool. He did have urinary frequency and burning sensation on urination. No lightheadedness or falls.  ED Course: On admission, T max was 102.88F, HR 103, RR 32, BP 82/58 which has improved with fluids given in ED and current BP is 166/82. Blood work was notable for WBC count 16.7, hgb 11.4, potassium 3.3, Cr 1.6 (baseline Cr is WNL). Lactic acid was WNL. His UA showed too many to count WBC and many bacteria, moderate leukocytes. Sepsis criteria met on admission so pt started on empiric rocephin while awaiting urine cx results.   Review of Systems:  Constitutional: Positive for fever, chills, diaphoresis, activity change, no appetite change and fatigue.  HENT: Negative for ear pain, nosebleeds, congestion, facial swelling, rhinorrhea, neck pain, neck stiffness and ear discharge.   Eyes: Negative for pain, discharge, redness, itching and visual disturbance.  Respiratory: Negative for cough, choking, chest tightness, shortness of breath, wheezing and stridor.   Cardiovascular: Negative for chest pain, palpitations and leg swelling.  Gastrointestinal: Negative for abdominal distention. Positive for diarrhea  Genitourinary: Per  GI Musculoskeletal: Negative for back pain, joint swelling, arthralgias and gait problem.  Neurological: Negative for dizziness, tremors, seizures, syncope, facial asymmetry, speech difficulty, weakness, light-headedness, numbness and headaches.  Hematological: Negative for adenopathy. Does not bruise/bleed easily.  Psychiatric/Behavioral: Negative for hallucinations, behavioral problems, confusion, dysphoric mood, decreased concentration and agitation.   Past Medical History:  Diagnosis Date  . Artery stenosis (HCC)    L carotid  . CVA (cerebral infarction) 05/27/09   TIA, posterior circulation 7/11  . Glaucoma   . Hemorrhage 1996   intracerebral  . HLD (hyperlipidemia)   . HTN (hypertension)   . ICAO (internal carotid artery occlusion)    right ICA stenosis, s/p R carotid endarterectomy on 06/13/09  . Meningitis    viral; as a child   . Monoclonal gammopathy of unknown significance 02/07/2013   No anemia,no elevation of Iggs,  no m-spike, ?monoclonal protein on IFE 9/14  . Myocardial infarction [facility]) March 2011   After surgery    . Seizure disorder ([place])    at time of [place] in 1996.   . Seizures ([place])     5 years ago  . Stroke [facility]) March 2011    Past Surgical History:  Procedure Laterality Date  . CAROTID ENDARTERECTOMY Right 06/12/09   cea  . COLONOSCOPY  06/16/01   by Dr. Earle Gell. positive for colonic polyp, non-adenomatous   . COLONOSCOPY WITH PROPOFOL N/A 02/03/2015   Procedure: COLONOSCOPY WITH PROPOFOL;  Surgeon: Garlan Fair, MD;  Location: [facility];  Service: Endoscopy;  Laterality: N/A;  . GLAUCOMA REPAIR  2016   by taking out cataracts (bilateral)  . R CEA  3/11  . R  inguinal hernia repair  07/05/09    Social history:  reports that he has been smoking Cigarettes.  He has been smoking about 1.50 packs per day. His smokeless tobacco use includes Chew. He reports that he drinks alcohol. He reports that he does not use drugs.  Ambulation: ambulates  without assistance at baseline.   No Known Allergies  Family History  Problem Relation Age of Onset  . Heart disease Father        Heart Disease before age 44  . Heart attack Father   . Heart disease Mother   . Hyperlipidemia Brother   . Hyperlipidemia Sister   . Heart attack Sister        in younger sister    Prior to Admission medications   Medication Sig Start Date End Date Taking? Authorizing Provider  ALPRAZolam Duanne Moron) 0.25 MG tablet  12/18/09  Yes [provider]  budesonide-formoterol (SYMBICORT) 80-4.5 MCG/ACT inhaler Inhale 2 puffs into the lungs 2 (two) times daily as needed (for wheezing).    Yes [provider]  Cholecalciferol (VITAMIN D) 2000 UNITS tablet Take 2,000 Units by mouth 2 (two) times daily.    Yes [provider]  dipyridamole-aspirin (AGGRENOX) 200-25 MG 12hr capsule Take 1 capsule by mouth 2 (two) times daily. 11/11/16  Yes Dennie Bible, NP  rosuvastatin (CRESTOR) 10 MG tablet Take 10 mg by mouth every evening.    Yes [provider]  sertraline (ZOLOFT) 50 MG tablet Take 50 mg by mouth every morning.    Yes [provider]  TEGRETOL-XR 400 MG 12 hr tablet Take 1 tablet (400 mg total) by mouth 4 (four) times daily. 11/11/16  Yes Dennie Bible, NP  valsartan-hydrochlorothiazide (DIOVAN-HCT) 160-25 MG per tablet Take 0.5 tablets by mouth at bedtime.    Yes [provider]  diclofenac (VOLTAREN) 75 MG EC tablet Take 1 tablet (75 mg total) by mouth 2 (two) times daily. Patient not taking: Reported on 12/12/2016 10/31/15   Wallene Huh, [place]    Physical Exam: Vitals:   12/12/16 1445 12/12/16 1500 12/12/16 1515 12/12/16 1553  BP:  (!) 159/82 (!) 152/81 (!) 156/83  Pulse:  84 87 94  Resp:  (!) 25 (!) 26 (!) 32  Temp: 98.5 F (36.9 C)   (!) 102.5 F (39.2 C)  TempSrc:    Oral  SpO2:  99% 97% 98%  Weight:        Constitutional: NAD, calm, comfortable Vitals:   12/12/16 1445 12/12/16 1500  12/12/16 1515 12/12/16 1553  BP:  (!) 159/82 (!) 152/81 (!) 156/83  Pulse:  84 87 94  Resp:  (!) 25 (!) 26 (!) 32  Temp: 98.5 F (36.9 C)   (!) 102.5 F (39.2 C)  TempSrc:    Oral  SpO2:  99% 97% 98%  Weight:       Eyes: PERRL, lids and conjunctivae normal ENMT: Mucous membranes are moist. Posterior pharynx clear of any exudate or lesions.Normal dentition.  Neck: normal, supple, no masses, no thyromegaly Respiratory: clear to auscultation bilaterally, no wheezing, no crackles. Normal respiratory effort. No accessory muscle use.  Cardiovascular: tachycardic, appreciate S1, S2  Abdomen: no tenderness, no masses palpated. No hepatosplenomegaly. Bowel sounds positive.  Musculoskeletal: no clubbing / cyanosis. No joint deformity upper and lower extremities. Good ROM, no contractures. Normal muscle tone.  Skin: no rashes, lesions, ulcers. No induration Neurologic: CN 2-12 grossly intact. Sensation intact, DTR normal. Strength 5/5 in all 4.  Psychiatric:  Normal judgment and insight. Alert and oriented x 3. Normal mood.   Labs on Admission: I have personally reviewed following labs and imaging studies  CBC:  Recent Labs Lab 12/12/16 1115  WBC 16.7*  NEUTROABS 13.7*  HGB 11.4*  HCT 33.7*  MCV 93.4  PLT 287   Basic Metabolic Panel:  Recent Labs Lab 12/12/16 1115  NA 134*  K 3.3*  CL 100*  CO2 24  GLUCOSE 151*  BUN 28*  CREATININE 1.60*  CALCIUM 8.6*   GFR: Estimated Creatinine Clearance: 54.9 mL/min (A) (by C-G formula based on SCr of 1.6 mg/dL (H)). Liver Function Tests:  Recent Labs Lab 12/12/16 1115  AST 30  ALT 34  ALKPHOS 107  BILITOT 1.0  PROT 7.4  ALBUMIN 3.3*   No results for input(s): LIPASE, AMYLASE in the last 168 hours. No results for input(s): AMMONIA in the last 168 hours. Coagulation Profile:  Recent Labs Lab 12/12/16 1115  INR 1.19   Cardiac Enzymes: No results for input(s): CKTOTAL, CKMB, CKMBINDEX, TROPONINI in the last 168 hours. BNP  (last 3 results) No results for input(s): PROBNP in the last 8760 hours. HbA1C: No results for input(s): HGBA1C in the last 72 hours. CBG: No results for input(s): GLUCAP in the last 168 hours. Lipid Profile: No results for input(s): CHOL, HDL, LDLCALC, TRIG, CHOLHDL, LDLDIRECT in the last 72 hours. Thyroid Function Tests: No results for input(s): TSH, T4TOTAL, FREET4, T3FREE, THYROIDAB in the last 72 hours. Anemia Panel: No results for input(s): VITAMINB12, FOLATE, FERRITIN, TIBC, IRON, RETICCTPCT in the last 72 hours. Urine analysis:    Component Value Date/Time   COLORURINE BROWN (A) 12/12/2016 1102   APPEARANCEUR TURBID (A) 12/12/2016 1102   LABSPEC 1.020 12/12/2016 1102   PHURINE 6.0 12/12/2016 1102   GLUCOSEU NEGATIVE 12/12/2016 1102   HGBUR LARGE (A) 12/12/2016 1102   BILIRUBINUR MODERATE (A) 12/12/2016 1102   KETONESUR NEGATIVE 12/12/2016 1102   PROTEINUR 100 (A) 12/12/2016 1102   UROBILINOGEN 0.2 05/08/2010 2320   NITRITE POSITIVE (A) 12/12/2016 1102   LEUKOCYTESUR MODERATE (A) 12/12/2016 1102   Sepsis Labs: _0 (procalcitonin:4,lacticidven:4) )No results found for this or any previous visit (from the past 240 hour(s)).   Radiological Exams on Admission: Dg Chest 2 View  Result Date: 12/12/2016 CLINICAL DATA:  Diarrhea, fevers and chills.  Nausea. EXAM: CHEST  2 VIEW COMPARISON:  07/15/2015. FINDINGS: Normal sized heart. Clear lungs. The lungs are mildly hyperexpanded with mildly prominent interstitial markings. Mild thoracic spine degenerative changes. IMPRESSION: No acute abnormality.  Stable mild changes of COPD. Electronically Signed   By: Claudie Revering M.D.   On: 12/12/2016 13:09    EKG: Pending   Assessment/Plan  Principal Problem:   Sepsis secondary to UTI ([place]) / Leukocytosis - Sepsis criteria met on admission with fever, tachycardia, tachypnea, hypoxia, hypotension, leukocytosis - Lactic acid is WNL - UA showed moderate leukocytes, too many to count  WBC and many bacteremia  - CXR showed no acute cardiopulmonary process  - Blood cultures and urine culture ordered - Started broad spectrum antibiotic: rocephin  Active Problems:   Acute renal failure (ARF) ([place]) - Due to sepsis - Continue IV fluids - Follow up BMP in am    Hypokalemia - Follow up BMP in am    Seizure disorder (HCC) - Continue tegretol    Stenosis of carotid artery - Continue aggrenox     Essential hypertension - BP meds on hold due to hypotension and sepsis    Dyslipidemia -  Continue statin therapy    Anemia of chronic disease / Monoclonal gammopathy of unknown significance - Hg stable    DVT prophylaxis: SCD's Code Status: full code Family Communication: wife and daughter at bedside Disposition Plan: home once sepsis resolves  Consults called: None    Disposition plan: Further plan will depend as patient's clinical course evolves and further radiologic and laboratory data become available.    At the time of admission, it appears that the appropriate admission status for this patient is INPATIENT .Thisis judged to be reasonable and necessary in order to provide the required intensity of service to ensure the patient's safetygiven the patient presentation of sepsis in addition to physical exam findings, radiographic and laboratory data in the context of chronic comorbidities.   Time Spent  Critical care time spent : 65 minutes examining the patient, discussing with EDP, critical care physician, coordinating care and management.The medical decision making on this patient was of high complexity, the critically ill patient is at high risk for clinical deterioration, therefore this is a level 3 visit.    Leisa Lenz MD [facility] Pager [phone]  If 7PM-7AM, please contact night-coverage [email or web address] Password [email or web address]  12/12/2016, 5:11 PM

## 2016-12-12 NOTE — ED Provider Notes (Signed)
[place] DEPT [facility] Provider Note   CSN: [id number] Arrival date & time: 12/12/16  1050     History   Chief Complaint Chief Complaint  Patient presents with  . Diarrhea    HPI [name] is a 61 y.o. male.  HPI 61 year old male who presents with diarrhea, fevers and chills. Symptoms have been ongoing since 5 days ago. He has been having profuse diarrhea, which has improved over the past day. Diarrhea is nonbloody. Not associated with recent travel or recent antibiotic use. Denies any abdominal pain. Has been nauseated but denies any vomiting. Having fevers chills and generalized weakness, and states that he has been lying in bed all week. Has also noted some dysuria and urinary frequency. Has also had cough and congestion but no chest pain or difficulty breathing. Past Medical History:  Diagnosis Date  . Artery stenosis (HCC)    L carotid  . CVA (cerebral infarction) 05/27/09   TIA, posterior circulation 7/11  . Glaucoma   . Hemorrhage 1996   intracerebral  . HLD (hyperlipidemia)   . HTN (hypertension)   . ICAO (internal carotid artery occlusion)    right ICA stenosis, s/p R carotid endarterectomy on 06/13/09  . Meningitis    viral; as a child   . Monoclonal gammopathy of unknown significance 02/07/2013   No anemia,no elevation of Iggs,  no m-spike, ?monoclonal protein on IFE 9/14  . Myocardial infarction [facility]) March 2011   After surgery    . Seizure disorder ([place])    at time of [place] in 1996.   . Seizures ([place])     5 years ago  . Stroke [facility]) March 2011    Patient Active Problem List   Diagnosis Date Noted  . Sepsis ([place]) 12/12/2016  . History of TIA (transient ischemic attack) 11/11/2016  . Hyperlipidemia 11/11/2016  . Trochanteric bursitis, left hip 06/30/2016  . Seizure disorder, grand mal ([place]) 11/13/2013  . Monoclonal gammopathy of unknown significance 02/07/2013  . Other symptoms involving cardiovascular system 09/21/2012  . Stenosis of carotid artery  09/12/2012  . Aftercare following surgery of the circulatory system, [place] 09/12/2012  . NICOTINE ADDICTION 05/19/2010  . CHEST PAIN 05/19/2010  . Elevated cholesterol 05/18/2010  . HTN (hypertension) 05/18/2010  . Seizure disorder ([place]) 05/18/2010  . TRANSIENT ISCHEMIC ATTACKS, HX OF 05/18/2010    Past Surgical History:  Procedure Laterality Date  . CAROTID ENDARTERECTOMY Right 06/12/09   cea  . COLONOSCOPY  06/16/01   by Dr. Earle Gell. positive for colonic polyp, non-adenomatous   . COLONOSCOPY WITH PROPOFOL N/A 02/03/2015   Procedure: COLONOSCOPY WITH PROPOFOL;  Surgeon: Garlan Fair, MD;  [facility];  Service: Endoscopy;  Laterality: N/A;  . GLAUCOMA REPAIR  2016   by taking out cataracts (bilateral)  . R CEA  3/11  . R inguinal hernia repair  07/05/09       Home Medications    Prior to Admission medications   Medication Sig Start Date End Date Taking? Authorizing Provider  ALPRAZolam Duanne Moron) 0.25 MG tablet  12/18/09   [provider]  budesonide-formoterol (SYMBICORT) 80-4.5 MCG/ACT inhaler Inhale 2 puffs into the lungs 2 (two) times daily as needed (for wheezing).     [provider]  Cholecalciferol (VITAMIN D) 2000 UNITS tablet Take 2,000 Units by mouth daily.      [provider]  diclofenac (VOLTAREN) 75 MG EC tablet Take 1 tablet (75 mg total) by mouth 2 (two) times daily. 10/31/15  Wallene Huh, DPM  dipyridamole-aspirin (AGGRENOX) 200-25 MG 12hr capsule Take 1 capsule by mouth 2 (two) times daily. 11/11/16   Dennie Bible, NP  rosuvastatin (CRESTOR) 10 MG tablet Take 10 mg by mouth daily.      [provider]  sertraline (ZOLOFT) 50 MG tablet Take 50 mg by mouth every morning.     [provider]  tadalafil (CIALIS) 10 MG tablet Take 10 mg by mouth daily as needed for erectile dysfunction.     [provider]  TEGRETOL-XR 400 MG 12 hr tablet Take 1 tablet (400 mg total) by mouth 4 (four)  times daily. 11/11/16   Dennie Bible, NP  valsartan-hydrochlorothiazide (DIOVAN-HCT) 160-25 MG per tablet Take 0.5 tablets by mouth at bedtime.     [provider]    Family History Family History  Problem Relation Age of Onset  . Heart disease Father        Heart Disease before age 40  . Heart attack Father   . Heart disease Mother   . Hyperlipidemia Brother   . Hyperlipidemia Sister   . Heart attack Sister        in younger sister    Social History Social History  Substance Use Topics  . Smoking status: Current Every Day Smoker    Packs/day: 1.50    Types: Cigarettes  . Smokeless tobacco: Current User    Types: Chew     Comment: smoked 1 ppd for 30-40 years. (tobacco dependance)  . 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   Review of Systems Review of Systems  Constitutional: Positive for appetite change, fatigue and fever.  HENT: Positive for congestion.   Respiratory: Positive for cough. Negative for shortness of breath.   Cardiovascular: Negative for chest pain.  Gastrointestinal: Positive for diarrhea. Negative for abdominal pain.  Genitourinary: Positive for dysuria.  All other systems reviewed and are negative.    Physical Exam Updated Vital Signs BP 114/64   Pulse 65   Temp 99.3 F (37.4 C) (Oral)   Resp (!) 21   Wt 85.7 kg (189 lb)   SpO2 95%   BMI 24.27 kg/m   Physical Exam Physical Exam  Nursing note and vitals reviewed. Constitutional: appears low energy but non-toxic, and in no acute distress Head: Normocephalic and atraumatic.  Mouth/Throat: Oropharynx is clear and dry mucous membranes.  Neck: Normal range of motion. Neck supple.  Cardiovascular: Normal rate and regular rhythm.   Pulmonary/Chest: Effort normal and breath sounds normal.  Abdominal: Soft. There is no tenderness. There is no rebound and no  guarding.  Musculoskeletal: Normal range of motion.  Neurological: Alert, no facial droop, fluent speech, moves all extremities symmetrically Skin: Skin is warm and dry.  Psychiatric: Cooperative   ED Treatments / Results  Labs (all labs ordered are listed, but only abnormal results are displayed) Labs Reviewed  COMPREHENSIVE METABOLIC PANEL - Abnormal; Notable for the following:       Result Value   Sodium 134 (*)    Potassium 3.3 (*)    Chloride 100 (*)    Glucose, Bld 151 (*)    BUN 28 (*)    Creatinine, Ser 1.60 (*)    Calcium 8.6 (*)    Albumin 3.3 (*)    GFR calc non Af Amer 44 (*)  GFR calc Af Amer 51 (*)    All other components within normal limits  CBC WITH DIFFERENTIAL/PLATELET - Abnormal; Notable for the following:    WBC 16.7 (*)    RBC 3.61 (*)    Hemoglobin 11.4 (*)    HCT 33.7 (*)    Neutro Abs 13.7 (*)    Monocytes Absolute 2.2 (*)    All other components within normal limits  URINALYSIS, ROUTINE W REFLEX MICROSCOPIC - Abnormal; Notable for the following:    Color, Urine BROWN (*)    APPearance TURBID (*)    Hgb urine dipstick LARGE (*)    Bilirubin Urine MODERATE (*)    Protein, ur 100 (*)    Nitrite POSITIVE (*)    Leukocytes, UA MODERATE (*)    All other components within normal limits  URINALYSIS, MICROSCOPIC (REFLEX) - Abnormal; Notable for the following:    Bacteria, UA MANY (*)    Squamous Epithelial / LPF 0-5 (*)    All other components within normal limits  CULTURE, BLOOD (ROUTINE X 2)  CULTURE, BLOOD (ROUTINE X 2)  URINE CULTURE  PROTIME-INR  I-STAT CG4 LACTIC ACID, ED  I-STAT CG4 LACTIC ACID, ED    EKG  EKG Interpretation  Date/Time:  Sunday December 12 2016 11:34:19 EDT Ventricular Rate:  71 PR Interval:    QRS Duration: 93 QT Interval:  398 QTC Calculation: 433 R Axis:   81 Text Interpretation:  Sinus rhythm Borderline right axis deviation noa cute changes  Confirmed by Brantley Stage [phone]) on 12/12/2016 12:42:15 PM        Radiology Dg Chest 2 View  Result Date: 12/12/2016 CLINICAL DATA:  Diarrhea, fevers and chills.  Nausea. EXAM: CHEST  2 VIEW COMPARISON:  07/15/2015. FINDINGS: Normal sized heart. Clear lungs. The lungs are mildly hyperexpanded with mildly prominent interstitial markings. Mild thoracic spine degenerative changes. IMPRESSION: No acute abnormality.  Stable mild changes of COPD. Electronically Signed   By: Claudie Revering M.D.   On: 12/12/2016 13:09    Procedures Procedures (including critical care time) CRITICAL CARE Performed by: Forde Dandy   Total critical care time: 31 minutes  Critical care time was exclusive of separately billable procedures and treating other patients.  Critical care was necessary to treat or prevent imminent or life-threatening deterioration.  Critical care was time spent personally by me on the following activities: development of treatment plan with patient and/or surrogate as well as nursing, discussions with consultants, evaluation of patient's response to treatment, examination of patient, obtaining history from patient or surrogate, ordering and performing treatments and interventions, ordering and review of laboratory studies, ordering and review of radiographic studies, pulse oximetry and re-evaluation of patient's condition.  Medications Ordered in ED Medications  cefTRIAXone (ROCEPHIN) 1 g in dextrose 5 % 50 mL IVPB (1 g Intravenous New Bag/Given 12/12/16 1418)  acetaminophen (TYLENOL) tablet 650 mg (650 mg Oral Given 12/12/16 1124)  sodium chloride 0.9 % bolus 1,000 mL (0 mLs Intravenous Stopped 12/12/16 1137)    And  sodium chloride 0.9 % bolus 1,000 mL (0 mLs Intravenous Stopped 12/12/16 1202)    And  sodium chloride 0.9 % bolus 1,000 mL (0 mLs Intravenous Stopped 12/12/16 1202)     Initial Impression / Assessment and Plan / ED Course  I have reviewed the triage vital signs and the nursing notes.  Pertinent labs & imaging results that were available  during my care of the patient were reviewed by me and considered in my  medical decision making (see chart for details).     Presents with concern for sepsis. Febrile to 101.3, initially hypotensive with systolic blood pressures in the 80s. Given 30 mL/kg bolus of IV fluids, with good response and has remained normotensive since then. Has a soft benign abdomen. No flank pain. UA suggestive of urinary tract infection which likely etiology of the sepsis. Given ceftriaxone. Chest x-ray shows no acute cardiopulmonary processes including pneumonia. Blood work notable for normal lactic acid, evidence of acute kidney injury, and mild leukocytosis of 16.7. Discussed with hospitalist service, and accepted for admission by Dr. Si Raider.  Final Clinical Impressions(s) / ED Diagnoses   Final diagnoses:  Sepsis, due to unspecified organism [facility])    New Prescriptions New Prescriptions   No medications on file     Forde Dandy, MD 12/12/16 1433

## 2016-12-12 NOTE — Progress Notes (Signed)
Pt had c/o nausea, SOB, and upon inspection, pt's BP noted to be elevated. On call physician notified at this time:    [address]: Pt c/o nausea, inability to "catch my breath"; PRN Zofran given. SpO2 90-91% on RA, fluctuates-was 95%. Also, BP high, 180's-190s SBP, though last reading 152, MAPs 100's. I will put on 2L O2 for now, do you want to address BP or monitor? Plz advise, thx.   Dr. Kennon Holter returned page, no new orders, continue to monitor, update as necessary.

## 2016-12-12 NOTE — ED Notes (Signed)
Pt unable to provide urine specimen at this time

## 2016-12-12 NOTE — ED Notes (Signed)
MD made aware of drop in patient's BP. No new orders received. Pt attempting to obtain urine specimen at this time.

## 2016-12-12 NOTE — ED Notes (Signed)
Pt to XR at this time

## 2016-12-13 LAB — CBC
HCT: 30.2 % — ABNORMAL LOW (ref 39.0–52.0)
Hemoglobin: 10.2 g/dL — ABNORMAL LOW (ref 13.0–17.0)
MCH: 30.9 pg (ref 26.0–34.0)
MCHC: 33.8 g/dL (ref 30.0–36.0)
MCV: 91.5 fL (ref 78.0–100.0)
Platelets: 211 10*3/uL (ref 150–400)
RBC: 3.3 MIL/uL — ABNORMAL LOW (ref 4.22–5.81)
RDW: 12.5 % (ref 11.5–15.5)
WBC: 21.8 10*3/uL — ABNORMAL HIGH (ref 4.0–10.5)

## 2016-12-13 LAB — GASTROINTESTINAL PANEL BY PCR, STOOL (REPLACES STOOL CULTURE)

## 2016-12-13 LAB — BASIC METABOLIC PANEL
ANION GAP: 10 (ref 5–15)
BUN: 25 mg/dL — AB (ref 6–20)
CALCIUM: 7.8 mg/dL — AB (ref 8.9–10.3)
CO2: 18 mmol/L — AB (ref 22–32)
Chloride: 110 mmol/L (ref 101–111)
Creatinine, Ser: 1.33 mg/dL — ABNORMAL HIGH (ref 0.61–1.24)
GFR calc Af Amer: >60 mL/min (ref >60)
GFR, EST NON AFRICAN AMERICAN: 55 mL/min — AB (ref >60)
GLUCOSE: 107 mg/dL — AB (ref 65–99)
Potassium: 3.4 mmol/L — ABNORMAL LOW (ref 3.5–5.1)
Sodium: 138 mmol/L (ref 135–145)

## 2016-12-13 LAB — RESPIRATORY PANEL BY PCR

## 2016-12-13 LAB — C DIFFICILE QUICK SCREEN W PCR REFLEX
C DIFFICLE (CDIFF) ANTIGEN: NEGATIVE
C Diff interpretation: NOT DETECTED
C Diff toxin: NEGATIVE

## 2016-12-13 LAB — GLUCOSE, CAPILLARY: GLUCOSE-CAPILLARY: 96 mg/dL (ref 65–99)

## 2016-12-13 LAB — TROPONIN I
Troponin I: 0.05 ng/mL (ref <0.03)
Troponin I: 0.05 ng/mL (ref <0.03)

## 2016-12-13 LAB — MRSA PCR SCREENING: MRSA BY PCR: NEGATIVE

## 2016-12-13 MED ORDER — HYDROCODONE-ACETAMINOPHEN 5-325 MG PO TABS: 1.0000 | ORAL_TABLET | Freq: Four times a day (QID) | ORAL | Status: DC | PRN

## 2016-12-13 MED ORDER — POTASSIUM CHLORIDE CRYS ER 20 MEQ PO TBCR
40.0000 meq | EXTENDED_RELEASE_TABLET | Freq: Once | ORAL | Status: AC
  Administered 2016-12-13: 40 meq via ORAL
  Filled 2016-12-13: qty 2

## 2016-12-13 NOTE — Progress Notes (Signed)
Pt. Only wanted to take one puff.

## 2016-12-13 NOTE — Progress Notes (Signed)
CRITICAL VALUE ALERT  Critical Value:  Troponin 0.05  Date & Time Notied:  12-13-16  Provider Notified: A. Charlies Silvers  Orders Received/Actions taken: MD ordered troponins to be drawn two more times at six hours apart. Will place order

## 2016-12-13 NOTE — Care Management Note (Signed)
Case Management Note  Patient Details  Name: [name] MRN: [id number] Date of Birth: 09/15/1955  Subjective/Objective:    sepsis                Action/Plan: Date:  December 13, 2016 Chart reviewed for concurrent status and case management needs. Will continue to follow patient progress. Discharge Planning: following for needs Expected discharge date: [id number] Velva Harman, BSN, [place], [place]  Expected Discharge Date:  12/16/16               Expected Discharge Plan:  Home/Self Care  In-House Referral:     Discharge planning Services  CM Consult  Post Acute Care Choice:    Choice offered to:     DME Arranged:    DME Agency:     HH Arranged:    HH Agency:     Status of Service:  In process, will continue to follow  If discussed at Long Length of Stay Meetings, dates discussed:    Additional Comments:  Leeroy Cha, RN 12/13/2016, 8:35 AM

## 2016-12-13 NOTE — Progress Notes (Signed)
Patient ID: [name], male   DOB: 1956-01-26, 61 y.o.   MRN: [id number]  PROGRESS NOTE    QUANTAVIUS HUMM  YTK:[id number] DOB: 01-07-1956 DOA: 12/12/2016  PCP: [name], MD   Brief Narrative:  61 y.o. male with medical history significant for MGUS, hypertension, history of TIA and carotid stenosis, dyslipidemia who presented to [facility] with fevers, chills, weakness for past 5 days prior to this admission. Patient also reported having diarrhea for past week or so but the last episode was 2 days prior to the admission. No chest pain or shortness of breath. No blood in stool. He did have urinary frequency and burning sensation on urination.  On admission, T max was 102.54F, HR 103, RR 32, BP 82/58 which has improved with fluids given in ED and current BP is 166/82. Blood work was notable for WBC count 16.7, hgb 11.4, potassium 3.3, Cr 1.6 (baseline Cr is WNL). Lactic acid was WNL. His UA showed too many to count WBC and many bacteria, moderate leukocytes. Sepsis criteria met on admission so pt started on empiric rocephin while awaiting urine cx results.    Assessment & Plan:   Principal Problem:   Sepsis secondary to UTI ([place]) / Leukocytosis - Sepsis criteria met on the admission, source is urinary tract infection - UA showed moderate leukocytes - Chest x-ray showed no acute cardiopulmonary process - Urine culture and blood cultures are pending - Lactic acid is within normal limits - Continue Rocephin   Active Problems:   Acute renal failure (ARF) (HCC) - Secondary to sepsis  - Creatinine improving with IV fluids    Hypokalemia - Due to sepsis  - Supplemented     Seizure disorder (HCC) - continue Tegretol     Stenosis of carotid artery - Continue Aggrenox     Essential hypertension - Holding blood pressure medications, patient was hypotensive on admission     Dyslipidemia - Continue statin therapy    Anemia of chronic disease / Monoclonal gammopathy of unknown  significance - Hemoglobin stable   DVT prophylaxis: SCDs Code Status: full code  Family Communication: Spoke with family 12/12/2016 on admission, no family this morning Disposition Plan: Remains in step down unit due to sepsis   Consultants:   None   Procedures:   None   Antimicrobials:   Rocephin 12/12/2016 -->   Subjective: Says he is tired.  Objective: Vitals:   12/13/16 0527 12/13/16 0531 12/13/16 0600 12/13/16 0700  BP:  (!) 152/70 138/60 (!) 126/57  Pulse:  100    Resp: 17 (!) 22 (!) 25 (!) 24  Temp: (!) 102.9 F (39.4 C)     TempSrc: Oral     SpO2:  94% 95% 97%  Weight:      Height:        Intake/Output Summary (Last 24 hours) at 12/13/16 0825 Last data filed at 12/13/16 0546  Gross per 24 hour  Intake             1750 ml  Output              650 ml  Net             1100 ml   Filed Weights   12/12/16 1059 12/12/16 1700  Weight: 85.7 kg (189 lb) 83.9 kg (185 lb)    Examination:  General exam: Appears calm and comfortable  Respiratory system: Clear to auscultation. Respiratory effort normal. Cardiovascular system: S1 & S2 heard, tachycardic  Gastrointestinal  system: Abdomen is nondistended, soft and nontender. No organomegaly or masses felt. Normal bowel sounds heard. Central nervous system: Alert and oriented. No focal neurological deficits. Extremities: Symmetric 5 x 5 power. Skin: No rashes, lesions or ulcers Psychiatry: Judgement and insight appear normal. Mood & affect appropriate.   Data Reviewed: I have personally reviewed following labs and imaging studies  CBC:  Recent Labs Lab 12/12/16 1115 12/13/16 0302  WBC 16.7* 21.8*  NEUTROABS 13.7*  --   HGB 11.4* 10.2*  HCT 33.7* 30.2*  MCV 93.4 91.5  PLT 223 315   Basic Metabolic Panel:  Recent Labs Lab 12/12/16 1115 12/13/16 0302  NA 134* 138  K 3.3* 3.4*  CL 100* 110  CO2 24 18*  GLUCOSE 151* 107*  BUN 28* 25*  CREATININE 1.60* 1.33*  CALCIUM 8.6* 7.8*   GFR: Estimated  Creatinine Clearance: 66.1 mL/min (A) (by C-G formula based on SCr of 1.33 mg/dL (H)). Liver Function Tests:  Recent Labs Lab 12/12/16 1115  AST 30  ALT 34  ALKPHOS 107  BILITOT 1.0  PROT 7.4  ALBUMIN 3.3*   No results for input(s): LIPASE, AMYLASE in the last 168 hours. No results for input(s): AMMONIA in the last 168 hours. Coagulation Profile:  Recent Labs Lab 12/12/16 1115  INR 1.19   Cardiac Enzymes: No results for input(s): CKTOTAL, CKMB, CKMBINDEX, TROPONINI in the last 168 hours. BNP (last 3 results) No results for input(s): PROBNP in the last 8760 hours. HbA1C: No results for input(s): HGBA1C in the last 72 hours. CBG: No results for input(s): GLUCAP in the last 168 hours. Lipid Profile: No results for input(s): CHOL, HDL, LDLCALC, TRIG, CHOLHDL, LDLDIRECT in the last 72 hours. Thyroid Function Tests: No results for input(s): TSH, T4TOTAL, FREET4, T3FREE, THYROIDAB in the last 72 hours. Anemia Panel: No results for input(s): VITAMINB12, FOLATE, FERRITIN, TIBC, IRON, RETICCTPCT in the last 72 hours. Urine analysis:    Component Value Date/Time   COLORURINE BROWN (A) 12/12/2016 1102   APPEARANCEUR TURBID (A) 12/12/2016 1102   LABSPEC 1.020 12/12/2016 1102   PHURINE 6.0 12/12/2016 1102   GLUCOSEU NEGATIVE 12/12/2016 1102   HGBUR LARGE (A) 12/12/2016 1102   BILIRUBINUR MODERATE (A) 12/12/2016 1102   KETONESUR NEGATIVE 12/12/2016 1102   PROTEINUR 100 (A) 12/12/2016 1102   UROBILINOGEN 0.2 05/08/2010 2320   NITRITE POSITIVE (A) 12/12/2016 1102   LEUKOCYTESUR MODERATE (A) 12/12/2016 1102   Sepsis Labs: _0 (procalcitonin:4,lacticidven:4)   ) Recent Results (from the past 240 hour(s))  C difficile quick scan w PCR reflex     Status: None   Collection Time: 12/13/16 12:57 AM  Result Value Ref Range Status   C Diff antigen NEGATIVE NEGATIVE Final   C Diff toxin NEGATIVE NEGATIVE Final   C Diff interpretation No C. difficile detected.  Final       Radiology Studies: Dg Chest 2 View  Result Date: 12/12/2016 CLINICAL DATA:  Diarrhea, fevers and chills.  Nausea. EXAM: CHEST  2 VIEW COMPARISON:  07/15/2015. FINDINGS: Normal sized heart. Clear lungs. The lungs are mildly hyperexpanded with mildly prominent interstitial markings. Mild thoracic spine degenerative changes. IMPRESSION: No acute abnormality.  Stable mild changes of COPD. Electronically Signed   By: Claudie Revering M.D.   On: 12/12/2016 13:09        Scheduled Meds: . carbamazepine  400 mg Oral QID  . cholecalciferol  2,000 Units Oral BID  . dipyridamole-aspirin  1 capsule Oral BID  . mometasone-formoterol  2 puff Inhalation  BID  . rosuvastatin  10 mg Oral QPM  . sertraline  50 mg Oral q morning - 10a   Continuous Infusions: . sodium chloride 75 mL/hr at 12/13/16 0546  . cefTRIAXone (ROCEPHIN)  IV       LOS: 1 day    Time spent: 35 minutes  Greater than 50% of the time spent on counseling and coordinating the care.    Leisa Lenz, MD [facility] Pager [phone]  If 7PM-7AM, please contact night-coverage [email or web address] Password [email or web address] 12/13/2016, 8:25 AM

## 2016-12-14 DIAGNOSIS — R079 Chest pain, unspecified: Secondary | ICD-10-CM

## 2016-12-14 DIAGNOSIS — R748 Abnormal levels of other serum enzymes: Secondary | ICD-10-CM

## 2016-12-14 LAB — BASIC METABOLIC PANEL
ANION GAP: 8 (ref 5–15)
BUN: 20 mg/dL (ref 6–20)
CALCIUM: 8 mg/dL — AB (ref 8.9–10.3)
CO2: 22 mmol/L (ref 22–32)
Chloride: 111 mmol/L (ref 101–111)
Creatinine, Ser: 1.11 mg/dL (ref 0.61–1.24)
Glucose, Bld: 108 mg/dL — ABNORMAL HIGH (ref 65–99)
POTASSIUM: 3.5 mmol/L (ref 3.5–5.1)
Sodium: 141 mmol/L (ref 135–145)

## 2016-12-14 LAB — CBC
HEMATOCRIT: 27.7 % — AB (ref 39.0–52.0)
HEMOGLOBIN: 9.3 g/dL — AB (ref 13.0–17.0)
MCH: 31.7 pg (ref 26.0–34.0)
MCHC: 33.6 g/dL (ref 30.0–36.0)
MCV: 94.5 fL (ref 78.0–100.0)
Platelets: 216 10*3/uL (ref 150–400)
RBC: 2.93 MIL/uL — AB (ref 4.22–5.81)
RDW: 13 % (ref 11.5–15.5)
WBC: 13 10*3/uL — AB (ref 4.0–10.5)

## 2016-12-14 LAB — TROPONIN I: Troponin I: 0.04 ng/mL (ref <0.03)

## 2016-12-14 LAB — HIV ANTIBODY (ROUTINE TESTING W REFLEX): HIV Screen 4th Generation wRfx: NONREACTIVE

## 2016-12-14 LAB — GLUCOSE, CAPILLARY: Glucose-Capillary: 103 mg/dL — ABNORMAL HIGH (ref 65–99)

## 2016-12-14 NOTE — Progress Notes (Signed)
CRITICAL VALUE ALERT  Critical Value:  Troponin 0.05  Date & Time Notied:  12/13/16  2130  Provider Notified:Yes  Orders Received/Actions taken: -

## 2016-12-14 NOTE — Progress Notes (Signed)
Patient ID: [name], male   DOB: 10/06/1955, 61 y.o.   MRN: [id number]  PROGRESS NOTE    [name]  MPN:[id number] DOB: April 02, 1956 DOA: 12/12/2016  PCP: [name], MD   Brief Narrative:  61 y.o. male with medical history significant for MGUS, hypertension, history of TIA and carotid stenosis, dyslipidemia who presented to [facility] with fevers, chills, weakness for past 5 days prior to this admission. Patient also reported having diarrhea for past week or so but the last episode was 2 days prior to the admission. No chest pain or shortness of breath. No blood in stool. He did have urinary frequency and burning sensation on urination.  On admission, T max was 102.56F, HR 103, RR 32, BP 82/58 which has improved with fluids given in ED and current BP is 166/82. Blood work was notable for WBC count 16.7, hgb 11.4, potassium 3.3, Cr 1.6 (baseline Cr is WNL). Lactic acid was WNL. His UA showed too many to count WBC and many bacteria, moderate leukocytes. Sepsis criteria met on admission so pt started on empiric rocephin while awaiting urine cx results.    Assessment & Plan:   Principal Problem:   Sepsis secondary to UTI ([place]) / Leukocytosis - Sepsis criteria met on the admission, source is urinary tract infection - UA showed moderate leukocytes - CXR showed no acute cardiopulmonary process - Urine cx showed no growth - Blood cx showed no growth so far - Lactic acid is WNL - Pt is on Rocephin - Hemodynamically stable at this time so will transfer to telemetry floor today   Active Problems:   Acute renal failure (ARF) (HCC) - Secondary to sepsis  - Cr improved with IV fluids     Hypokalemia - Due to sepsis  - Supplemented and WNL    Seizure disorder (HCC) - Continue Tegretol    Stenosis of carotid artery - Continue Aggrenox     Essential hypertension - Holding blood pressure medications, patient was hypotensive on admission - BP this am stable without BP meds     Dyslipidemia - Continue statin therapy     Anemia of chronic disease / Monoclonal gammopathy of unknown significance - Hgb stable - Follow up CBC nn am  DVT prophylaxis: SCD's Code Status: full code  Family Communication: no family at the bedside this am Disposition Plan: transfer to telemetry floor today    Consultants:   None   Procedures:   None    Antimicrobials:   Rocephin 12/12/2016 -->   Subjective: No overnight events.  Objective: Vitals:   12/14/16 0500 12/14/16 0519 12/14/16 0800 12/14/16 1000  BP:  129/68 (!) 166/98   Pulse:      Resp:  (!) 25 20 (!) 22  Temp:  99.4 F (37.4 C) 98.9 F (37.2 C)   TempSrc:  Oral Oral   SpO2:  96% 93% 96%  Weight: 85.2 kg (187 lb 13.3 oz)     Height:        Intake/Output Summary (Last 24 hours) at 12/14/16 1100 Last data filed at 12/14/16 1000  Gross per 24 hour  Intake             2365 ml  Output              700 ml  Net             1665 ml   Filed Weights   12/12/16 1059 12/12/16 1700 12/14/16 0500  Weight: 85.7 kg (189  lb) 83.9 kg (185 lb) 85.2 kg (187 lb 13.3 oz)   Physical Exam  Constitutional: Appears well-developed and well-nourished. No distress.  CVS: RRR, S1/S2 + Pulmonary: Effort and breath sounds normal, no stridor, rhonchi, wheezes, rales.  Abdominal: Soft. BS +,  no distension, tenderness, rebound or guarding.  Musculoskeletal: Normal range of motion. No edema and no tenderness.  Lymphadenopathy: No lymphadenopathy noted, cervical, inguinal. Neuro: Alert. Normal reflexes, muscle tone coordination. No cranial nerve deficit. Skin: Skin is warm and dry.  Psychiatric: Normal mood and affect. Behavior, judgment, thought content normal.     Data Reviewed: I have personally reviewed following labs and imaging studies  CBC:  Recent Labs Lab 12/12/16 1115 12/13/16 0302 12/14/16 0240  WBC 16.7* 21.8* 13.0*  NEUTROABS 13.7*  --   --   HGB 11.4* 10.2* 9.3*  HCT 33.7* 30.2* 27.7*  MCV 93.4 91.5  94.5  PLT 223 211 616   Basic Metabolic Panel:  Recent Labs Lab 12/12/16 1115 12/13/16 0302 12/14/16 0240  NA 134* 138 141  K 3.3* 3.4* 3.5  CL 100* 110 111  CO2 24 18* 22  GLUCOSE 151* 107* 108*  BUN 28* 25* 20  CREATININE 1.60* 1.33* 1.11  CALCIUM 8.6* 7.8* 8.0*   GFR: Estimated Creatinine Clearance: 79.2 mL/min (by C-G formula based on SCr of 1.11 mg/dL). Liver Function Tests:  Recent Labs Lab 12/12/16 1115  AST 30  ALT 34  ALKPHOS 107  BILITOT 1.0  PROT 7.4  ALBUMIN 3.3*   No results for input(s): LIPASE, AMYLASE in the last 168 hours. No results for input(s): AMMONIA in the last 168 hours. Coagulation Profile:  Recent Labs Lab 12/12/16 1115  INR 1.19   Cardiac Enzymes:  Recent Labs Lab 12/13/16 1513 12/13/16 2107 12/14/16 0240  TROPONINI 0.05* 0.05* 0.04*   BNP (last 3 results) No results for input(s): PROBNP in the last 8760 hours. HbA1C: No results for input(s): HGBA1C in the last 72 hours. CBG:  Recent Labs Lab 12/13/16 0815 12/14/16 0822  GLUCAP 96 103*   Lipid Profile: No results for input(s): CHOL, HDL, LDLCALC, TRIG, CHOLHDL, LDLDIRECT in the last 72 hours. Thyroid Function Tests: No results for input(s): TSH, T4TOTAL, FREET4, T3FREE, THYROIDAB in the last 72 hours. Anemia Panel: No results for input(s): VITAMINB12, FOLATE, FERRITIN, TIBC, IRON, RETICCTPCT in the last 72 hours. Urine analysis:    Component Value Date/Time   COLORURINE BROWN (A) 12/12/2016 1102   APPEARANCEUR TURBID (A) 12/12/2016 1102   LABSPEC 1.020 12/12/2016 1102   PHURINE 6.0 12/12/2016 1102   GLUCOSEU NEGATIVE 12/12/2016 1102   HGBUR LARGE (A) 12/12/2016 1102   BILIRUBINUR MODERATE (A) 12/12/2016 1102   KETONESUR NEGATIVE 12/12/2016 1102   PROTEINUR 100 (A) 12/12/2016 1102   UROBILINOGEN 0.2 05/08/2010 2320   NITRITE POSITIVE (A) 12/12/2016 1102   LEUKOCYTESUR MODERATE (A) 12/12/2016 1102   Sepsis  Labs: '@LABRCNTIP' (procalcitonin:4,lacticidven:4)   ) Recent Results (from the past 240 hour(s))  Culture, blood (Routine x 2)     Status: None (Preliminary result)   Collection Time: 12/12/16 11:15 AM  Result Value Ref Range Status   Specimen Description BLOOD LEFT FOREARM  Final   Special Requests   Final    BOTTLES DRAWN AEROBIC AND ANAEROBIC Blood Culture adequate volume   Culture   Final    NO GROWTH < 24 HOURS Performed at [facility], [place] [address] [place], [place] [address]    Report Status PENDING  Incomplete  Culture, blood (  Routine x 2)     Status: None (Preliminary result)   Collection Time: 12/12/16 11:18 AM  Result Value Ref Range Status   Specimen Description BLOOD RIGHT FOREARM  Final   Special Requests   Final    BOTTLES DRAWN AEROBIC AND ANAEROBIC Blood Culture results may not be optimal due to an excessive volume of blood received in culture bottles   Culture   Final    NO GROWTH < 24 HOURS Performed at [facility], [address] [place], [place] [address]    Report Status PENDING  Incomplete  Urine culture     Status: Abnormal (Preliminary result)   Collection Time: 12/12/16 11:37 AM  Result Value Ref Range Status   Specimen Description URINE, RANDOM  Final   Special Requests NONE  Final   Culture >=100,000 COLONIES/mL ESCHERICHIA COLI (A)  Final   Report Status PENDING  Incomplete  C difficile quick scan w PCR reflex     Status: None   Collection Time: 12/13/16 12:57 AM  Result Value Ref Range Status   C Diff antigen NEGATIVE NEGATIVE Final   C Diff toxin NEGATIVE NEGATIVE Final   C Diff interpretation No C. difficile detected.  Final  Respiratory Panel by PCR     Status: None   Collection Time: 12/13/16  8:30 AM  Result Value Ref Range Status   Adenovirus NOT DETECTED NOT DETECTED Final   Coronavirus 229E NOT DETECTED NOT DETECTED Final   Coronavirus HKU1 NOT DETECTED NOT DETECTED Final   Coronavirus NL63 NOT DETECTED NOT DETECTED  Final   Coronavirus OC43 NOT DETECTED NOT DETECTED Final   Metapneumovirus NOT DETECTED NOT DETECTED Final   Rhinovirus / Enterovirus NOT DETECTED NOT DETECTED Final   Influenza A NOT DETECTED NOT DETECTED Final   Influenza B NOT DETECTED NOT DETECTED Final   Parainfluenza Virus 1 NOT DETECTED NOT DETECTED Final   Parainfluenza Virus 2 NOT DETECTED NOT DETECTED Final   Parainfluenza Virus 3 NOT DETECTED NOT DETECTED Final   Parainfluenza Virus 4 NOT DETECTED NOT DETECTED Final   Respiratory Syncytial Virus NOT DETECTED NOT DETECTED Final   Bordetella pertussis NOT DETECTED NOT DETECTED Final   Chlamydophila pneumoniae NOT DETECTED NOT DETECTED Final   Mycoplasma pneumoniae NOT DETECTED NOT DETECTED Final    Comment: Performed at [facility], [place] [address] [place], [place] [address]  Gastrointestinal Panel by PCR , Stool     Status: None   Collection Time: 12/13/16  8:55 AM  Result Value Ref Range Status   Campylobacter species NOT DETECTED NOT DETECTED Final   Plesimonas shigelloides NOT DETECTED NOT DETECTED Final   Salmonella species NOT DETECTED NOT DETECTED Final   Yersinia enterocolitica NOT DETECTED NOT DETECTED Final   Vibrio species NOT DETECTED NOT DETECTED Final   Vibrio cholerae NOT DETECTED NOT DETECTED Final   Enteroaggregative E coli (EAEC) NOT DETECTED NOT DETECTED Final   Enteropathogenic E coli (EPEC) NOT DETECTED NOT DETECTED Final   Enterotoxigenic E coli (ETEC) NOT DETECTED NOT DETECTED Final   Shiga like toxin producing E coli (STEC) NOT DETECTED NOT DETECTED Final   Shigella/Enteroinvasive E coli (EIEC) NOT DETECTED NOT DETECTED Final   Cryptosporidium NOT DETECTED NOT DETECTED Final   Cyclospora cayetanensis NOT DETECTED NOT DETECTED Final   Entamoeba histolytica NOT DETECTED NOT DETECTED Final   Giardia lamblia NOT DETECTED NOT DETECTED Final   Adenovirus F40/41 NOT DETECTED NOT DETECTED Final   Astrovirus NOT DETECTED NOT  DETECTED Final   Norovirus  GI/GII NOT DETECTED NOT DETECTED Final   Rotavirus A NOT DETECTED NOT DETECTED Final   Sapovirus (I, II, IV, and V) NOT DETECTED NOT DETECTED Final  MRSA PCR Screening     Status: None   Collection Time: 12/13/16 10:26 AM  Result Value Ref Range Status   MRSA by PCR NEGATIVE NEGATIVE Final    Comment:        The GeneXpert MRSA Assay (FDA approved for NASAL specimens only), is one component of a comprehensive MRSA colonization surveillance program. It is not intended to diagnose MRSA infection nor to guide or monitor treatment for MRSA infections.       Radiology Studies: Dg Chest 2 View  Result Date: 12/12/2016 CLINICAL DATA:  Diarrhea, fevers and chills.  Nausea. EXAM: CHEST  2 VIEW COMPARISON:  07/15/2015. FINDINGS: Normal sized heart. Clear lungs. The lungs are mildly hyperexpanded with mildly prominent interstitial markings. Mild thoracic spine degenerative changes. IMPRESSION: No acute abnormality.  Stable mild changes of COPD. Electronically Signed   By: Claudie Revering M.D.   On: 12/12/2016 13:09        Scheduled Meds: . carbamazepine  400 mg Oral QID  . cholecalciferol  2,000 Units Oral BID  . dipyridamole-aspirin  1 capsule Oral BID  . mometasone-formoterol  2 puff Inhalation BID  . rosuvastatin  10 mg Oral QPM  . sertraline  50 mg Oral q morning - 10a   Continuous Infusions: . sodium chloride 75 mL/hr at 12/14/16 0904  . cefTRIAXone (ROCEPHIN)  IV Stopped (12/13/16 1530)     LOS: 2 days    Time spent: 25 minutes  Greater than 50% of the time spent on counseling and coordinating the care.    Leisa Lenz, MD [facility] Pager [phone]  If 7PM-7AM, please contact night-coverage [email or web address] Password [email or web address] 12/14/2016, 11:00 AM

## 2016-12-14 NOTE — Consult Note (Signed)
Cardiology Consultation:   Patient ID: [name]; [id number]; 12-10-1955   Admit date: 12/12/2016 Date of Consult: 12/14/2016  Primary Care Provider: [name], MD Primary Cardiologist: New Primary Electrophysiologist:  None   Patient Profile:   [name] is a 61 y.o. male with a hx of Carotid disease, smoking CVA, sepsis  who is being seen today for the evaluation of chest pain and elevated troponin  at the request of Dr Charlies Silvers.  History of Present Illness:   Mr. [name] 61 y.o. with vascular disease post R CEA by Dr Donnetta Hutching. Still smoking No history of CAD. Had one less than 5 minute episode yesterday of SSCP while walking to bathroom Admitted with sepsis syndrome, dehydration and azotemia. Generally feeling better since hydrated and on antibiotics  Is on statin for cholesterol Indicates having normal treadmill last year ordered by Dr Inda Merlin primary. Troponin essentially negative at .04 to .05 ECG is normal with no acute changes He works hard as an Clinical biochemist No chest pain , dyspnea palpitations or syncope   Past Medical History:  Diagnosis Date  . Artery stenosis (HCC)    L carotid  . CVA (cerebral infarction) 05/27/09   TIA, posterior circulation 7/11  . Glaucoma   . Hemorrhage 1996   intracerebral  . HLD (hyperlipidemia)   . HTN (hypertension)   . ICAO (internal carotid artery occlusion)    right ICA stenosis, s/p R carotid endarterectomy on 06/13/09  . Meningitis    viral; as a child   . Monoclonal gammopathy of unknown significance 02/07/2013   No anemia,no elevation of Iggs,  no m-spike, ?monoclonal protein on IFE 9/14  . Myocardial infarction [facility]) March 2011   After surgery    . Seizure disorder ([place])    at time of [place] in 1996.   . Seizures ([place])     5 years ago  . Stroke [facility]) March 2011    Past Surgical History:  Procedure Laterality Date  . CAROTID ENDARTERECTOMY Right 06/12/09   cea  . COLONOSCOPY  06/16/01   by Dr. Earle Gell. positive for  colonic polyp, non-adenomatous   . COLONOSCOPY WITH PROPOFOL N/A 02/03/2015   Procedure: COLONOSCOPY WITH PROPOFOL;  Surgeon: Garlan Fair, MD;  Location: [facility];  Service: Endoscopy;  Laterality: N/A;  . GLAUCOMA REPAIR  2016   by taking out cataracts (bilateral)  . R CEA  3/11  . R inguinal hernia repair  07/05/09     Home Medications:  Prior to Admission medications   Medication Sig Start Date End Date Taking? Authorizing Provider  ALPRAZolam Duanne Moron) 0.25 MG tablet  12/18/09  Yes [provider]  budesonide-formoterol (SYMBICORT) 80-4.5 MCG/ACT inhaler Inhale 2 puffs into the lungs 2 (two) times daily as needed (for wheezing).    Yes [provider]  Cholecalciferol (VITAMIN D) 2000 UNITS tablet Take 2,000 Units by mouth 2 (two) times daily.    Yes [provider]  dipyridamole-aspirin (AGGRENOX) 200-25 MG 12hr capsule Take 1 capsule by mouth 2 (two) times daily. 11/11/16  Yes Dennie Bible, NP  rosuvastatin (CRESTOR) 10 MG tablet Take 10 mg by mouth every evening.    Yes [provider]  sertraline (ZOLOFT) 50 MG tablet Take 50 mg by mouth every morning.    Yes [provider]  TEGRETOL-XR 400 MG 12 hr tablet Take 1 tablet (400 mg total) by mouth 4 (four) times daily. 11/11/16  Yes Dennie Bible, NP  valsartan-hydrochlorothiazide (DIOVAN-HCT) [phone]  MG per tablet Take 0.5 tablets by mouth at bedtime.    Yes [provider]  diclofenac (VOLTAREN) 75 MG EC tablet Take 1 tablet (75 mg total) by mouth 2 (two) times daily. Patient not taking: Reported on 12/12/2016 10/31/15   Wallene Huh, DPM    Inpatient Medications: Scheduled Meds: . carbamazepine  400 mg Oral QID  . cholecalciferol  2,000 Units Oral BID  . dipyridamole-aspirin  1 capsule Oral BID  . mometasone-formoterol  2 puff Inhalation BID  . rosuvastatin  10 mg Oral QPM  . sertraline  50 mg Oral q morning - 10a   Continuous Infusions: . sodium chloride  75 mL/hr at 12/13/16 1800  . cefTRIAXone (ROCEPHIN)  IV Stopped (12/13/16 1530)   PRN Meds: acetaminophen **OR** acetaminophen, ALPRAZolam, HYDROcodone-acetaminophen, ondansetron **OR** ondansetron (ZOFRAN) IV  Allergies:   No Known Allergies  Social History:   Social History   Social History  . Marital status: Married    Spouse name: N/A  . Number of children: 2  . Years of education: 12+   Occupational History  . [place]   Social History Main Topics  . Smoking status: Current Every Day Smoker    Packs/day: 1.50    Types: Cigarettes  . Smokeless tobacco: Current User    Types: Chew     Comment: smoked 1 ppd for 30-40 years. (tobacco dependance)  . 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 Drug use: No  . Sexual activity: Not on file   Other Topics Concern  . Not on file   Social History Narrative   Married, lives in [place] with his wife.   2 children (daughter, [name], is a Marine scientist on 2700 at [employer])    Patient is right handed   Patient has a high school education with some college.   Patient drinks at least 5 cups daily.             Family History:    Family History  Problem Relation Age of Onset  . Heart disease Father        Heart Disease before age 63  . Heart attack Father   . Heart disease Mother   . Hyperlipidemia Brother   . Hyperlipidemia Sister   . Heart attack Sister        in younger sister     ROS:  Please see the history of present illness.  ROS  All other ROS reviewed and negative.     Physical Exam/Data:   Vitals:   12/14/16 0324 12/14/16 0400 12/14/16 0500 12/14/16 0519  BP:  (!) 111/32  129/68  Pulse:      Resp:  (!) 25  (!) 25  Temp: 100 F (37.8 C)   99.4 F (37.4 C)  TempSrc: Oral   Oral  SpO2:  95%  96%  Weight:   187 lb 13.3 oz (85.2 kg)   Height:        Intake/Output Summary (Last 24 hours) at 12/14/16 0722 Last data filed at  12/14/16 0600  Gross per 24 hour  Intake           2347.5 ml  Output              800 ml  Net           1547.5 ml   Filed Weights   12/12/16 1059 12/12/16 1700  12/14/16 0500  Weight: 189 lb (85.7 kg) 185 lb (83.9 kg) 187 lb 13.3 oz (85.2 kg)   Body mass index is 24.12 kg/m.  General:  White male in no distress HEENT: normal Lymph: no adenopathy Neck: no JVD bilateral carotid bruits post right CEA Endocrine:  No thryomegaly Vascular: No carotid bruits; FA pulses 2+ bilaterally without bruits  Cardiac:  normal S1, S2; RRR; no murmur   Lungs:  clear to auscultation bilaterally, no wheezing, rhonchi or rales  Abd: soft, nontender, no hepatomegaly  Ext: no edema  Bilateral femoral bruits  Musculoskeletal:  No deformities, BUE and BLE strength normal and equal Skin: warm and dry  Neuro:  CNs 2-12 intact, no focal abnormalities noted Psych:  Normal affect   EKG:  The EKG was personally reviewed and demonstrates:  NSR normal ECG  Telemetry:  Telemetry was personally reviewed and demonstrates:  NSR no arrhythmia  Relevant CV Studies: Normal ETT 2017  Laboratory Data:  Chemistry Recent Labs Lab 12/12/16 1115 12/13/16 0302 12/14/16 0240  NA 134* 138 141  K 3.3* 3.4* 3.5  CL 100* 110 111  CO2 24 18* 22  GLUCOSE 151* 107* 108*  BUN 28* 25* 20  CREATININE 1.60* 1.33* 1.11  CALCIUM 8.6* 7.8* 8.0*  GFRNONAA 44* 55* >60  GFRAA 51* >60 >60  ANIONGAP 10 10 8      Recent Labs Lab 12/12/16 1115  PROT 7.4  ALBUMIN 3.3*  AST 30  ALT 34  ALKPHOS 107  BILITOT 1.0   Hematology Recent Labs Lab 12/12/16 1115 12/13/16 0302 12/14/16 0240  WBC 16.7* 21.8* 13.0*  RBC 3.61* 3.30* 2.93*  HGB 11.4* 10.2* 9.3*  HCT 33.7* 30.2* 27.7*  MCV 93.4 91.5 94.5  MCH 31.6 30.9 31.7  MCHC 33.8 33.8 33.6  RDW 12.0 12.5 13.0  PLT 223 211 216   Cardiac Enzymes Recent Labs Lab 12/13/16 1513 12/13/16 2107 12/14/16 0240  TROPONINI 0.05* 0.05* 0.04*   No results for input(s): TROPIPOC  in the last 168 hours.  BNPNo results for input(s): BNP, PROBNP in the last 168 hours.  DDimer No results for input(s): DDIMER in the last 168 hours.  Radiology/Studies:  Dg Chest 2 View  Result Date: 12/12/2016 CLINICAL DATA:  Diarrhea, fevers and chills.  Nausea. EXAM: CHEST  2 VIEW COMPARISON:  07/15/2015. FINDINGS: Normal sized heart. Clear lungs. The lungs are mildly hyperexpanded with mildly prominent interstitial markings. Mild thoracic spine degenerative changes. IMPRESSION: No acute abnormality.  Stable mild changes of COPD. Electronically Signed   By: Claudie Revering M.D.   On: 12/12/2016 13:09    Assessment and Plan:   1. Chest pain: one isolated episode with negative troponin and no acute ECG changes Normal ETT 2017 will order echo to make sure EF normal Since being hydrated 2. PVD: needs f/u with Dr Early bilateral carotid bruits and femoral bruits with mild claudication. ASA/Statin 3. Cholesterol:  Continue statin labs with primary 4. Smoking discussed cessation and relationship to PVD 5. Sepsis: improving continue rocephin plans per primary service  Will arrange outpatient f/u with me and consider f/u stress testing  Will sign off    Signed, Jenkins Rouge, MD  12/14/2016 7:22 AM

## 2016-12-15 ENCOUNTER — Inpatient Hospital Stay (HOSPITAL_COMMUNITY): Payer: 59

## 2016-12-15 DIAGNOSIS — A419 Sepsis, unspecified organism: Principal | ICD-10-CM

## 2016-12-15 DIAGNOSIS — I340 Nonrheumatic mitral (valve) insufficiency: Secondary | ICD-10-CM

## 2016-12-15 DIAGNOSIS — N390 Urinary tract infection, site not specified: Secondary | ICD-10-CM

## 2016-12-15 LAB — CBC
HCT: 26.3 % — ABNORMAL LOW (ref 39.0–52.0)
HEMOGLOBIN: 8.9 g/dL — AB (ref 13.0–17.0)
MCH: 31.9 pg (ref 26.0–34.0)
MCHC: 33.8 g/dL (ref 30.0–36.0)
MCV: 94.3 fL (ref 78.0–100.0)
Platelets: 234 10*3/uL (ref 150–400)
RBC: 2.79 MIL/uL — AB (ref 4.22–5.81)
RDW: 12.8 % (ref 11.5–15.5)
WBC: 8.9 10*3/uL (ref 4.0–10.5)

## 2016-12-15 LAB — URINE CULTURE

## 2016-12-15 LAB — ECHOCARDIOGRAM COMPLETE
Height: 74 in
Weight: 2991.2 oz

## 2016-12-15 LAB — BASIC METABOLIC PANEL
Anion gap: 7 (ref 5–15)
BUN: 14 mg/dL (ref 6–20)
CALCIUM: 7.9 mg/dL — AB (ref 8.9–10.3)
CO2: 21 mmol/L — ABNORMAL LOW (ref 22–32)
CREATININE: 0.99 mg/dL (ref 0.61–1.24)
Chloride: 110 mmol/L (ref 101–111)
GFR calc Af Amer: >60 mL/min (ref >60)
GLUCOSE: 105 mg/dL — AB (ref 65–99)
Potassium: 3.2 mmol/L — ABNORMAL LOW (ref 3.5–5.1)
SODIUM: 138 mmol/L (ref 135–145)

## 2016-12-15 LAB — GLUCOSE, CAPILLARY: GLUCOSE-CAPILLARY: 90 mg/dL (ref 65–99)

## 2016-12-15 MED ORDER — POTASSIUM CHLORIDE CRYS ER 20 MEQ PO TBCR
40.0000 meq | EXTENDED_RELEASE_TABLET | Freq: Once | ORAL | Status: AC
  Administered 2016-12-15: 40 meq via ORAL
  Filled 2016-12-15: qty 2

## 2016-12-15 MED ORDER — PANTOPRAZOLE SODIUM 40 MG PO TBEC: 40.0000 mg | DELAYED_RELEASE_TABLET | Freq: Every day | ORAL | 0 refills | Status: DC

## 2016-12-15 MED ORDER — CEPHALEXIN 500 MG PO CAPS
500.0000 mg | ORAL_CAPSULE | Freq: Two times a day (BID) | ORAL | Status: DC
  Administered 2016-12-15: 500 mg via ORAL

## 2016-12-15 MED ORDER — CEPHALEXIN 500 MG PO CAPS: 500.0000 mg | ORAL_CAPSULE | Freq: Two times a day (BID) | ORAL | 0 refills | Status: DC

## 2016-12-15 NOTE — Progress Notes (Signed)
  Echocardiogram 2D Echocardiogram has been performed.  Tresa Res 12/15/2016, 2:46 PM

## 2016-12-15 NOTE — Care Management Note (Signed)
Case Management Note  Patient Details  Name: [name] MRN: [id number] Date of Birth: Jun 12, 1955  Subjective/Objective:                    Action/Plan:d/c home.   Expected Discharge Date:  12/16/16               Expected Discharge Plan:  Home/Self Care  In-House Referral:     Discharge planning Services  CM Consult  Post Acute Care Choice:    Choice offered to:     DME Arranged:    DME Agency:     HH Arranged:    HH Agency:     Status of Service:  Completed, signed off  If discussed at [employer] of Stay Meetings, dates discussed:    Additional Comments:  Dessa Phi, RN 12/15/2016, 11:26 AM

## 2016-12-15 NOTE — Consult Note (Addendum)
Referring Provider:  Dr. Charlies Silvers  Primary Care Physician:  [name], MD Primary Gastroenterologist:  Dr. Wynetta Emery   Reason for Consultation:  Anemia, possible upper GI bleed  HPI: [name] is a 61 y.o. male past medical history of MGUS, history of TIA admitted to the hospital for further evaluation of fever, chills and diarrhea. Upon initial evaluation, he was found to have a MAXIMUM TEMPERATURE of 102.5, tachycardia, hypotension and was subsequently diagnosed with sepsis probably from UTI. During the hospitalization, he continues to have drop in hemoglobin. GI is consulted for further evaluation.  Patient seen and examined at bedside. Wife also available at bedside. According to patient, he had shrimp on Tuesday which he think was not cooked properly. Subsequently he started having nausea and diarrhea on Thursday which lasted for 4-5 days. Describes diarrhea as multiple loose stools which were dark in color. He denied any black tarry stool or bright red blood per rectum. He denied any abdominal pain. Occasional nausea but denied any vomiting.  His diarrhea is resolved now. Last bowel movement was yesterday which was brown in color. No bowel movement today. His nausea is also resolved. He has been taking BCs powder frequently for pain since long time. No previous EGD.  Records from [facility] reviewed. Hgb 14.6 in April 2018.  Last colonoscopy in October 2016 by Dr. Wynetta Emery showed multiple small tubular adenomas. Repeat was recommended in 3 years.       Past Medical History:  Diagnosis Date  . Artery stenosis (HCC)    L carotid  . CVA (cerebral infarction) 05/27/09   TIA, posterior circulation 7/11  . Glaucoma   . Hemorrhage 1996   intracerebral  . HLD (hyperlipidemia)   . HTN (hypertension)   . ICAO (internal carotid artery occlusion)    right ICA stenosis, s/p R carotid endarterectomy on 06/13/09  . Meningitis    viral; as a child   . Monoclonal  gammopathy of unknown significance 02/07/2013   No anemia,no elevation of Iggs,  no m-spike, ?monoclonal protein on IFE 9/14  . Myocardial infarction [facility]) March 2011   After surgery    . Seizure disorder ([place])    at time of [place] in 1996.   . Seizures ([place])     5 years ago  . Stroke [facility] March 2011         Past Surgical History:  Procedure Laterality Date  . CAROTID ENDARTERECTOMY Right 06/12/09   cea  . COLONOSCOPY  06/16/01   by Dr. Earle Gell. positive for colonic polyp, non-adenomatous   . COLONOSCOPY WITH PROPOFOL N/A 02/03/2015   Procedure: COLONOSCOPY WITH PROPOFOL;  Surgeon: Garlan Fair, MD;  [facility];  Service: Endoscopy;  Laterality: N/A;  . GLAUCOMA REPAIR  2016   by taking out cataracts (bilateral)  . R CEA  3/11  . R inguinal hernia repair  07/05/09           Prior to Admission medications   Medication Sig Start Date End Date Taking? Authorizing Provider  ALPRAZolam Duanne Moron) 0.25 MG tablet  12/18/09  Yes [provider]  budesonide-formoterol (SYMBICORT) 80-4.5 MCG/ACT inhaler Inhale 2 puffs into the lungs 2 (two) times daily as needed (for wheezing).    Yes [provider]  Cholecalciferol (VITAMIN D) 2000 UNITS tablet Take 2,000 Units by mouth 2 (two) times daily.    Yes [provider]  dipyridamole-aspirin (AGGRENOX) 200-25 MG 12hr capsule Take 1 capsule by mouth 2 (two) times  daily. 11/11/16  Yes Dennie Bible, NP  rosuvastatin (CRESTOR) 10 MG tablet Take 10 mg by mouth every evening.    Yes [provider]  sertraline (ZOLOFT) 50 MG tablet Take 50 mg by mouth every morning.    Yes [provider]  TEGRETOL-XR 400 MG 12 hr tablet Take 1 tablet (400 mg total) by mouth 4 (four) times daily. 11/11/16  Yes Dennie Bible, NP  valsartan-hydrochlorothiazide (DIOVAN-HCT) 160-25 MG per tablet Take 0.5 tablets by mouth at bedtime.    Yes [provider]   diclofenac (VOLTAREN) 75 MG EC tablet Take 1 tablet (75 mg total) by mouth 2 (two) times daily. Patient not taking: Reported on 12/12/2016 10/31/15   Wallene Huh, DPM    Scheduled Meds: . carbamazepine  400 mg Oral QID  . cephALEXin  500 mg Oral Q12H  . cholecalciferol  2,000 Units Oral BID  . dipyridamole-aspirin  1 capsule Oral BID  . mometasone-formoterol  2 puff Inhalation BID  . potassium chloride  40 mEq Oral Once  . rosuvastatin  10 mg Oral QPM  . sertraline  50 mg Oral q morning - 10a   Continuous Infusions: PRN Meds:.acetaminophen **OR** acetaminophen, ALPRAZolam, HYDROcodone-acetaminophen, ondansetron **OR** ondansetron (ZOFRAN) IV     Allergies as of 12/12/2016  . (No Known Allergies)         Family History  Problem Relation Age of Onset  . Heart disease Father        Heart Disease before age 39  . Heart attack Father   . Heart disease Mother   . Hyperlipidemia Brother   . Hyperlipidemia Sister   . Heart attack Sister        in younger sister    Social History        Social History  . Marital status: Married    Spouse name: N/A  . Number of children: 2  . Years of education: 12+       Occupational History  . [employer]         Social History Main Topics  . Smoking status: Current Every Day Smoker    Packs/day: 1.50    Types: Cigarettes  . Smokeless tobacco: Current User    Types: Chew     Comment: smoked 1 ppd for 30-40 years. (tobacco dependance)  . 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 Drug use: No  . Sexual activity: Not on file       Other Topics Concern  . Not on file      Social History Narrative   Married, lives in [place] with his wife.   2 children (daughter, Janett Billow, is a Marine scientist on 2700 at [employer])    Patient is right handed   Patient has a high school education with some college.   Patient drinks at  least 5 cups daily.             Review of Systems: Review of Systems  Constitutional: Positive for chills and fever.  HENT: Negative for ear discharge, ear pain, hearing loss and tinnitus.   Eyes: Negative for blurred vision and double vision.  Respiratory: Positive for shortness of breath. Negative for cough and hemoptysis.   Cardiovascular: Negative for chest pain and palpitations.  Gastrointestinal: Positive for diarrhea and nausea. Negative for abdominal pain, blood in stool, heartburn, melena and vomiting.  Genitourinary: Positive for  dysuria.  Musculoskeletal: Negative for myalgias and neck pain.  Skin: Negative for itching and rash.  Neurological: Negative for focal weakness and loss of consciousness.  Endo/Heme/Allergies: Does not bruise/bleed easily.  Psychiatric/Behavioral: Negative for hallucinations and suicidal ideas.    Physical Exam: Vital signs:     Vitals:   12/15/16 0545 12/15/16 0817  BP: (!) 149/77   Pulse: 80   Resp: 20   Temp: 99.6 F (37.6 C)   SpO2: 92% 94%   Last BM Date: 12/14/16 Physical Exam  Constitutional: He is oriented to person, place, and time. He appears well-developed and well-nourished. No distress.  HENT:  Head: Normocephalic and atraumatic.  Mouth/Throat: Oropharynx is clear and moist.  Eyes: EOM are normal. No scleral icterus.  Neck: Normal range of motion. Neck supple. No thyromegaly present.  Cardiovascular: Normal rate, regular rhythm and normal heart sounds.   No murmur heard. Pulmonary/Chest: Effort normal and breath sounds normal. No respiratory distress.  Abdominal: Soft. Bowel sounds are normal. He exhibits no distension. There is no tenderness. There is no rebound and no guarding.  Musculoskeletal: Normal range of motion. He exhibits no edema.  Neurological: He is alert and oriented to person, place, and time.  Skin: Skin is warm. No erythema.  Psychiatric: He has a normal mood and affect. His behavior is  normal.  Nursing note and vitals reviewed.   GI:  Lab Results:  Recent Labs (last 2 labs)    Recent Labs  12/13/16 0302 12/14/16 0240 12/15/16 0538  WBC 21.8* 13.0* 8.9  HGB 10.2* 9.3* 8.9*  HCT 30.2* 27.7* 26.3*  PLT 211 216 234     BMET  Recent Labs (last 2 labs)    Recent Labs  12/13/16 0302 12/14/16 0240 12/15/16 0538  NA 138 141 138  K 3.4* 3.5 3.2*  CL 110 111 110  CO2 18* 22 21*  GLUCOSE 107* 108* 105*  BUN 25* 20 14  CREATININE 1.33* 1.11 0.99  CALCIUM 7.8* 8.0* 7.9*     LFT  Recent Labs (last 2 labs)    Recent Labs  12/12/16 1115  PROT 7.4  ALBUMIN 3.3*  AST 30  ALT 34  ALKPHOS 107  BILITOT 1.0     PT/INR  Recent Labs (last 2 labs)    Recent Labs  12/12/16 1115  LABPROT 15.0  INR 1.19       Studies/Results: Imaging Results (Last 48 hours)  No results found.    Impression/Plan: - Anemia with drop in hemoglobin. Hemoglobin on admission was 11.4. Hemoglobin today is 8.9. Patient had dark stool 3 days ago which is resolved now. He denied black tarry stool or bright blood per rectum. Last bowel movement yesterday which was brown in color. Had normal hemoglobin in April 2018. - Sepsis with UTI. Improving. - History of MGUS - Personal history of adenomatous polyp. Last colonoscopy 10 /2016 by Dr. Wynetta Emery. Repeat recommended in 3 years.  Recommendations ------------------------- - Patient with the significant drop in hemoglobin but he denied any black tarry stool or bright red blood per rectum.  Drop in hemoglobin can be explained by dilutional effect. Patient with the frequent use of BCs powder . Offered EGD for further evaluation but patient declined as he has not seen any overt bleeding. Risk of missing also discussed with the patient and wife. They verbalized understanding. - Recommend once a day PPI at least for 4 weeks. Wife initially declined use of PPI because of concerns for side effects. Risk and  benefits of  use of PPI discussed in detail and they're agreeable for short term use of PPI. - Patient was advised to avoid NSAIDs - Okay to discharge from GI standpoint  - Recommend monitoring hemoglobin by PCP 2 weeks after discharge. - Follow-up with me in 4 weeks after discharge. - GI will sign off. Call us back if needed     Review of Systems: Review of Systems    Physical Exam

## 2016-12-15 NOTE — Discharge Summary (Signed)
Physician Discharge Summary  [name]:[id number] DOB: 06/04/1955 DOA: 12/12/2016  PCP: [name], MD  Admit date: 12/12/2016 Discharge date: 12/15/2016  Time spent: 35 minutes  Recommendations for Outpatient Follow-up:  1. PCP in 1 week 2. Cards [facility] in 1 month 3. Urology in 1 month   Discharge Diagnoses:  Principal Problem:   Sepsis secondary to UTI [facility]) Active Problems:   Seizure disorder ([name])   Stenosis of carotid artery   Monoclonal gammopathy of unknown significance   History of TIA (transient ischemic attack)   Essential hypertension   Leukocytosis   Dyslipidemia   Acute renal failure (ARF) (HCC)   Anemia of chronic disease   Discharge Condition: stable  Diet recommendation: heart healthy  Filed Weights   12/12/16 1700 12/14/16 0500 12/15/16 0500  Weight: 83.9 kg (185 lb) 85.2 kg (187 lb 13.3 oz) 84.8 kg (186 lb 15.2 oz)    History of present illness:  61 y.o.malewith medical history significant for MGUS, hypertension, history of TIA and carotid stenosis, dyslipidemia who presented to [facility] with fevers, chills, weakness for past 5 days prior to this admission  Hospital Course:  Sepsis secondary to UTI ([place]) / Leukocytosis - Sepsis criteria met on the admission, source is urinary tract infection - urine Cx grew Ecoli - Abx changed from Ceftriaxone to Keflex at discharge -advised non urgent FU with Urology   Transient chest pain -minimally elevated troponin of 0.03 in setting of sepsis -seen by Cardiology Dr.Nishan, recommended ECHo and FU in office for possible stress test -ECHo noted normal EF and diastolic dysfunction, clinically no evidence of volume overload  Acute renal failure (ARF) ([place]) - Secondary to sepsis  - Cr improved with IV fluids   Hypokalemia - Due to sepsis  - Supplemented and WNL  Seizure disorder (HCC) - Continue Tegretol  Stenosis of carotid artery - Continue Aggrenox   Essential  hypertension - Holding blood pressure medications, patient was hypotensive on admission - BP this am stable without BP meds    Dyslipidemia - Continue statin therapy   Anemia of chronic disease /Monoclonal gammopathy of unknownsignificance - Hgb stable   Procedures:  ECHO: normal EF and wall motion, grade 2 diastolic dysfunction  Consultations:  [place]  Discharge Exam: Vitals:   12/15/16 0817 12/15/16 1533  BP:  (!) 149/79  Pulse:  76  Resp:  18  Temp:  97.7 F (36.5 C)  SpO2: 94% 100%    General: AAOx3 Cardiovascular: S1S2/RRR Respiratory: CTAB  Discharge Instructions   Discharge Instructions    Diet - low sodium heart healthy    Complete by:  As directed    Increase activity slowly    Complete by:  As directed      Current Discharge Medication List    START taking these medications   Details  cephALEXin (KEFLEX) 500 MG capsule Take 1 capsule (500 mg total) by mouth 2 (two) times daily. For 3days Qty: 6 capsule, Refills: 0    pantoprazole (PROTONIX) 40 MG tablet Take 1 tablet (40 mg total) by mouth daily. Qty: 30 tablet, Refills: 0      CONTINUE these medications which have NOT CHANGED   Details  ALPRAZolam (XANAX) 0.25 MG tablet     budesonide-formoterol (SYMBICORT) 80-4.5 MCG/ACT inhaler Inhale 2 puffs into the lungs 2 (two) times daily as needed (for wheezing).     Cholecalciferol (VITAMIN D) 2000 UNITS tablet Take 2,000 Units by mouth 2 (two) times daily.  dipyridamole-aspirin (AGGRENOX) 200-25 MG 12hr capsule Take 1 capsule by mouth 2 (two) times daily. Qty: 180 capsule, Refills: 3    rosuvastatin (CRESTOR) 10 MG tablet Take 10 mg by mouth every evening.     sertraline (ZOLOFT) 50 MG tablet Take 50 mg by mouth every morning.     TEGRETOL-XR 400 MG 12 hr tablet Take 1 tablet (400 mg total) by mouth 4 (four) times daily. Qty: 360 tablet, Refills: 3    valsartan-hydrochlorothiazide (DIOVAN-HCT) 160-25 MG per tablet Take  0.5 tablets by mouth at bedtime.       STOP taking these medications     diclofenac (VOLTAREN) 75 MG EC tablet        No Known Allergies Follow-up Information    Brahmbhatt, Parag, MD. Schedule an appointment as soon as possible for a visit in 4 week(s).   Specialty:  Gastroenterology Contact information: [place] [address] [phone]        Josue Hector, MD. Schedule an appointment as soon as possible for a visit in 1 month(s).   Specialty:  Cardiology Contact information: [address] [place] [address] [phone]            The results of significant diagnostics from this hospitalization (including imaging, microbiology, ancillary and laboratory) are listed below for reference.    Significant Diagnostic Studies: Dg Chest 2 View  Result Date: 12/12/2016 CLINICAL DATA:  Diarrhea, fevers and chills.  Nausea. EXAM: CHEST  2 VIEW COMPARISON:  07/15/2015. FINDINGS: Normal sized heart. Clear lungs. The lungs are mildly hyperexpanded with mildly prominent interstitial markings. Mild thoracic spine degenerative changes. IMPRESSION: No acute abnormality.  Stable mild changes of COPD. Electronically Signed   By: Claudie Revering M.D.   On: 12/12/2016 13:09    Microbiology: Recent Results (from the past 240 hour(s))  Culture, blood (Routine x 2)     Status: None (Preliminary result)   Collection Time: 12/12/16 11:15 AM  Result Value Ref Range Status   Specimen Description BLOOD LEFT FOREARM  Final   Special Requests   Final    BOTTLES DRAWN AEROBIC AND ANAEROBIC Blood Culture adequate volume   Culture   Final    NO GROWTH 3 DAYS Performed at [facility], [address], [place], [place] [address]    Report Status PENDING  Incomplete  Culture, blood (Routine x 2)     Status: None (Preliminary result)   Collection Time: 12/12/16 11:18 AM  Result Value Ref Range Status   Specimen Description BLOOD RIGHT FOREARM   Final   Special Requests   Final    BOTTLES DRAWN AEROBIC AND ANAEROBIC Blood Culture results may not be optimal due to an excessive volume of blood received in culture bottles   Culture   Final    NO GROWTH 3 DAYS Performed at [facility], [place] [address] [place], [place] [address]    Report Status PENDING  Incomplete  Urine culture     Status: Abnormal   Collection Time: 12/12/16 11:37 AM  Result Value Ref Range Status   Specimen Description URINE, RANDOM  Final   Special Requests NONE  Final   Culture >=100,000 COLONIES/mL ESCHERICHIA COLI (A)  Final   Report Status 12/15/2016 FINAL  Final   Organism ID, Bacteria ESCHERICHIA COLI (A)  Final      Susceptibility   Escherichia coli - MIC*    AMPICILLIN 4 SENSITIVE Sensitive  CEFAZOLIN <=4 SENSITIVE Sensitive     CEFTRIAXONE <=1 SENSITIVE Sensitive     CIPROFLOXACIN <=0.25 SENSITIVE Sensitive     GENTAMICIN <=1 SENSITIVE Sensitive     IMIPENEM <=0.25 SENSITIVE Sensitive     NITROFURANTOIN <=16 SENSITIVE Sensitive     TRIMETH/SULFA <=20 SENSITIVE Sensitive     AMPICILLIN/SULBACTAM <=2 SENSITIVE Sensitive     PIP/TAZO <=4 SENSITIVE Sensitive     Extended ESBL NEGATIVE Sensitive     * >=100,000 COLONIES/mL ESCHERICHIA COLI  C difficile quick scan w PCR reflex     Status: None   Collection Time: 12/13/16 12:57 AM  Result Value Ref Range Status   C Diff antigen NEGATIVE NEGATIVE Final   C Diff toxin NEGATIVE NEGATIVE Final   C Diff interpretation No C. difficile detected.  Final  Respiratory Panel by PCR     Status: None   Collection Time: 12/13/16  8:30 AM  Result Value Ref Range Status   Adenovirus NOT DETECTED NOT DETECTED Final   Coronavirus 229E NOT DETECTED NOT DETECTED Final   Coronavirus HKU1 NOT DETECTED NOT DETECTED Final   Coronavirus NL63 NOT DETECTED NOT DETECTED Final   Coronavirus OC43 NOT DETECTED NOT DETECTED Final   Metapneumovirus NOT DETECTED NOT DETECTED Final   Rhinovirus / Enterovirus NOT  DETECTED NOT DETECTED Final   Influenza A NOT DETECTED NOT DETECTED Final   Influenza B NOT DETECTED NOT DETECTED Final   Parainfluenza Virus 1 NOT DETECTED NOT DETECTED Final   Parainfluenza Virus 2 NOT DETECTED NOT DETECTED Final   Parainfluenza Virus 3 NOT DETECTED NOT DETECTED Final   Parainfluenza Virus 4 NOT DETECTED NOT DETECTED Final   Respiratory Syncytial Virus NOT DETECTED NOT DETECTED Final   Bordetella pertussis NOT DETECTED NOT DETECTED Final   Chlamydophila pneumoniae NOT DETECTED NOT DETECTED Final   Mycoplasma pneumoniae NOT DETECTED NOT DETECTED Final    Comment: Performed at [facility], [place] [address], [place], [place] [address]  Gastrointestinal Panel by PCR , Stool     Status: None   Collection Time: 12/13/16  8:55 AM  Result Value Ref Range Status   Campylobacter species NOT DETECTED NOT DETECTED Final   Plesimonas shigelloides NOT DETECTED NOT DETECTED Final   Salmonella species NOT DETECTED NOT DETECTED Final   Yersinia enterocolitica NOT DETECTED NOT DETECTED Final   Vibrio species NOT DETECTED NOT DETECTED Final   Vibrio cholerae NOT DETECTED NOT DETECTED Final   Enteroaggregative E coli (EAEC) NOT DETECTED NOT DETECTED Final   Enteropathogenic E coli (EPEC) NOT DETECTED NOT DETECTED Final   Enterotoxigenic E coli (ETEC) NOT DETECTED NOT DETECTED Final   Shiga like toxin producing E coli (STEC) NOT DETECTED NOT DETECTED Final   Shigella/Enteroinvasive E coli (EIEC) NOT DETECTED NOT DETECTED Final   Cryptosporidium NOT DETECTED NOT DETECTED Final   Cyclospora cayetanensis NOT DETECTED NOT DETECTED Final   Entamoeba histolytica NOT DETECTED NOT DETECTED Final   Giardia lamblia NOT DETECTED NOT DETECTED Final   Adenovirus F40/41 NOT DETECTED NOT DETECTED Final   Astrovirus NOT DETECTED NOT DETECTED Final   Norovirus GI/GII NOT DETECTED NOT DETECTED Final   Rotavirus A NOT DETECTED NOT DETECTED Final   Sapovirus (I, II, IV, and V) NOT DETECTED NOT  DETECTED Final  MRSA PCR Screening     Status: None   Collection Time: 12/13/16 10:26 AM  Result Value Ref Range Status   MRSA by PCR NEGATIVE NEGATIVE Final    Comment:  The GeneXpert MRSA Assay (FDA approved for NASAL specimens only), is one component of a comprehensive MRSA colonization surveillance program. It is not intended to diagnose MRSA infection nor to guide or monitor treatment for MRSA infections.      Labs: Basic Metabolic Panel:  Recent Labs Lab 12/12/16 1115 12/13/16 0302 12/14/16 0240 12/15/16 0538  NA 134* 138 141 138  K 3.3* 3.4* 3.5 3.2*  CL 100* 110 111 110  CO2 24 18* 22 21*  GLUCOSE 151* 107* 108* 105*  BUN 28* 25* 20 14  CREATININE 1.60* 1.33* 1.11 0.99  CALCIUM 8.6* 7.8* 8.0* 7.9*   Liver Function Tests:  Recent Labs Lab 12/12/16 1115  AST 30  ALT 34  ALKPHOS 107  BILITOT 1.0  PROT 7.4  ALBUMIN 3.3*   No results for input(s): LIPASE, AMYLASE in the last 168 hours. No results for input(s): AMMONIA in the last 168 hours. CBC:  Recent Labs Lab 12/12/16 1115 12/13/16 0302 12/14/16 0240 12/15/16 0538  WBC 16.7* 21.8* 13.0* 8.9  NEUTROABS 13.7*  --   --   --   HGB 11.4* 10.2* 9.3* 8.9*  HCT 33.7* 30.2* 27.7* 26.3*  MCV 93.4 91.5 94.5 94.3  PLT 223 211 216 234   Cardiac Enzymes:  Recent Labs Lab 12/13/16 1513 12/13/16 2107 12/14/16 0240  TROPONINI 0.05* 0.05* 0.04*   BNP: BNP (last 3 results) No results for input(s): BNP in the last 8760 hours.  ProBNP (last 3 results) No results for input(s): PROBNP in the last 8760 hours.  CBG:  Recent Labs Lab 12/13/16 0815 12/14/16 0822 12/15/16 0757  GLUCAP 96 103* 90       Signed:  Edgardo Petrenko MD.  [facility] 12/15/2016, 4:40 PM

## 2016-12-16 LAB — URINE CULTURE: Culture: NO GROWTH

## 2016-12-17 LAB — CULTURE, BLOOD (ROUTINE X 2)
CULTURE: NO GROWTH
Culture: NO GROWTH
SPECIAL REQUESTS: ADEQUATE

## 2016-12-27 DIAGNOSIS — N390 Urinary tract infection, site not specified: Secondary | ICD-10-CM | POA: Diagnosis not present

## 2016-12-27 DIAGNOSIS — D649 Anemia, unspecified: Secondary | ICD-10-CM | POA: Diagnosis not present

## 2017-02-04 DIAGNOSIS — Z791 Long term (current) use of non-steroidal anti-inflammatories (NSAID): Secondary | ICD-10-CM | POA: Diagnosis not present

## 2017-02-04 DIAGNOSIS — Z86010 Personal history of colonic polyps: Secondary | ICD-10-CM | POA: Diagnosis not present

## 2017-02-04 DIAGNOSIS — D649 Anemia, unspecified: Secondary | ICD-10-CM | POA: Diagnosis not present

## 2017-03-18 DIAGNOSIS — R062 Wheezing: Secondary | ICD-10-CM | POA: Diagnosis not present

## 2017-03-18 DIAGNOSIS — R509 Fever, unspecified: Secondary | ICD-10-CM | POA: Diagnosis not present

## 2017-04-19 DIAGNOSIS — J0100 Acute maxillary sinusitis, unspecified: Secondary | ICD-10-CM | POA: Diagnosis not present

## 2017-04-19 DIAGNOSIS — D649 Anemia, unspecified: Secondary | ICD-10-CM | POA: Diagnosis not present

## 2017-04-19 DIAGNOSIS — Z791 Long term (current) use of non-steroidal anti-inflammatories (NSAID): Secondary | ICD-10-CM | POA: Diagnosis not present

## 2017-04-19 DIAGNOSIS — Z23 Encounter for immunization: Secondary | ICD-10-CM | POA: Diagnosis not present

## 2017-04-27 DIAGNOSIS — Z791 Long term (current) use of non-steroidal anti-inflammatories (NSAID): Secondary | ICD-10-CM | POA: Diagnosis not present

## 2017-04-27 DIAGNOSIS — D649 Anemia, unspecified: Secondary | ICD-10-CM | POA: Diagnosis not present

## 2017-04-27 DIAGNOSIS — J020 Streptococcal pharyngitis: Secondary | ICD-10-CM | POA: Diagnosis not present

## 2017-05-23 ENCOUNTER — Emergency Department (HOSPITAL_COMMUNITY)
Admission: EM | Admit: 2017-05-23 | Discharge: 2017-05-23 | Disposition: A | Discharge status: Home / Self Care | Payer: 59 | Attending: Emergency Medicine | Admitting: Emergency Medicine

## 2017-05-23 ENCOUNTER — Other Ambulatory Visit: Payer: Self-pay

## 2017-05-23 ENCOUNTER — Encounter (HOSPITAL_COMMUNITY): Payer: Self-pay

## 2017-05-23 ENCOUNTER — Emergency Department (HOSPITAL_COMMUNITY): Payer: 59

## 2017-05-23 DIAGNOSIS — N179 Acute kidney failure, unspecified: Secondary | ICD-10-CM | POA: Insufficient documentation

## 2017-05-23 DIAGNOSIS — N453 Epididymo-orchitis: Secondary | ICD-10-CM | POA: Insufficient documentation

## 2017-05-23 DIAGNOSIS — M545 Low back pain: Secondary | ICD-10-CM | POA: Diagnosis not present

## 2017-05-23 DIAGNOSIS — F17210 Nicotine dependence, cigarettes, uncomplicated: Secondary | ICD-10-CM | POA: Diagnosis not present

## 2017-05-23 DIAGNOSIS — N50812 Left testicular pain: Secondary | ICD-10-CM

## 2017-05-23 DIAGNOSIS — M5431 Sciatica, right side: Secondary | ICD-10-CM | POA: Diagnosis not present

## 2017-05-23 DIAGNOSIS — Z7982 Long term (current) use of aspirin: Secondary | ICD-10-CM | POA: Diagnosis not present

## 2017-05-23 DIAGNOSIS — N5089 Other specified disorders of the male genital organs: Secondary | ICD-10-CM | POA: Diagnosis not present

## 2017-05-23 DIAGNOSIS — Z79899 Other long term (current) drug therapy: Secondary | ICD-10-CM | POA: Insufficient documentation

## 2017-05-23 DIAGNOSIS — I10 Essential (primary) hypertension: Secondary | ICD-10-CM | POA: Diagnosis not present

## 2017-05-23 DIAGNOSIS — M5416 Radiculopathy, lumbar region: Secondary | ICD-10-CM | POA: Diagnosis not present

## 2017-05-23 LAB — CBC WITH DIFFERENTIAL/PLATELET
Basophils Absolute: 0 10*3/uL (ref 0.0–0.1)
Basophils Relative: 0 %
Eosinophils Absolute: 0 10*3/uL (ref 0.0–0.7)
Eosinophils Relative: 0 %
HEMATOCRIT: 33.1 % — AB (ref 39.0–52.0)
HEMOGLOBIN: 11.3 g/dL — AB (ref 13.0–17.0)
LYMPHS PCT: 7 %
Lymphs Abs: 1.2 10*3/uL (ref 0.7–4.0)
MCH: 32.5 pg (ref 26.0–34.0)
MCHC: 34.1 g/dL (ref 30.0–36.0)
MCV: 95.1 fL (ref 78.0–100.0)
MONO ABS: 1.7 10*3/uL — AB (ref 0.1–1.0)
MONOS PCT: 9 %
NEUTROS ABS: 14.7 10*3/uL — AB (ref 1.7–7.7)
NEUTROS PCT: 84 %
Platelets: 234 10*3/uL (ref 150–400)
RBC: 3.48 MIL/uL — ABNORMAL LOW (ref 4.22–5.81)
RDW: 13.5 % (ref 11.5–15.5)
WBC: 17.6 10*3/uL — ABNORMAL HIGH (ref 4.0–10.5)

## 2017-05-23 LAB — BASIC METABOLIC PANEL
ANION GAP: 10 (ref 5–15)
BUN: 35 mg/dL — ABNORMAL HIGH (ref 6–20)
CALCIUM: 8.8 mg/dL — AB (ref 8.9–10.3)
CO2: 24 mmol/L (ref 22–32)
Chloride: 104 mmol/L (ref 101–111)
Creatinine, Ser: 1.43 mg/dL — ABNORMAL HIGH (ref 0.61–1.24)
GFR calc non Af Amer: 51 mL/min — ABNORMAL LOW (ref >60)
GFR, EST AFRICAN AMERICAN: 59 mL/min — AB (ref >60)
GLUCOSE: 113 mg/dL — AB (ref 65–99)
Potassium: 3.8 mmol/L (ref 3.5–5.1)
Sodium: 138 mmol/L (ref 135–145)

## 2017-05-23 MED ORDER — HYDROCODONE-ACETAMINOPHEN 5-325 MG PO TABS: 1.0000 | ORAL_TABLET | Freq: Three times a day (TID) | ORAL | 0 refills | Status: DC | PRN

## 2017-05-23 MED ORDER — ACETAMINOPHEN 325 MG PO TABS
650.0000 mg | ORAL_TABLET | Freq: Once | ORAL | Status: AC
  Administered 2017-05-23: 650 mg via ORAL
  Filled 2017-05-23: qty 2

## 2017-05-23 MED ORDER — LEVOFLOXACIN 500 MG PO TABS
500.0000 mg | ORAL_TABLET | Freq: Once | ORAL | Status: AC
  Administered 2017-05-23: 500 mg via ORAL
  Filled 2017-05-23: qty 1

## 2017-05-23 MED ORDER — LEVOFLOXACIN 500 MG PO TABS: 500.0000 mg | ORAL_TABLET | Freq: Two times a day (BID) | ORAL | 0 refills | Status: DC

## 2017-05-23 MED ORDER — ACETAMINOPHEN ER 650 MG PO TBCR: 650.0000 mg | EXTENDED_RELEASE_TABLET | Freq: Three times a day (TID) | ORAL | 0 refills | Status: DC | PRN

## 2017-05-23 NOTE — ED Triage Notes (Signed)
Patient c/o left scrotal swelling x 2 days. Patient states he sat on his scrotum when he was getting on his motorcycle about a week ago.

## 2017-05-23 NOTE — ED Provider Notes (Addendum)
[facility] DEPT Provider Note   CSN: [id number] Arrival date & time: 05/23/17  1306     History   Chief Complaint Chief Complaint  Patient presents with  . Groin Swelling    HPI [name] is a 62 y.o. male.  HPI 62 y/o with hx of HTN, HL, CAD comes in with cc of L testicle pain. Pain started 2 days ago and it is constant. Patient has no pain with urination, blood in the urine, or frequent urination. Pt had mild trauma a week ago when he was getting onto his motor cycle.  Pt also reports of back pain with radiation into R leg without numbness. Pt has no associated weakness, urinary incontinence, urinary retention, bowel incontinence, pins and needle sensation in the perineal area.    Past Medical History:  Diagnosis Date  . Artery stenosis (HCC)    L carotid  . CVA (cerebral infarction) 05/27/09   TIA, posterior circulation 7/11  . Glaucoma   . Hemorrhage 1996   intracerebral  . HLD (hyperlipidemia)   . HTN (hypertension)   . ICAO (internal carotid artery occlusion)    right ICA stenosis, s/p R carotid endarterectomy on 06/13/09  . Meningitis    viral; as a child   . Monoclonal gammopathy of unknown significance 02/07/2013   No anemia,no elevation of Iggs,  no m-spike, ?monoclonal protein on IFE 9/14  . Myocardial infarction [facility]) March 2011   After surgery    . Seizure disorder ([place])    at time of [place] in 1996.   . Seizures ([place])     5 years ago  . Stroke [facility]) March 2011    Patient Active Problem List   Diagnosis Date Noted  . Sepsis secondary to UTI ([place]) 12/12/2016  . Essential hypertension 12/12/2016  . Leukocytosis 12/12/2016  . Dyslipidemia 12/12/2016  . Acute renal failure (ARF) ([place]) 12/12/2016  . Anemia of chronic disease 12/12/2016  . History of TIA (transient ischemic attack) 11/11/2016  . Monoclonal gammopathy of unknown significance 02/07/2013  . Stenosis of carotid artery 09/12/2012  . Seizure disorder  ([place]) 05/18/2010    Past Surgical History:  Procedure Laterality Date  . CAROTID ENDARTERECTOMY Right 06/12/09   cea  . COLONOSCOPY  06/16/01   by Dr. Earle Gell. positive for colonic polyp, non-adenomatous   . COLONOSCOPY WITH PROPOFOL N/A 02/03/2015   Procedure: COLONOSCOPY WITH PROPOFOL;  Surgeon: Garlan Fair, MD;  Location: [facility] END[facility]COPY;  Service: Endoscopy;  Laterality: N/A;  . GLAUCOMA REPAIR  2016   by taking out cataracts (bilateral)  . R CEA  3/11  . R inguinal hernia repair  07/05/09       Home Medications    Prior to Admission medications   Medication Sig Start Date End Date Taking? Authorizing Provider  ALPRAZolam Duanne Moron) 0.25 MG tablet  12/18/09  Yes [provider]  budesonide-formoterol (SYMBICORT) 80-4.5 MCG/ACT inhaler Inhale 2 puffs into the lungs 2 (two) times daily as needed (for wheezing).    Yes [provider]  Cholecalciferol (VITAMIN D) 2000 UNITS tablet Take 2,000 Units by mouth 2 (two) times daily.    Yes [provider]  dipyridamole-aspirin (AGGRENOX) 200-25 MG 12hr capsule Take 1 capsule by mouth 2 (two) times daily. 11/11/16  Yes Dennie Bible, NP  rosuvastatin (CRESTOR) 20 MG tablet Take 20 mg by mouth daily. 04/21/17  Yes [provider]  sertraline (ZOLOFT) 50 MG tablet Take 50 mg by mouth every  morning.    Yes [provider]  TEGRETOL-XR 400 MG 12 hr tablet Take 1 tablet (400 mg total) by mouth 4 (four) times daily. 11/11/16  Yes Dennie Bible, NP  valsartan-hydrochlorothiazide (DIOVAN-HCT) 160-25 MG per tablet Take 0.5 tablets by mouth at bedtime.    Yes [provider]  pantoprazole (PROTONIX) 40 MG tablet Take 1 tablet (40 mg total) by mouth daily. Patient not taking: Reported on 05/23/2017 12/15/16   Domenic Polite, MD    Family History Family History  Problem Relation Age of Onset  . Heart disease Father        Heart Disease before age 3  . Heart attack Father   . Heart  disease Mother   . Hyperlipidemia Brother   . Hyperlipidemia Sister   . Heart attack Sister        in younger sister    Social History Social History   Tobacco Use  . Smoking status: Current Every Day Smoker    Packs/day: 1.50    Types: Cigarettes  . Smokeless tobacco: Current User    Types: Chew  . Tobacco comment: smoked 1 ppd for 30-40 years. (tobacco dependance)  Substance Use Topics  . 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 Drug use: No     Allergies   Patient has no known allergies.   Review of Systems Review of Systems  Constitutional: Positive for activity change.  Gastrointestinal: Negative for nausea.  Genitourinary: Positive for scrotal swelling and testicular pain.  Allergic/Immunologic: Negative for immunocompromised state.  All other systems reviewed and are negative.    Physical Exam Updated Vital Signs BP 106/64   Pulse 84   Temp 98.4 F (36.9 C)   Resp 17   Ht 6\' 2"  (1.88 m)   Wt 79.4 kg (175 lb)   SpO2 100%   BMI 22.47 kg/m   Physical Exam  Constitutional: He is oriented to person, place, and time. He appears well-developed.  HENT:  Head: Atraumatic.  Neck: Neck supple.  Cardiovascular: Normal rate.  Pulmonary/Chest: Effort normal.  Genitourinary:  Genitourinary Comments: L testicle is free moving, but it is indurated and tendern to palpation. No hernia appreciated/  Neurological: He is alert and oriented to person, place, and time.  Skin: Skin is warm.  Nursing note and vitals reviewed.    ED Treatments / Results  Labs (all labs ordered are listed, but only abnormal results are displayed) Labs Reviewed  BASIC METABOLIC PANEL - Abnormal; Notable for the following components:      Result Value   Glucose, Bld 113 (*)    BUN 35 (*)    Creatinine, Ser 1.43 (*)    Calcium 8.8 (*)    GFR calc non Af Amer 51 (*)    GFR calc Af Amer 59 (*)      All other components within normal limits  CBC WITH DIFFERENTIAL/PLATELET - Abnormal; Notable for the following components:   WBC 17.6 (*)    RBC 3.48 (*)    Hemoglobin 11.3 (*)    HCT 33.1 (*)    Neutro Abs 14.7 (*)    Monocytes Absolute 1.7 (*)    All other components within normal limits  URINE CULTURE  URINALYSIS, ROUTINE W REFLEX MICROSCOPIC    EKG  EKG Interpretation None       Radiology US Scrotum  Result Date:  05/23/2017 CLINICAL DATA:  Acute left testicular pain and swelling after injury last week. EXAM: SCROTAL ULTRASOUND DOPPLER ULTRASOUND OF THE TESTICLES TECHNIQUE: Complete ultrasound examination of the testicles, epididymis, and other scrotal structures was performed. Color and spectral Doppler ultrasound were also utilized to evaluate blood flow to the testicles. COMPARISON:  None. FINDINGS: Right testicle Measurements: 4.2 x 3.3 x 3.1 cm. No mass or microlithiasis visualized. Left testicle Measurements: 4.6 x 3.7 x 2.9 cm. No mass or microlithiasis visualized. Right epididymis:  Normal in size and appearance. Left epididymis: Enlarged with increased flow seen on Doppler suggesting epididymitis. Hydrocele:  None visualized. Varicocele:  None visualized. Pulsed Doppler interrogation of both testes demonstrates normal low resistance arterial and venous waveforms bilaterally. IMPRESSION: No evidence of testicular mass or torsion. Probable left epididymitis. Electronically Signed   By: Marijo Conception, M.D.   On: 05/23/2017 15:43   US Pelvic Doppler (torsion R/o Or Mass Arterial Flow)  Result Date: 05/23/2017 CLINICAL DATA:  Acute left testicular pain and swelling after injury last week. EXAM: SCROTAL ULTRASOUND DOPPLER ULTRASOUND OF THE TESTICLES TECHNIQUE: Complete ultrasound examination of the testicles, epididymis, and other scrotal structures was performed. Color and spectral Doppler ultrasound were also utilized to evaluate blood flow to the testicles. COMPARISON:   None. FINDINGS: Right testicle Measurements: 4.2 x 3.3 x 3.1 cm. No mass or microlithiasis visualized. Left testicle Measurements: 4.6 x 3.7 x 2.9 cm. No mass or microlithiasis visualized. Right epididymis:  Normal in size and appearance. Left epididymis: Enlarged with increased flow seen on Doppler suggesting epididymitis. Hydrocele:  None visualized. Varicocele:  None visualized. Pulsed Doppler interrogation of both testes demonstrates normal low resistance arterial and venous waveforms bilaterally. IMPRESSION: No evidence of testicular mass or torsion. Probable left epididymitis. Electronically Signed   By: Marijo Conception, M.D.   On: 05/23/2017 15:43    Procedures Procedures (including critical care time)  Medications Ordered in ED Medications  levofloxacin (LEVAQUIN) tablet 500 mg (not administered)  acetaminophen (TYLENOL) tablet 650 mg (not administered)     Initial Impression / Assessment and Plan / ED Course  I have reviewed the triage vital signs and the nursing notes.  Pertinent labs & imaging results that were available during my care of the patient were reviewed by me and considered in my medical decision making (see chart for details).  Clinical Course as of May 23 1648  Mon May 23, 2017  1647 Results from the ER workup discussed with the patient face to face and all questions answered to the best of my ability.   US PELVIC DOPPLER (TORSION R/O OR MASS ARTERIAL FLOW) [AN]  5643 Pt has AKI. Results from the ER workup discussed with the patient face to face and all questions answered to the best of my ability. Advised to drink fluids and have Urology recheck. No nsaids from the ER. Creatinine: (!) 1.43 [AN]  1649 Pt unable to urinate, as he just urinated few minutes ago. Will d/c w/o UA - not ideal, but pt wants to go home, and the initial tx wouldn't change.  [AN]    Clinical Course User Index [AN] Varney Biles, MD    Pt comes in with cc of testicle pain. US reveals  L sided epididymitis. Will start him on levaquin. Pt is also noted to be in mild AKI. Will have Urology recheck the Cr. Pt has no emesis. Will advice not taking any nsaids.  Results from the ER workup discussed with the patient face to  face and all questions answered to the best of my ability.   Final Clinical Impressions(s) / ED Diagnoses   Final diagnoses:  Pain in left testicle  Acute epididymo-orchitis  Sciatica of right side  AKI (acute kidney injury) [facility])    ED Discharge Orders    None       Varney Biles, MD 05/23/17 9675    Varney Biles, MD 05/23/17 [place], Elery Caden[place], MD 05/23/17 1650

## 2017-05-23 NOTE — ED Provider Notes (Signed)
Patient placed in Quick Look pathway, seen and evaluated   Chief Complaint: left testicle pain and back pain.  HPI:   2 days of constant L testicle pain, worse since yday. Also back pain that radiates to the L leg, with known hx of sciatica.  ROS: testicle pain (one)  Physical Exam:   Gen: No distress  Neuro: Awake and Alert  Skin: Warm    Focused Exam: indurated and tender L testicle.   Initiation of care has begun. The patient has been counseled on the process, plan, and necessity for staying for the completion/evaluation, and the remainder of the medical screening examination    Varney Biles, MD 05/23/17 1354

## 2017-05-23 NOTE — Discharge Instructions (Signed)
You were seen in the ER for the pain in the scrotum. You have an infection - please take the antibiotics as prescribed. Please follow up with Urology within 1 week. Also- please ensure the urologist checks your kidney function at the follow up. Drink plenty of fluid.  Please return to the ER if your symptoms worsen; you have increased pain, fevers, chills, inability to keep any medications down, confusion. Otherwise see the outpatient doctor as requested.

## 2017-05-23 NOTE — ED Notes (Signed)
Bed: WA32 Expected date:  Expected time:  Means of arrival:  Comments: 

## 2017-05-23 NOTE — ED Notes (Signed)
Pt had drawn for labs:  Gold Blue Lavender Lt green Dark green x2 

## 2017-05-30 DIAGNOSIS — N451 Epididymitis: Secondary | ICD-10-CM | POA: Diagnosis not present

## 2017-05-30 DIAGNOSIS — N201 Calculus of ureter: Secondary | ICD-10-CM | POA: Diagnosis not present

## 2017-05-30 DIAGNOSIS — M545 Low back pain: Secondary | ICD-10-CM | POA: Diagnosis not present

## 2017-05-30 DIAGNOSIS — R3912 Poor urinary stream: Secondary | ICD-10-CM | POA: Diagnosis not present

## 2017-05-30 DIAGNOSIS — N401 Enlarged prostate with lower urinary tract symptoms: Secondary | ICD-10-CM | POA: Diagnosis not present

## 2017-05-30 DIAGNOSIS — R3121 Asymptomatic microscopic hematuria: Secondary | ICD-10-CM | POA: Diagnosis not present

## 2017-05-30 DIAGNOSIS — R350 Frequency of micturition: Secondary | ICD-10-CM | POA: Diagnosis not present

## 2017-06-01 ENCOUNTER — Other Ambulatory Visit (HOSPITAL_COMMUNITY): Payer: Self-pay | Admitting: Urology

## 2017-06-01 DIAGNOSIS — N451 Epididymitis: Secondary | ICD-10-CM

## 2017-06-03 ENCOUNTER — Ambulatory Visit (HOSPITAL_COMMUNITY)
Admission: RE | Admit: 2017-06-03 | Discharge: 2017-06-03 | Disposition: A | Discharge status: Home / Self Care | Payer: 59 | Source: Ambulatory Visit | Attending: Urology | Admitting: Urology

## 2017-06-03 DIAGNOSIS — N451 Epididymitis: Secondary | ICD-10-CM | POA: Insufficient documentation

## 2017-06-03 DIAGNOSIS — N453 Epididymo-orchitis: Secondary | ICD-10-CM | POA: Diagnosis not present

## 2017-06-03 DIAGNOSIS — N433 Hydrocele, unspecified: Secondary | ICD-10-CM | POA: Diagnosis not present

## 2017-06-09 ENCOUNTER — Encounter: Payer: Self-pay | Admitting: *Deleted

## 2017-06-09 ENCOUNTER — Other Ambulatory Visit: Payer: Self-pay | Admitting: Urology

## 2017-06-09 ENCOUNTER — Encounter (HOSPITAL_BASED_OUTPATIENT_CLINIC_OR_DEPARTMENT_OTHER): Payer: Self-pay | Admitting: *Deleted

## 2017-06-09 ENCOUNTER — Other Ambulatory Visit: Payer: Self-pay

## 2017-06-09 ENCOUNTER — Telehealth: Payer: Self-pay | Admitting: Neurology

## 2017-06-09 NOTE — Progress Notes (Signed)
Spoken with Ezariah Npo after midnight, arrive 745 am [facility]  meds to take: sip of water, alprazolam prn, symbocort prn, tegretol xr Records on chart: ct [facility] 05-31-17 infrarenal aaa 4.6 x 4.5 cm patient referred to dr early to see in.  early April, cbc with dif chart/epic abnormal, chest xray 12-12-16 chart/epic, ekg 12-12-16 chart/epic. Loc carolyn [facility] np neurology 12-18-16 chart/epic. Called connie mabe [facility] to make aware patient on aggranox and has no instructions to stop it. Beola Cord to fool[place] up and let us know if surgery to be postponed.  Needs I stat 4 . Driver wife patricia

## 2017-06-09 NOTE — Progress Notes (Deleted)
    [name]  06/09/2017      Your procedure is scheduled on   Report to [place].  Call this number if you have problems the morning of surgery:[phone]             OUR ADDRESS IS [address] , WE ARE LOCATED IN [place].    Remember:  Do not eat food or drink liquids after midnight.  Take these medicines the morning of surgery with A SIP OF WATER  Do not wear jewelry, make-up or nail polish.  Do not wear lotions, powders, or perfumes, or deoderant.  Do not shave 48 hours prior to surgery.  Men may shave face and neck.  Do not bring valuables to the hospital.  [facility] is not responsible for any belongings or valuables.  Contacts, dentures or bridgework may not be worn into surgery.  Leave your suitcase in the car.  After surgery it may be brought to your room.  For patients admitted to the hospital, discharge time will be determined by your treatment team.   Special instructions:   Please read over the following fact sheets that you were given.

## 2017-06-09 NOTE — Telephone Encounter (Signed)
My review of electronic medical records reveals that the patient has not had a TIA or stroke for several years hence he may hold the Aggrenox for 3-5 days prior to scheduled cystoscopy and resume it after the procedure when safe with a small but acceptable. periprocedural risk of TIA or stroke if patient is willing

## 2017-06-09 NOTE — Telephone Encounter (Signed)
Fax confirmation received clearance, [facility]. [phone].

## 2017-06-09 NOTE — Telephone Encounter (Signed)
Connie/[facility] [phone] called pt is taking dipyridamole-aspirin (AGGRENOX) 200-25 MG 12hr capsule and is scheduled for cystoscopy bil retrograde pyelogram ureteroscopy and lazer lithotripsy with general anesthesia on 3/5. She said the appt will have to be moved since the pt is taking dipyridamole-aspirin (AGGRENOX) 200-25 MG 12hr capsule . She is requesting clearance for the surgery with a phone call and letter faxed to [phone]

## 2017-06-10 NOTE — Progress Notes (Signed)
Clearance note received dr Leonie Man [facility] and placed on patient chart, patient to stop aggrenox 3-5 days prior to 06-14-17 surgery.

## 2017-06-10 NOTE — Telephone Encounter (Signed)
LMVM for  [name] that did fax letter to them with fax confirmation.  She is to call me back if needed.

## 2017-06-14 NOTE — H&P (Signed)
Urology Preoperative H&P   Chief Complaint: Left flank pain  History of Present Illness: [name] is a 62 y.o. male recently evaluated on 05/23/17 at the [facility] ED with acute left-sided testicular pain and swelling. He had a scrotal ultrasound that demonstrated likely left epididymitis. He was started on Levaquin at that time. He is also noted to have a creatinine of 1.43.   From a voiding standpoint, he reports a progressively weakening force of stream and occasional urgency/frequency over the past one to 2 years. Nocturia x1-2. He denies any prior history of urinary tract infections, STIs, dysuria, hematuria or nephrolithiasis. He has a long history of chronic lower back pain for which he uses NSAIDs to treat. Over the last 2 months, he does note that the pain is somewhat worse on the left side, but is nonradiating and is not associated with fever/chills or nausea/vomiting.   He had a CT SS on 05/30/17 that demonstrated a 3 mm left UVJ stone and stranding along his left spermatic cord suggestive of epididymitis.      Past Medical History:  Diagnosis Date  . AAA (abdominal aortic aneurysm) (HCC)    4.6 cm x 4. 5 cm per 05-30-17 [facility] ct referred to dr early to see april 2019  . Anemia   . Artery stenosis (HCC)    L carotid  . Arthritis    BACK  . Back pain   . Complication of anesthesia    WOKE UP DURING COLONSCOPY  . COPD (chronic obstructive pulmonary disease) ([place])   . CVA (cerebral infarction) 05/27/09   TIA, posterior circulation 7/11  . Depression   . Glaucoma    LEFT EYE WORSE  . Groin swelling   . Hemorrhage 1996   intracerebral  . History of kidney stones   . HLD (hyperlipidemia)   . HTN (hypertension)   . ICAO (internal carotid artery occlusion)    right ICA stenosis, s/p R carotid endarterectomy on 06/13/09  . Meningitis    viral; as a child   . Monoclonal gammopathy of unknown significance 02/07/2013   No anemia,no elevation of Iggs,  no m-spike,  ?monoclonal protein on IFE 9/14  . PONV (postoperative nausea and vomiting)    NAUSEA  . Seizure disorder ([place])    at time of [place] in 1996.   . Seizures ([place])     5 years ago  . Stroke [facility]) March 2011    Past Surgical History:  Procedure Laterality Date  . CAROTID ENDARTERECTOMY Right 06/12/09   cea  . COLONOSCOPY  06/16/01   by Dr. Earle Gell. positive for colonic polyp, non-adenomatous   . COLONOSCOPY WITH PROPOFOL N/A 02/03/2015   Procedure: COLONOSCOPY WITH PROPOFOL;  Surgeon: Garlan Fair, MD;  Location: [facility];  Service: Endoscopy;  Laterality: N/A;  . GLAUCOMA REPAIR  2016   by taking out cataracts (bilateral)  . R CEA  3/11  . R inguinal hernia repair  07/05/09    Allergies:  Allergies  Allergen Reactions  . Tape Rash    ADHESIVE    Family History  Problem Relation Age of Onset  . Heart disease Father        Heart Disease before age 55  . Heart attack Father   . Heart disease Mother   . Hyperlipidemia Brother   . Hyperlipidemia Sister   . Heart attack Sister        in younger sister    Social History:  reports that he  has been smoking cigarettes.  He has been smoking about 1.50 packs per day. His smokeless tobacco use includes chew. He reports that he drinks alcohol. He reports that he uses drugs. Drug: Marijuana.  ROS: A complete review of systems was performed.  All systems are negative except for pertinent findings as noted.  Physical Exam:  Vital signs in last 24 hours:   Constitutional:  Alert and oriented, No acute distress Cardiovascular: Regular rate and rhythm, No JVD Respiratory: Normal respiratory effort, Lungs clear bilaterally GI: Abdomen is soft, nontender, nondistended, no abdominal masses GU: No CVA tenderness Lymphatic: No lymphadenopathy Neurologic: Grossly intact, no focal deficits Psychiatric: Normal mood and affect  Laboratory Data:  No results for input(s): WBC, HGB, HCT, PLT in the last 72 hours.  No results for  input(s): NA, K, CL, GLUCOSE, BUN, CALCIUM, CREATININE in the last 72 hours.  Invalid input(s): CO3   No results found for this or any previous visit (from the past 24 hour(s)). No results found for this or any previous visit (from the past 240 hour(s)).  Renal Function: No results for input(s): CREATININE in the last 168 hours. CrCl cannot be calculated (Patient's most recent lab result is older than the maximum 21 days allowed.).  Radiologic Imaging: No results found.  I independently reviewed the above imaging studies.  Assessment and Plan [name] is a 62 y.o. male with a 3 mm left UVJ stone and left epididymitis   -The risk, benefits and alternatives of cystoscopy with left ureteroscopy, holmium laser lithotripsy and left JJ stent placement and possible scrotal debridement was discussed with the patient.  Risks include, bleeding, infection, inability to retrieve his stone, ureteral injury, the need for indwelling ureteral stent, MI, stroke, PE/DVT and the inherent risk with general anesthesia.  He voices understanding and wishes to proceed.    Ellison Hughs, MD 06/14/2017, 8:42 AM  [facility] Pager: [phone]

## 2017-06-14 NOTE — Anesthesia Preprocedure Evaluation (Addendum)
Anesthesia Evaluation  Patient identified by MRN, date of birth, ID band Patient awake    Reviewed: Allergy & Precautions, NPO status , Patient's Chart, lab work & pertinent test results  History of Anesthesia Complications (+) PONV  Airway Mallampati: II  TM Distance: >3 FB     Dental  (+) Teeth Intact, Dental Advisory Given, Caps,    Pulmonary COPD, Current Smoker,    breath sounds clear to auscultation       Cardiovascular hypertension, Pt. on medications + Peripheral Vascular Disease   Rhythm:Regular Rate:Normal  12-15-16 2 D ECHO Impressions: - Normal LV systolic function; moderate diastolic dysfunction;   sclerotic aortic valve with trace AI; mild MR; mild LAE.   Neuro/Psych Seizures -, Well Controlled,  Depression TIA   GI/Hepatic negative GI ROS, Neg liver ROS,   Endo/Other    Renal/GU Renal diseaseRenal stone Left scrotal abscess     Musculoskeletal   Abdominal   Peds  Hematology  (+) anemia ,   Anesthesia Other Findings   Reproductive/Obstetrics                        Anesthesia Physical Anesthesia Plan  ASA: III  Anesthesia Plan: General   Post-op Pain Management:    Induction: Intravenous  PONV Risk Score and Plan: 2 and Treatment may vary due to age or medical condition, Ondansetron, Dexamethasone and Midazolam  Airway Management Planned: LMA  Additional Equipment:   Intra-op Plan:   Post-operative Plan: Extubation in OR  Informed Consent: I have reviewed the patients History and Physical, chart, labs and discussed the procedure including the risks, benefits and alternatives for the proposed anesthesia with the patient or authorized representative who has indicated his/her understanding and acceptance.   Dental advisory given  Plan Discussed with: CRNA and Anesthesiologist  Anesthesia Plan Comments:        Anesthesia Quick Evaluation

## 2017-06-15 ENCOUNTER — Ambulatory Visit (HOSPITAL_BASED_OUTPATIENT_CLINIC_OR_DEPARTMENT_OTHER)
Admission: RE | Admit: 2017-06-15 | Discharge: 2017-06-15 | Disposition: A | Discharge status: Home / Self Care | Payer: 59 | Source: Ambulatory Visit | Attending: Urology | Admitting: Urology

## 2017-06-15 ENCOUNTER — Encounter (HOSPITAL_BASED_OUTPATIENT_CLINIC_OR_DEPARTMENT_OTHER): Payer: Self-pay

## 2017-06-15 ENCOUNTER — Ambulatory Visit (HOSPITAL_BASED_OUTPATIENT_CLINIC_OR_DEPARTMENT_OTHER): Payer: 59 | Admitting: Anesthesiology

## 2017-06-15 ENCOUNTER — Encounter (HOSPITAL_BASED_OUTPATIENT_CLINIC_OR_DEPARTMENT_OTHER)
Admission: RE | Disposition: A | Discharge status: Home / Self Care | Payer: Self-pay | Source: Ambulatory Visit | Attending: Urology

## 2017-06-15 DIAGNOSIS — Z8673 Personal history of transient ischemic attack (TIA), and cerebral infarction without residual deficits: Secondary | ICD-10-CM | POA: Diagnosis not present

## 2017-06-15 DIAGNOSIS — I714 Abdominal aortic aneurysm, without rupture: Secondary | ICD-10-CM | POA: Insufficient documentation

## 2017-06-15 DIAGNOSIS — N35919 Unspecified urethral stricture, male, unspecified site: Secondary | ICD-10-CM | POA: Insufficient documentation

## 2017-06-15 DIAGNOSIS — Z87442 Personal history of urinary calculi: Secondary | ICD-10-CM | POA: Insufficient documentation

## 2017-06-15 DIAGNOSIS — N201 Calculus of ureter: Secondary | ICD-10-CM | POA: Insufficient documentation

## 2017-06-15 DIAGNOSIS — Z8661 Personal history of infections of the central nervous system: Secondary | ICD-10-CM | POA: Insufficient documentation

## 2017-06-15 DIAGNOSIS — F329 Major depressive disorder, single episode, unspecified: Secondary | ICD-10-CM | POA: Diagnosis not present

## 2017-06-15 DIAGNOSIS — G40909 Epilepsy, unspecified, not intractable, without status epilepticus: Secondary | ICD-10-CM | POA: Diagnosis not present

## 2017-06-15 DIAGNOSIS — Z711 Person with feared health complaint in whom no diagnosis is made: Secondary | ICD-10-CM | POA: Diagnosis not present

## 2017-06-15 DIAGNOSIS — E785 Hyperlipidemia, unspecified: Secondary | ICD-10-CM | POA: Insufficient documentation

## 2017-06-15 DIAGNOSIS — N453 Epididymo-orchitis: Secondary | ICD-10-CM | POA: Diagnosis not present

## 2017-06-15 DIAGNOSIS — F17210 Nicotine dependence, cigarettes, uncomplicated: Secondary | ICD-10-CM | POA: Insufficient documentation

## 2017-06-15 DIAGNOSIS — Z79899 Other long term (current) drug therapy: Secondary | ICD-10-CM | POA: Diagnosis not present

## 2017-06-15 DIAGNOSIS — N35013 Post-traumatic anterior urethral stricture: Secondary | ICD-10-CM | POA: Diagnosis not present

## 2017-06-15 DIAGNOSIS — J449 Chronic obstructive pulmonary disease, unspecified: Secondary | ICD-10-CM | POA: Insufficient documentation

## 2017-06-15 DIAGNOSIS — Z888 Allergy status to other drugs, medicaments and biological substances status: Secondary | ICD-10-CM | POA: Diagnosis not present

## 2017-06-15 DIAGNOSIS — I10 Essential (primary) hypertension: Secondary | ICD-10-CM | POA: Diagnosis not present

## 2017-06-15 DIAGNOSIS — N35819 Other urethral stricture, male, unspecified site: Secondary | ICD-10-CM | POA: Diagnosis not present

## 2017-06-15 HISTORY — DX: Anemia, unspecified: D64.9

## 2017-06-15 HISTORY — DX: Unspecified osteoarthritis, unspecified site: M19.90

## 2017-06-15 HISTORY — DX: Personal history of urinary calculi: Z87.442

## 2017-06-15 HISTORY — DX: Major depressive disorder, single episode, unspecified: F32.9

## 2017-06-15 HISTORY — DX: Adverse effect of unspecified anesthetic, initial encounter: T41.45XA

## 2017-06-15 HISTORY — DX: Other complications of anesthesia, initial encounter: T88.59XA

## 2017-06-15 HISTORY — PX: CYSTOSCOPY/URETEROSCOPY/HOLMIUM LASER/STENT PLACEMENT: SHX6546

## 2017-06-15 HISTORY — DX: Other specified postprocedural states: R11.2

## 2017-06-15 HISTORY — DX: Depression, unspecified: F32.A

## 2017-06-15 HISTORY — DX: Abdominal aortic aneurysm, without rupture, unspecified: I71.40

## 2017-06-15 HISTORY — DX: Other specified postprocedural states: Z98.890

## 2017-06-15 HISTORY — DX: Dorsalgia, unspecified: M54.9

## 2017-06-15 HISTORY — DX: Abdominal aortic aneurysm, without rupture: I71.4

## 2017-06-15 HISTORY — DX: Other intra-abdominal and pelvic swelling, mass and lump: R19.09

## 2017-06-15 HISTORY — DX: Chronic obstructive pulmonary disease, unspecified: J44.9

## 2017-06-15 LAB — POCT I-STAT 4, (NA,K, GLUC, HGB,HCT)
GLUCOSE: 90 mg/dL (ref 65–99)
HEMATOCRIT: 32 % — AB (ref 39.0–52.0)
Hemoglobin: 10.9 g/dL — ABNORMAL LOW (ref 13.0–17.0)
Potassium: 4.1 mmol/L (ref 3.5–5.1)
SODIUM: 143 mmol/L (ref 135–145)

## 2017-06-15 SURGERY — CYSTOSCOPY/URETEROSCOPY/HOLMIUM LASER/STENT PLACEMENT
Anesthesia: General | Laterality: Left

## 2017-06-15 MED ORDER — PROPOFOL 10 MG/ML IV BOLUS
INTRAVENOUS | Status: AC
  Filled 2017-06-15: qty 40

## 2017-06-15 MED ORDER — IOHEXOL 300 MG/ML  SOLN
INTRAMUSCULAR | Status: DC | PRN
Start: 2017-06-15 — End: 2017-06-15
  Administered 2017-06-15: 8 mL via URETHRAL

## 2017-06-15 MED ORDER — CEFAZOLIN SODIUM-DEXTROSE 2-4 GM/100ML-% IV SOLN
INTRAVENOUS | Status: AC
  Filled 2017-06-15: qty 100

## 2017-06-15 MED ORDER — PHENAZOPYRIDINE HCL 200 MG PO TABS
200.0000 mg | ORAL_TABLET | Freq: Once | ORAL | Status: AC
  Administered 2017-06-15: 200 mg via ORAL
  Filled 2017-06-15: qty 1

## 2017-06-15 MED ORDER — ALFUZOSIN HCL ER 10 MG PO TB24: 10.0000 mg | ORAL_TABLET | Freq: Every day | ORAL | 11 refills | Status: DC

## 2017-06-15 MED ORDER — DEXAMETHASONE SODIUM PHOSPHATE 10 MG/ML IJ SOLN
INTRAMUSCULAR | Status: DC | PRN
  Administered 2017-06-15: 10 mg via INTRAVENOUS

## 2017-06-15 MED ORDER — OXYBUTYNIN CHLORIDE 5 MG PO TABS
10.0000 mg | ORAL_TABLET | Freq: Once | ORAL | Status: DC
Start: 2017-06-15 — End: 2017-06-15
  Filled 2017-06-15: qty 2

## 2017-06-15 MED ORDER — EPHEDRINE 5 MG/ML INJ
INTRAVENOUS | Status: AC
  Filled 2017-06-15: qty 10

## 2017-06-15 MED ORDER — FENTANYL CITRATE (PF) 100 MCG/2ML IJ SOLN
INTRAMUSCULAR | Status: DC | PRN
  Administered 2017-06-15: 100 ug via INTRAVENOUS

## 2017-06-15 MED ORDER — ONDANSETRON HCL 4 MG/2ML IJ SOLN
INTRAMUSCULAR | Status: DC | PRN
  Administered 2017-06-15: 4 mg via INTRAVENOUS

## 2017-06-15 MED ORDER — FENTANYL CITRATE (PF) 100 MCG/2ML IJ SOLN
INTRAMUSCULAR | Status: AC
  Filled 2017-06-15: qty 2

## 2017-06-15 MED ORDER — LACTATED RINGERS IV SOLN
INTRAVENOUS | Status: DC
  Administered 2017-06-15: 09:00:00 via INTRAVENOUS
  Filled 2017-06-15: qty 1000

## 2017-06-15 MED ORDER — LIDOCAINE 2% (20 MG/ML) 5 ML SYRINGE
INTRAMUSCULAR | Status: AC
  Filled 2017-06-15: qty 5

## 2017-06-15 MED ORDER — FENTANYL CITRATE (PF) 100 MCG/2ML IJ SOLN
25.0000 ug | INTRAMUSCULAR | Status: DC | PRN
  Administered 2017-06-15: 50 ug via INTRAVENOUS
  Filled 2017-06-15: qty 1

## 2017-06-15 MED ORDER — OXYBUTYNIN CHLORIDE ER 10 MG PO TB24
ORAL_TABLET | ORAL | Status: AC
  Filled 2017-06-15: qty 1

## 2017-06-15 MED ORDER — HYDROCODONE-ACETAMINOPHEN 5-325 MG PO TABS: 1.0000 | ORAL_TABLET | ORAL | 0 refills | Status: DC | PRN

## 2017-06-15 MED ORDER — LIDOCAINE 2% (20 MG/ML) 5 ML SYRINGE
INTRAMUSCULAR | Status: DC | PRN
  Administered 2017-06-15: 60 mg via INTRAVENOUS

## 2017-06-15 MED ORDER — ONDANSETRON HCL 4 MG/2ML IJ SOLN
INTRAMUSCULAR | Status: AC
  Filled 2017-06-15: qty 2

## 2017-06-15 MED ORDER — OXYBUTYNIN CHLORIDE ER 10 MG PO TB24
10.0000 mg | ORAL_TABLET | Freq: Once | ORAL | Status: AC
  Administered 2017-06-15: 10 mg via ORAL
  Filled 2017-06-15: qty 1

## 2017-06-15 MED ORDER — MIDAZOLAM HCL 2 MG/2ML IJ SOLN
INTRAMUSCULAR | Status: AC
  Filled 2017-06-15: qty 2

## 2017-06-15 MED ORDER — EPHEDRINE SULFATE-NACL 50-0.9 MG/10ML-% IV SOSY
PREFILLED_SYRINGE | INTRAVENOUS | Status: DC | PRN
  Administered 2017-06-15: 10 mg via INTRAVENOUS
  Administered 2017-06-15: 5 mg via INTRAVENOUS

## 2017-06-15 MED ORDER — SODIUM CHLORIDE 0.9 % IR SOLN
Status: DC | PRN
  Administered 2017-06-15: 3000 mL via INTRAVESICAL

## 2017-06-15 MED ORDER — OXYBUTYNIN CHLORIDE ER 10 MG PO TB24: 10.0000 mg | ORAL_TABLET | Freq: Every day | ORAL | 1 refills | Status: DC

## 2017-06-15 MED ORDER — DEXAMETHASONE SODIUM PHOSPHATE 10 MG/ML IJ SOLN
INTRAMUSCULAR | Status: AC
  Filled 2017-06-15: qty 1

## 2017-06-15 MED ORDER — MIDAZOLAM HCL 2 MG/2ML IJ SOLN
INTRAMUSCULAR | Status: DC | PRN
  Administered 2017-06-15: 2 mg via INTRAVENOUS

## 2017-06-15 MED ORDER — CEFAZOLIN SODIUM-DEXTROSE 2-4 GM/100ML-% IV SOLN
2.0000 g | Freq: Once | INTRAVENOUS | Status: AC
  Administered 2017-06-15: 2 g via INTRAVENOUS
  Filled 2017-06-15: qty 100

## 2017-06-15 MED ORDER — ONDANSETRON HCL 4 MG PO TABS: 4.0000 mg | ORAL_TABLET | Freq: Every day | ORAL | 1 refills | Status: DC | PRN

## 2017-06-15 MED ORDER — PHENAZOPYRIDINE HCL 200 MG PO TABS
200.0000 mg | ORAL_TABLET | Freq: Three times a day (TID) | ORAL | 0 refills | Status: DC | PRN
Start: 2017-06-15 — End: 2017-07-12

## 2017-06-15 MED ORDER — PHENAZOPYRIDINE HCL 100 MG PO TABS
ORAL_TABLET | ORAL | Status: AC
  Filled 2017-06-15: qty 2

## 2017-06-15 MED ORDER — PROPOFOL 10 MG/ML IV BOLUS
INTRAVENOUS | Status: DC | PRN
  Administered 2017-06-15: 50 mg via INTRAVENOUS
  Administered 2017-06-15: 200 mg via INTRAVENOUS

## 2017-06-15 MED ORDER — DEXAMETHASONE SODIUM PHOSPHATE 10 MG/ML IJ SOLN
INTRAMUSCULAR | Status: AC
Start: 2017-06-15 — End: ?
  Filled 2017-06-15: qty 1

## 2017-06-15 SURGICAL SUPPLY — 31 items
APL SKNCLS STERI-STRIP NONHPOA (GAUZE/BANDAGES/DRESSINGS)
BAG DRAIN URO-CYSTO SKYTR STRL (DRAIN) ×2 IMPLANT
BAG DRN UROCATH (DRAIN) ×1
BASKET STONE 1.7 NGAGE (UROLOGICAL SUPPLIES) IMPLANT
BASKET ZERO TIP NITINOL 2.4FR (BASKET) ×1 IMPLANT
BENZOIN TINCTURE PRP APPL 2/3 (GAUZE/BANDAGES/DRESSINGS) IMPLANT
BSKT STON RTRVL ZERO TP 2.4FR (BASKET)
CATH FOLEY 2W COUNCIL 5CC 18FR (CATHETERS) ×1 IMPLANT
CATH URET 5FR 28IN OPEN ENDED (CATHETERS) IMPLANT
CATH URET DUAL LUMEN 6-10FR 50 (CATHETERS) ×1 IMPLANT
CLOTH BEACON ORANGE TIMEOUT ST (SAFETY) ×2 IMPLANT
CONT SPEC 4OZ CLIKSEAL STRL BL (MISCELLANEOUS) ×1 IMPLANT
FIBER LASER FLEXIVA 365 (UROLOGICAL SUPPLIES) IMPLANT
FIBER LASER TRAC TIP (UROLOGICAL SUPPLIES) IMPLANT
GLOVE BIO SURGEON STRL SZ7.5 (GLOVE) ×2 IMPLANT
GOWN STRL REUS W/TWL XL LVL3 (GOWN DISPOSABLE) IMPLANT
GUIDEWIRE ANG ZIPWIRE 038X150 (WIRE) ×2 IMPLANT
GUIDEWIRE STR DUAL SENSOR (WIRE) IMPLANT
INFUSOR MANOMETER BAG 3000ML (MISCELLANEOUS) ×2 IMPLANT
IV NS 1000ML (IV SOLUTION)
IV NS 1000ML BAXH (IV SOLUTION) IMPLANT
IV NS IRRIG 3000ML ARTHROMATIC (IV SOLUTION) ×2 IMPLANT
KIT TURNOVER CYSTO (KITS) ×2 IMPLANT
MANIFOLD NEPTUNE II (INSTRUMENTS) ×2 IMPLANT
NS IRRIG 500ML POUR BTL (IV SOLUTION) ×4 IMPLANT
PACK CYSTO (CUSTOM PROCEDURE TRAY) ×2 IMPLANT
SPONGE LAP 18X18 X RAY DECT (DISPOSABLE) ×1 IMPLANT
STENT URET 6FRX26 CONTOUR (STENTS) ×1 IMPLANT
STRIP CLOSURE SKIN 1/2X4 (GAUZE/BANDAGES/DRESSINGS) IMPLANT
SYRINGE 10CC LL (SYRINGE) ×2 IMPLANT
TUBE CONNECTING 12X1/4 (SUCTIONS) IMPLANT

## 2017-06-15 NOTE — Discharge Instructions (Signed)
Indwelling Urinary Catheter Care, Adult Take good care of your catheter to keep it working and to prevent problems. How to wear your catheter Attach your catheter to your leg with tap a leg strap. Make sure it is not too tight.  How to wear a drainage bag You should have:  A large overnight bag.  A small leg bag.  Overnight Bag You may wear the overnight bag at any time. Always keep the bag below the level of your bladder but off the floor. Leg Bag Never wear the leg bag at night. Always wear the leg bag below your knee. Keep the leg bag secure with a leg strap  How to care for your skin  Clean the skin around the catheter at least once every day.  Shower every day. Do not take baths.  Put creams, lotions, or ointments on your genital area only as told by your doctor.  Do not use powders, sprays, or lotions on your genital area. How to clean your catheter and your skin 1. Wash your hands with soap and water. 2. Wet a washcloth in warm water and gentle (mild) soap. 3. Use the washcloth to clean the skin where the catheter enters your body. Clean downward and wipe away from the catheter in small circles. Do not wipe toward the catheter. 4. Pat the area dry with a clean towel. Make sure to clean off all soap. How to care for your drainage bags Empty your drainage bag when it is ?- full or at least 2-3 times a day. Replace your drainage bag once a month or sooner if it starts to smell bad or look dirty. Do not clean your drainage bag unless told by your doctor. Emptying a drainage bag  Supplies Needed  Rubbing alcohol.  Gauze pad or cotton ball.  Tape or a leg strap.  Steps 1. Wash your hands with soap and water. 2. Separate (detach) the bag from your leg. 3. Hold the bag over the toilet or a clean container. Keep the bag below your hips and bladder. This stops pee (urine) from going back into the tube. 4. Open the pour spout at the bottom of the bag. 5. Empty the pee into  the toilet or container. Do not let the pour spout touch any surface. 6. Put rubbing alcohol on a gauze pad or cotton ball. 7. Use the gauze pad or cotton ball to clean the pour spout. 8. Close the pour spout. 9. Attach the bag to your leg with tape or a leg strap. 10. Wash your hands.  Changing a drainage bag Supplies Needed  Alcohol wipes.  A clean drainage bag.  Adhesive tape or a leg strap.  Steps 1. Wash your hands with soap and water. 2. Separate the dirty bag from your leg. 3. Pinch the rubber catheter with your fingers so that pee does not spill out. 4. Separate the catheter tube from the drainage tube where these tubes connect (at the connection valve). Do not let the tubes touch any surface. 5. Clean the end of the catheter tube with an alcohol wipe. Use a different alcohol wipe to clean the end of the drainage tube. 6. Connect the catheter tube to the drainage tube of the clean bag. 7. Attach the new bag to the leg with adhesive tape or a leg strap. 8. Wash your hands.  How to prevent infection and other problems  Never pull on your catheter or try to remove it. Pulling can damage tissue  in your body.  Always wash your hands before and after touching your catheter.  If a leg strap gets wet, replace it with a dry one.  Drink enough fluids to keep your pee clear or pale yellow, or as told by your doctor.  Do not let the drainage bag or tubing touch the floor.  Wear cotton underwear.  If you are male, wipe from front to back after you poop (have a bowel movement).  Check on the catheter often to make sure it works and the tubing is not twisted. Get help if:  Your pee is cloudy.  Your pee smells unusually bad.  Your pee is not draining into the bag.  Your tube gets clogged.  Your catheter starts to leak.  Your bladder feels full. Get help right away if:  You have redness, swelling, or pain where the catheter enters your body.  You have fluid, pus,  or a bad smell coming from the area where the catheter enters your body.  The area where the catheter enters your body feels warm.  You have a fever.  You have pain in your: ? Stomach (abdomen). ? Legs. ? Lower back. ? Bladder.  You see blood fill the catheter.  Your pee is pink or red.  You feel sick to your stomach (nauseous).  You throw up (vomit).  You have chills.  Your catheter gets pulled out. This information is not intended to replace advice given to you by your health care provider. Make sure you discuss any questions you have with your health care provider. Document Released: 07/24/2012 Document Revised: 02/25/2016 Document Reviewed: 09/11/2013 Elsevier Interactive Patient Education  2018 [employer] [facility] Care Instructions  Activity: Get plenty of rest for the remainder of the day. A responsible individual must stay with you for 24 hours following the procedure.  For the next 24 hours, DO NOT: -Drive a car -Paediatric nurse -Drink alcoholic beverages -Take any medication unless instructed by your physician -Make any legal decisions or sign important papers.  Meals: Start with liquid foods such as gelatin or soup. Progress to regular foods as tolerated. Avoid greasy, spicy, heavy foods. If nausea and/or vomiting occur, drink only clear liquids until the nausea and/or vomiting subsides. Call your physician if vomiting continues.  Special Instructions/Symptoms: Your throat may feel dry or sore from the anesthesia or the breathing tube placed in your throat during surgery. If this causes discomfort, gargle with warm salt water. The discomfort should disappear within 24 hours.  If you had a scopolamine patch placed behind your ear for the management of post- operative nausea and/or vomiting:  1. The medication in the patch is effective for 72 hours, after which it should be removed.  Wrap patch in a tissue and discard in the trash. Wash hands  thoroughly with soap and water. 2. You may remove the patch earlier than 72 hours if you experience unpleasant side effects which may include dry mouth, dizziness or visual disturbances. 3. Avoid touching the patch. Wash your hands with soap and water after contact with the patch.

## 2017-06-15 NOTE — Op Note (Signed)
Operative Note  Preoperative diagnosis:  1.  3 mm left distal ureteral stone 2.  History of left epididymoorchitis  Postoperative diagnosis: 1.  Anterior urethral stricture measuring approximately 5 mm 2.  Passed left ureteral stone 3.  Left distal ureteral narrowing 4.  History of left epididymal orchitis  Procedure(s): 1.  Cystoscopy 2.  Urethral dilation with Leander Rams sounds 3.  Left retrograde pyelogram with intraoperative interpretation of fluoroscopic imaging 4.  Left ureteroscopy 5.  Left JJ stent placement with tether  Surgeon: Ellison Hughs, MD  Assistants: None  Anesthesia: General LMA  Complications: None  EBL: Less than 5 mL  Specimens: 1.  None  Drains/Catheters: 1.  Left 6 French JJ stent with tether secured to 18 French Foley catheter  Intraoperative findings:  1.  Anterior urethral stricture measuring approximately 5 mm in the pendulous urethra that could not accommodate the 23 French cystoscope sheath  2.  Left distal ureteral narrowing that required dilation with a 10 French dual-lumen catheter 3.  Solitary left collecting system with no filling defects or dilation involving the left ureter or left renal pelvis seen on retrograde pyelogram   Indication:  [name] is a 62 y.o. male recently evaluated on 05/23/17 at the [facility] ED with acute left-sided testicular pain and swelling. He had a scrotal ultrasound that demonstrated likely left epididymitis. He was started on Levaquin at that time. He is also noted to have a creatinine of 1.43.   From a voiding standpoint, he reports a progressively weakening force of stream and occasional urgency/frequency over the past one to 2 years. Nocturia x1-2. He denies any prior history of urinary tract infections, STIs, dysuria, hematuria or nephrolithiasis. He has a long history of chronic lower back pain for which he uses NSAIDs to treat. Over the last 2 months, he does note that the pain is somewhat worse on  the left side, but is nonradiating and is not associated with fever/chills or nausea/vomiting.   He had a CT SS on 05/30/17 that demonstrated a 3 mm left UVJ stone and stranding along his left spermatic cord suggestive of epididymitis.     Description of procedure: After informed consent was obtained, the patient was brought to the operating room and general LMA anesthesia was administered. The patient was then placed in the dorsolithotomy position and prepped and draped in usual sterile fashion. A timeout was performed. A 23 French rigid cystoscope was then inserted into the urethral meatus and advanced until a 5 mm urethral stricture within the anterior urethra was identified.  Leander Rams sounds were then used to sequentially dilate the stricture, starting at 37 [place] and progressing up to 24 [place] (and 2 [place] increments).  The 23 French cystoscope sheath was then exchanged for a 59 French sheath, which was able to easily bypass the area of stricture.  There were no other strictures identified within the posterior or membranous urethra is.  A complete bladder survey revealed no intravesical pathology.    A 5 French open-ended catheter was then inserted into the left ureteral orifice and a left retrograde pyelogram was obtained, with the findings listed above.  A Glidewire was then advanced through the lumen of the ureteral catheter and up to the left renal pelvis, under fluoroscopic guidance.  The rigid cystoscope was then exchanged for a semirigid ureteroscope.  The ureteroscope was inserted into the left ureteral orifice and was only able to advance approximately 3 cm due to stenosis of the left distal ureter.  A dual-lumen catheter was then used to dilate the distal left ureter, under fluoroscopic guidance.  After dilation, the semirigid ureteroscope was able to easily bypass the area of stenosis.  A full inspection of the entire length of the left ureter revealed no evidence of a ureteral stone.   The semirigid ureteroscope was then exchanged for a flexible ureteroscope, which was advanced over the wire and up to the left renal pelvis, under fluoroscopic guidance.  Inspection of all left renal calyces in the left renal pelvis revealed no evidence of nephrolithiasis.  The Glidewire was then replaced through the sheath of the flexible scope and left in place within the left collecting system.  The flexible ureteroscope was then removed under direct vision.  A 6 [place] JJ stent was then placed over the wire and into good position within the left collecting system, confirming placement via fluoroscopy.  A Glidewire was then left within the lumen of the bladder through the cystoscope and an 18 [place] tip catheter was advanced over the wire and into the bladder.  10 mL of sterile water was left in the catheter balloon.  The tether of the stent was then secured to the catheter, which is then placed to gravity drainage.  The patient tolerated the procedure well and was transferred to the postanesthesia unit in stable condition.  Plan: The patient has been instructed to remove his Foley catheter and his left JJ stent, which is secured to the catheter via the tether, on 06/23/2017 at 6 AM.

## 2017-06-15 NOTE — Anesthesia Procedure Notes (Signed)
Procedure Name: LMA Insertion Date/Time: 06/15/2017 9:59 AM Performed by: Wanita Chamberlain, CRNA Pre-anesthesia Checklist: Patient identified, Timeout performed, Emergency Drugs available, Suction available and Patient being monitored Patient Re-evaluated:Patient Re-evaluated prior to induction Oxygen Delivery Method: Circle system utilized Preoxygenation: Pre-oxygenation with 100% oxygen Induction Type: IV induction Ventilation: Mask ventilation without difficulty LMA: LMA inserted LMA Size: 4.0

## 2017-06-15 NOTE — Interval H&P Note (Signed)
History and Physical Interval Note:  06/15/2017 9:48 AM  [name]  has presented today for surgery, with the diagnosis of LEFT URETERAL STONE, LEFT TESTICULAR ABSCESS  The various methods of treatment have been discussed with the patient and family. After consideration of risks, benefits and other options for treatment, the patient has consented to  Procedure(s): CYSTOSCOPY/RETROGRADE/URETEROSCOPY/HOLMIUM LASER/STENT PLACEMENT (Left) POSSIBLE IRRIGATION AND DEBRIDEMENTOF SCROTAL ABSCESS (Left) as a surgical intervention .  The patient's history has been reviewed, patient examined, no change in status, stable for surgery.  I have reviewed the patient's chart and labs.  Questions were answered to the patient's satisfaction.     [name]

## 2017-06-15 NOTE — Anesthesia Postprocedure Evaluation (Signed)
Anesthesia Post Note  Patient: [name]  Procedure(s) Performed: CYSTOSCOPY/RETROGRADE/URETEROSCOPY/ /STENT PLACEMENT (Left )     Patient location during evaluation: PACU Anesthesia Type: General Level of consciousness: awake Pain management: pain level controlled Vital Signs Assessment: post-procedure vital signs reviewed and stable Respiratory status: spontaneous breathing Cardiovascular status: stable Anesthetic complications: no    Last Vitals:  Vitals:   06/15/17 1100 06/15/17 1115  BP: 108/67 126/68  Pulse: 66 70  Resp: 11 14  Temp:    SpO2: 99% 95%    Last Pain:  Vitals:   06/15/17 0810  TempSrc: Oral                 Malyssa Maris

## 2017-06-15 NOTE — Transfer of Care (Signed)
Immediate Anesthesia Transfer of Care Note  Patient: [name]  Procedure(s) Performed: CYSTOSCOPY/RETROGRADE/URETEROSCOPY/ /STENT PLACEMENT (Left )  Patient Location: PACU  Anesthesia Type:General  Level of Consciousness: awake, alert , oriented and patient cooperative  Airway & Oxygen Therapy: Patient Spontanous Breathing and Patient connected to nasal cannula oxygen  Post-op Assessment: Report given to RN and Post -op Vital signs reviewed and stable  Post vital signs: Reviewed and stable  Last Vitals:  Vitals:   06/15/17 0810  BP: 111/70  Pulse: 80  Resp: 16  Temp: 36.7 C  SpO2: 100%    Last Pain:  Vitals:   06/15/17 0810  TempSrc: Oral      Patients Stated Pain Goal: 6 (29/56/21 3086)  Complications: No apparent anesthesia complications

## 2017-06-16 ENCOUNTER — Encounter (HOSPITAL_BASED_OUTPATIENT_CLINIC_OR_DEPARTMENT_OTHER): Payer: Self-pay | Admitting: Urology

## 2017-07-12 ENCOUNTER — Encounter: Payer: Self-pay | Admitting: Vascular Surgery

## 2017-07-12 ENCOUNTER — Other Ambulatory Visit: Payer: Self-pay

## 2017-07-12 ENCOUNTER — Ambulatory Visit: Payer: 59 | Admitting: Vascular Surgery

## 2017-07-12 VITALS — BP 126/75 | HR 81 | Temp 98.0°F | Resp 16 | Ht 74.0 in | Wt 180.6 lb

## 2017-07-12 DIAGNOSIS — I714 Abdominal aortic aneurysm, without rupture, unspecified: Secondary | ICD-10-CM

## 2017-07-12 DIAGNOSIS — R0989 Other specified symptoms and signs involving the circulatory and respiratory systems: Secondary | ICD-10-CM

## 2017-07-12 NOTE — Progress Notes (Signed)
[facility]ist of [place]  Patient name: [name] MRN: [id number] DOB: 1955/07/06 Sex: male  REASON FOR CONSULT: Discuss recent incidental finding of 4-1/2 cm abdominal aortic aneurysm  HPI: [name] is a 62 y.o. male, who is here today for discussion of incidental finding of 4-1/2 cm infrarenal abdominal aortic aneurysm.  He had a ureteral stone and underwent a CT scan on 05/30/2017.  This showed an infrarenal 4-1/2 cm abdominal aortic aneurysm.  This extended down to the level of the bifurcation.  He does have atherosclerotic change of his iliac vessels but no aneurysms.  He had no known prior history of aneurysm.  He is well-known to me from prior lright carotid endarterectomy in 2011.  He has not been seen in our office since 2014.  He reports that he was unable to continue his surveillance of his carotid arteries due to financial concerns.  Fortunately he has had no neurologic deficits.  Past Medical History:  Diagnosis Date  . AAA (abdominal aortic aneurysm) (HCC)    4.6 cm x 4. 5 cm per 05-30-17 [facility] ct referred to dr Kadir Azucena to see april 2019  . Anemia   . Artery stenosis (HCC)    L carotid  . Arthritis    BACK  . Back pain   . Complication of anesthesia    WOKE UP DURING COLONSCOPY  . COPD (chronic obstructive pulmonary disease) ([place])   . CVA (cerebral infarction) 05/27/09   TIA, posterior circulation 7/11  . Depression   . Glaucoma    LEFT EYE WORSE  . Groin swelling   . Hemorrhage 1996   intracerebral  . History of kidney stones   . HLD (hyperlipidemia)   . HTN (hypertension)   . ICAO (internal carotid artery occlusion)    right ICA stenosis, s/p R carotid endarterectomy on 06/13/09  . Meningitis    viral; as a child   . Monoclonal gammopathy of unknown significance 02/07/2013   No anemia,no elevation of Iggs,  no m-spike, ?monoclonal protein on IFE 9/14  . PONV (postoperative nausea and  vomiting)    NAUSEA  . Seizure disorder ([place])    at time of [place] in 1996.   . Seizures ([place])     5 years ago  . Stroke [facility]) March 2011    Family History  Problem Relation Age of Onset  . Heart disease Father        Heart Disease before age 44  . Heart attack Father   . Heart disease Mother   . Hyperlipidemia Brother   . Hyperlipidemia Sister   . Heart attack Sister        in younger sister    SOCIAL HISTORY: Social History   Socioeconomic History  . Marital status: Married    Spouse name: Not on file  . Number of children: 2  . Years of education: 12+  . Highest education level: Not on file  Occupational History  . Occupation: ELECTRICIAN    Employer: [employer]  Social Needs  . Financial resource strain: Not on file  . Food insecurity:    Worry: Not on file    Inability: Not on file  . Transportation needs:    Medical: Not on file    Non-medical: Not on file  Tobacco Use  . Smoking status: Current Every Day Smoker    Packs/day: 1.00    Types: Cigarettes  . Smokeless tobacco: Current User    Types:  Chew  Substance and Sexual Activity  . 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 Drug use: Yes    Types: Marijuana    Comment: MARIJUANA 30 YRS AGO  . Sexual activity: Not on file  Lifestyle  . Physical activity:    Days per week: Not on file    Minutes per session: Not on file  . Stress: Not on file  Relationships  . Social connections:    Talks on phone: Not on file    Gets together: Not on file    Attends religious service: Not on file    Active member of club or organization: Not on file    Attends meetings of clubs or organizations: Not on file    Relationship status: Not on file  . Intimate partner violence:    Fear of current or ex partner: Not on file    Emotionally abused: Not on file    Physically abused: Not on file    Forced sexual activity: Not on file    Other Topics Concern  . Not on file  Social History Narrative   Married, lives in [place] with his wife.   2 children (daughter, Janett Billow, is a Marine scientist on [employer] at [employer])    Patient is right handed   Patient has a high school education with some college.   Patient drinks at least 5 cups daily.          Allergies  Allergen Reactions  . Tape Rash    ADHESIVE    Current Outpatient Medications  Medication Sig Dispense Refill  . acetaminophen (TYLENOL 8 HOUR) 650 MG CR tablet Take 1 tablet (650 mg total) by mouth every 8 (eight) hours as needed for pain. 30 tablet 0  . ALPRAZolam (XANAX) 0.25 MG tablet     . budesonide-formoterol (SYMBICORT) 80-4.5 MCG/ACT inhaler Inhale 2 puffs into the lungs 2 (two) times daily as needed (for wheezing).     . Cholecalciferol (VITAMIN D) 2000 UNITS tablet Take 2,000 Units by mouth 2 (two) times daily.     Marland Kitchen dipyridamole-aspirin (AGGRENOX) 200-25 MG 12hr capsule Take 1 capsule by mouth 2 (two) times daily. 180 capsule 3  . rosuvastatin (CRESTOR) 20 MG tablet Take 20 mg by mouth daily.    . sertraline (ZOLOFT) 50 MG tablet Take 50 mg by mouth every morning.     . tamsulosin (FLOMAX) 0.4 MG CAPS capsule Take 0.4 mg by mouth daily after supper.    . TEGRETOL-XR 400 MG 12 hr tablet Take 1 tablet (400 mg total) by mouth 4 (four) times daily. 360 tablet 3  . valsartan-hydrochlorothiazide (DIOVAN-HCT) 160-25 MG per tablet Take 0.5 tablets by mouth at bedtime.     Marland Kitchen alfuzosin (UROXATRAL) 10 MG 24 hr tablet Take 1 tablet (10 mg total) by mouth daily with breakfast. (Patient not taking: Reported on 07/12/2017) 30 tablet 11  . HYDROcodone-acetaminophen (NORCO) 5-325 MG tablet Take 1 tablet by mouth every 4 (four) hours as needed for moderate pain. (Patient not taking: Reported on 07/12/2017) 20 tablet 0  . oxybutynin (DITROPAN XL) 10 MG 24 hr tablet Take 1 tablet (10 mg total) by mouth daily. (Patient not taking: Reported on 07/12/2017) 30 tablet 1   No current  facility-administered medications for this visit.     REVIEW OF SYSTEMS:  [X]  denotes positive finding, [ ]  denotes negative finding Cardiac  Comments:  Chest pain or chest pressure:    Shortness of breath upon exertion: x   Short of breath when lying flat:    Irregular heart rhythm:        Vascular    Pain in calf, thigh, or hip brought on by ambulation: x   Pain in feet at night that wakes you up from your sleep:     Blood clot in your veins:    Leg swelling:         Pulmonary    Oxygen at home:    Productive cough:     Wheezing:         Neurologic    Sudden weakness in arms or legs:     Sudden numbness in arms or legs:     Sudden onset of difficulty speaking or slurred speech:    Temporary loss of vision in one eye:     Problems with dizziness:         Gastrointestinal    Blood in stool:     Vomited blood:         Genitourinary    Burning when urinating:     Blood in urine:        Psychiatric    Major depression:         Hematologic    Bleeding problems:    Problems with blood clotting too easily:        Skin    Rashes or ulcers:        Constitutional    Fever or chills:      PHYSICAL EXAM: Vitals:   07/12/17 1100 07/12/17 1106  BP: 139/77 126/75  Pulse: 81   Resp: 16   Temp: 98 F (36.7 C)   TempSrc: Oral   SpO2: 96%   Weight: 180 lb 9.6 oz (81.9 kg)   Height: 6\' 2"  (1.88 m)     GENERAL: The patient is a well-nourished male, in no acute distress. The vital signs are documented above. CARDIOVASCULAR: Harsh bilateral carotid bruits.  Plus radial pulses bilaterally.  2+ femoral pulses bilaterally. PULMONARY: There is good air exchange  ABDOMEN: Soft and non-tender.  I do not palpate an aneurysm. MUSCULOSKELETAL: There are no major deformities or cyanosis. NEUROLOGIC: No focal weakness or paresthesias are detected. SKIN: There are no ulcers or rashes noted. PSYCHIATRIC: The patient has a normal affect.  DATA:  I reviewed his CT images.  This  does reveal a 4-1/2 cm infrarenal abdominal aortic aneurysm  MEDICAL ISSUES: Discussed the significance of the same reason.  Have recommended a follow-up ultrasound in 1 year.  Explained extremely low risk of rupture at his current size.  Explained that it is young age would consider aneurysm repair if it reached 5-5-1/2 cm in diameter.  Vein signs of leaking aneurysm and he knows to report immediately to the emergency room should this occur.  Regarding his carotid disease, I have recommended repeat carotid duplex due to his harsh bilateral carotid bruits and no follow-up for 5 years.  Will coordinate this with him.   Rosetta Posner, MD FACS [facility] of [facility] Tel [phone] Pager [phone]

## 2017-07-14 ENCOUNTER — Other Ambulatory Visit: Payer: 59

## 2017-07-18 DIAGNOSIS — I10 Essential (primary) hypertension: Secondary | ICD-10-CM | POA: Diagnosis not present

## 2017-07-18 DIAGNOSIS — J449 Chronic obstructive pulmonary disease, unspecified: Secondary | ICD-10-CM | POA: Diagnosis not present

## 2017-07-18 DIAGNOSIS — Z0000 Encounter for general adult medical examination without abnormal findings: Secondary | ICD-10-CM | POA: Diagnosis not present

## 2017-07-18 DIAGNOSIS — I6789 Other cerebrovascular disease: Secondary | ICD-10-CM | POA: Diagnosis not present

## 2017-07-18 DIAGNOSIS — N400 Enlarged prostate without lower urinary tract symptoms: Secondary | ICD-10-CM | POA: Diagnosis not present

## 2017-07-18 DIAGNOSIS — E559 Vitamin D deficiency, unspecified: Secondary | ICD-10-CM | POA: Diagnosis not present

## 2017-07-18 DIAGNOSIS — D472 Monoclonal gammopathy: Secondary | ICD-10-CM | POA: Diagnosis not present

## 2017-07-18 DIAGNOSIS — E785 Hyperlipidemia, unspecified: Secondary | ICD-10-CM | POA: Diagnosis not present

## 2017-07-18 DIAGNOSIS — M545 Low back pain: Secondary | ICD-10-CM | POA: Diagnosis not present

## 2017-07-22 ENCOUNTER — Ambulatory Visit
Admission: RE | Admit: 2017-07-22 | Discharge: 2017-07-22 | Disposition: A | Discharge status: Home / Self Care | Payer: 59 | Source: Ambulatory Visit | Attending: Vascular Surgery | Admitting: Vascular Surgery

## 2017-07-22 DIAGNOSIS — R0989 Other specified symptoms and signs involving the circulatory and respiratory systems: Secondary | ICD-10-CM

## 2017-07-22 DIAGNOSIS — I6523 Occlusion and stenosis of bilateral carotid arteries: Secondary | ICD-10-CM | POA: Diagnosis not present

## 2017-07-25 ENCOUNTER — Telehealth: Payer: Self-pay | Admitting: Vascular Surgery

## 2017-07-25 NOTE — Telephone Encounter (Signed)
-----   Message from Mena Goes, RN sent at 07/25/2017  9:27 AM EDT ----- Regarding: needs asap appt with Dr. Donnetta Hutching to discuss surgery   ----- Message ----- From: Waynetta Sandy, MD Sent: 07/22/2017   5:37 PM To: [facility] [place] [phone] 1956/02/28  I was called by radiology for high grade stenosis of his left ICA. This study was ordered by Dr. Donnetta Hutching and I do not see a f/u appointment with him but he should be seen back in a few weeks.   Erlene Quan

## 2017-07-25 NOTE — Telephone Encounter (Signed)
Spoke to pt for appt 4/23, mld ltter  Pt wanted later appt I did not have it at the time

## 2017-08-02 ENCOUNTER — Ambulatory Visit: Payer: 59 | Admitting: Vascular Surgery

## 2017-08-02 ENCOUNTER — Encounter: Payer: Self-pay | Admitting: *Deleted

## 2017-08-02 ENCOUNTER — Encounter: Payer: Self-pay | Admitting: Vascular Surgery

## 2017-08-02 ENCOUNTER — Other Ambulatory Visit: Payer: Self-pay

## 2017-08-02 ENCOUNTER — Other Ambulatory Visit: Payer: Self-pay | Admitting: *Deleted

## 2017-08-02 VITALS — BP 173/92 | HR 66 | Temp 97.1°F | Resp 16 | Ht 74.0 in | Wt 182.6 lb

## 2017-08-02 DIAGNOSIS — I6522 Occlusion and stenosis of left carotid artery: Secondary | ICD-10-CM

## 2017-08-02 NOTE — H&P (View-Only) (Signed)
Vascular and Vein Specialist of [place]  Patient name: [name] MRN: [id number] DOB: [birth year] Sex: male  REASON FOR VISIT: Just recent carotid duplex  HPI: JERMALE [name] is a 62 y.o. male here today for follow-up.  Well-known to me from prior right carotid endarterectomy years ago.  I had seen him recently with a new finding of a 4-1/2 cm infrarenal abdominal aortic aneurysm.  At that time he was found to have harsh carotid bruits bilaterally and I recommended duplex for follow-up.  This revealed critical stenosis of his left internal carotid artery and moderate stenosis of his right endarterectomy.  He is here today for further discussion.  He specifically denies any left brain symptoms.  He is right-handed.  Past Medical History:  Diagnosis Date  . AAA (abdominal aortic aneurysm) (HCC)    4.6 cm x 4. 5 cm per 05-30-[facility] ct referred to dr Ranvir Renovato to see april 2019  . Anemia   . Artery stenosis (HCC)    L carotid  . Arthritis    BACK  . Back pain   . Complication of anesthesia    WOKE UP DURING COLONSCOPY  . COPD (chronic obstructive pulmonary disease) ([place])   . CVA (cerebral infarction) 05/27/09   TIA, posterior circulation 7/11  . Depression   . Glaucoma    LEFT EYE WORSE  . Groin swelling   . Hemorrhage 1996   intracerebral  . History of kidney stones   . HLD (hyperlipidemia)   . HTN (hypertension)   . ICAO (internal carotid artery occlusion)    right ICA stenosis, s/p R carotid endarterectomy on 06/13/09  . Meningitis    viral; as a child   . Monoclonal gammopathy of unknown significance 02/07/2013   No anemia,no elevation of Iggs,  no m-spike, ?monoclonal protein on IFE 9/14  . PONV (postoperative nausea and vomiting)    NAUSEA  . Seizure disorder ([place])    at time of [place] in 1996.   . Seizures ([place])     5 years ago  . Stroke [facility]) March 2011    Family History  Problem Relation Age of Onset  . Heart  disease Father        Heart Disease before age 26  . Heart attack Father   . Heart disease Mother   . Hyperlipidemia Brother   . Hyperlipidemia Sister   . Heart attack Sister        in younger sister    SOCIAL HISTORY: Social History   Tobacco Use  . Smoking status: Current Every Day Smoker    Packs/day: 1.00    Types: Cigarettes  . Smokeless tobacco: Current User    Types: Chew  Substance Use Topics  . 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 Tape Rash    ADHESIVE    Current Outpatient Medications  Medication Sig Dispense Refill  . acetaminophen (TYLENOL 8 HOUR) 650 MG CR tablet Take 1 tablet (650 mg total) by mouth every 8 (eight) hours as needed for pain. 30 tablet 0  . alfuzosin (UROXATRAL) 10 MG 24 hr tablet Take 1 tablet (10 mg total) by mouth daily with breakfast. 30 tablet 11  . ALPRAZolam (XANAX) 0.25 MG tablet     . budesonide-formoterol (SYMBICORT) 80-4.5 MCG/ACT inhaler Inhale 2 puffs into the lungs  2 (two) times daily as needed (for wheezing).     . Cholecalciferol (VITAMIN D) 2000 UNITS tablet Take 2,000 Units by mouth 2 (two) times daily.     Marland Kitchen dipyridamole-aspirin (AGGRENOX) 200-25 MG 12hr capsule Take 1 capsule by mouth 2 (two) times daily. 180 capsule 3  . oxybutynin (DITROPAN XL) 10 MG 24 hr tablet Take 1 tablet (10 mg total) by mouth daily. 30 tablet 1  . rosuvastatin (CRESTOR) 20 MG tablet Take 20 mg by mouth daily.    . sertraline (ZOLOFT) 50 MG tablet Take 50 mg by mouth every morning.     . tamsulosin (FLOMAX) 0.4 MG CAPS capsule Take 0.4 mg by mouth daily after supper.    . TEGRETOL-XR 400 MG 12 hr tablet Take 1 tablet (400 mg total) by mouth 4 (four) times daily. 360 tablet 3  . valsartan-hydrochlorothiazide (DIOVAN-HCT) 160-25 MG per tablet Take 0.5 tablets by mouth at bedtime.      No current facility-administered  medications for this visit.     REVIEW OF SYSTEMS:  [X]  denotes positive finding, [ ]  denotes negative finding Cardiac  Comments:  Chest pain or chest pressure:    Shortness of breath upon exertion:    Short of breath when lying flat:    Irregular heart rhythm:        Vascular    Pain in calf, thigh, or hip brought on by ambulation:    Pain in feet at night that wakes you up from your sleep:     Blood clot in your veins:    Leg swelling:           PHYSICAL EXAM: Vitals:   08/02/17 0912 08/02/17 0915  BP: (!) 148/87 (!) 173/92  Pulse: 66   Resp: 16   Temp: (!) 97.1 F (36.2 C)   TempSrc: Oral   SpO2: 99%   Weight: 182 lb 9.6 oz (82.8 kg)   Height: 6\' 2"  (1.88 m)     GENERAL: The patient is a well-nourished male, in no acute distress. The vital signs are documented above. CARDIOVASCULAR: 2+ radial pulses bilaterally.  Well-healed right neck incision and harsh left carotid bruit PULMONARY: There is good air exchange  MUSCULOSKELETAL: There are no major deformities or cyanosis. NEUROLOGIC: No focal weakness or paresthesias are detected. SKIN: There are no ulcers or rashes noted. PSYCHIATRIC: The patient has a normal affect.  DATA:  Duplex shows high-grade left internal carotid artery stenosis with markedly elevated diastolic and end-diastolic velocities  MEDICAL ISSUES: Had a long discussion with the patient.  I have recommended left carotid endarterectomy for reduction of stroke risk.  I explained 1 to 1-1/2% risk of stroke with surgery and also the slight risk of bleeding, infection and cranial nerve injury.  He understands and wishes to proceed as soon as possible.  We will schedule this for 08/08/2017.  Understands and expected overnight stay with a discharge on the following day.    Rosetta Posner, MD FACS [facility] Specialists of [facility] Tel [phone] Pager [phone]

## 2017-08-02 NOTE — Progress Notes (Signed)
Vascular and Vein Specialist of [place]  Patient name: [name] MRN: [id number] DOB: Aug 25, 1955 Sex: male  REASON FOR VISIT: Just recent carotid duplex  HPI: [name] is a 62 y.o. male here today for follow-up.  Well-known to me from prior right carotid endarterectomy years ago.  I had seen him recently with a new finding of a 4-1/2 cm infrarenal abdominal aortic aneurysm.  At that time he was found to have harsh carotid bruits bilaterally and I recommended duplex for follow-up.  This revealed critical stenosis of his left internal carotid artery and moderate stenosis of his right endarterectomy.  He is here today for further discussion.  He specifically denies any left brain symptoms.  He is right-handed.  Past Medical History:  Diagnosis Date  . AAA (abdominal aortic aneurysm) (HCC)    4.6 cm x 4. 5 cm per 05-30-17 [facility] ct referred to dr Timberly Yott to see april 2019  . Anemia   . Artery stenosis (HCC)    L carotid  . Arthritis    BACK  . Back pain   . Complication of anesthesia    WOKE UP DURING COLONSCOPY  . COPD (chronic obstructive pulmonary disease) ([place])   . CVA (cerebral infarction) 05/27/09   TIA, posterior circulation 7/11  . Depression   . Glaucoma    LEFT EYE WORSE  . Groin swelling   . Hemorrhage 1996   intracerebral  . History of kidney stones   . HLD (hyperlipidemia)   . HTN (hypertension)   . ICAO (internal carotid artery occlusion)    right ICA stenosis, s/p R carotid endarterectomy on 06/13/09  . Meningitis    viral; as a child   . Monoclonal gammopathy of unknown significance 02/07/2013   No anemia,no elevation of Iggs,  no m-spike, ?monoclonal protein on IFE 9/14  . PONV (postoperative nausea and vomiting)    NAUSEA  . Seizure disorder ([place])    at time of [place] in 1996.   . Seizures ([place])     5 years ago  . Stroke [facility]) March 2011    Family History  Problem Relation Age of Onset  . Heart  disease Father        Heart Disease before age 38  . Heart attack Father   . Heart disease Mother   . Hyperlipidemia Brother   . Hyperlipidemia Sister   . Heart attack Sister        in younger sister    SOCIAL HISTORY: Social History   Tobacco Use  . Smoking status: Current Every Day Smoker    Packs/day: 1.00    Types: Cigarettes  . Smokeless tobacco: Current User    Types: Chew  Substance Use Topics  . 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 Tape Rash    ADHESIVE    Current Outpatient Medications  Medication Sig Dispense Refill  . acetaminophen (TYLENOL 8 HOUR) 650 MG CR tablet Take 1 tablet (650 mg total) by mouth every 8 (eight) hours as needed for pain. 30 tablet 0  . alfuzosin (UROXATRAL) 10 MG 24 hr tablet Take 1 tablet (10 mg total) by mouth daily with breakfast. 30 tablet 11  . ALPRAZolam (XANAX) 0.25 MG tablet     . budesonide-formoterol (SYMBICORT) 80-4.5 MCG/ACT inhaler Inhale 2 puffs into the lungs  2 (two) times daily as needed (for wheezing).     . Cholecalciferol (VITAMIN D) 2000 UNITS tablet Take 2,000 Units by mouth 2 (two) times daily.     Marland Kitchen dipyridamole-aspirin (AGGRENOX) 200-25 MG 12hr capsule Take 1 capsule by mouth 2 (two) times daily. 180 capsule 3  . oxybutynin (DITROPAN XL) 10 MG 24 hr tablet Take 1 tablet (10 mg total) by mouth daily. 30 tablet 1  . rosuvastatin (CRESTOR) 20 MG tablet Take 20 mg by mouth daily.    . sertraline (ZOLOFT) 50 MG tablet Take 50 mg by mouth every morning.     . tamsulosin (FLOMAX) 0.4 MG CAPS capsule Take 0.4 mg by mouth daily after supper.    . TEGRETOL-XR 400 MG 12 hr tablet Take 1 tablet (400 mg total) by mouth 4 (four) times daily. 360 tablet 3  . valsartan-hydrochlorothiazide (DIOVAN-HCT) 160-25 MG per tablet Take 0.5 tablets by mouth at bedtime.      No current facility-administered  medications for this visit.     REVIEW OF SYSTEMS:  [X]  denotes positive finding, [ ]  denotes negative finding Cardiac  Comments:  Chest pain or chest pressure:    Shortness of breath upon exertion:    Short of breath when lying flat:    Irregular heart rhythm:        Vascular    Pain in calf, thigh, or hip brought on by ambulation:    Pain in feet at night that wakes you up from your sleep:     Blood clot in your veins:    Leg swelling:           PHYSICAL EXAM: Vitals:   08/02/17 0912 08/02/17 0915  BP: (!) 148/87 (!) 173/92  Pulse: 66   Resp: 16   Temp: (!) 97.1 F (36.2 C)   TempSrc: Oral   SpO2: 99%   Weight: 182 lb 9.6 oz (82.8 kg)   Height: 6\' 2"  (1.88 m)     GENERAL: The patient is a well-nourished male, in no acute distress. The vital signs are documented above. CARDIOVASCULAR: 2+ radial pulses bilaterally.  Well-healed right neck incision and harsh left carotid bruit PULMONARY: There is good air exchange  MUSCULOSKELETAL: There are no major deformities or cyanosis. NEUROLOGIC: No focal weakness or paresthesias are detected. SKIN: There are no ulcers or rashes noted. PSYCHIATRIC: The patient has a normal affect.  DATA:  Duplex shows high-grade left internal carotid artery stenosis with markedly elevated diastolic and end-diastolic velocities  MEDICAL ISSUES: Had a long discussion with the patient.  I have recommended left carotid endarterectomy for reduction of stroke risk.  I explained 1 to 1-1/2% risk of stroke with surgery and also the slight risk of bleeding, infection and cranial nerve injury.  He understands and wishes to proceed as soon as possible.  We will schedule this for 08/08/2017.  Understands and expected overnight stay with a discharge on the following day.    Rosetta Posner, MD FACS [facility] Tel [phone] Pager [phone]

## 2017-08-04 ENCOUNTER — Other Ambulatory Visit (HOSPITAL_COMMUNITY): Payer: Self-pay | Admitting: *Deleted

## 2017-08-04 ENCOUNTER — Other Ambulatory Visit: Payer: Self-pay

## 2017-08-04 ENCOUNTER — Encounter (HOSPITAL_COMMUNITY): Payer: Self-pay

## 2017-08-04 ENCOUNTER — Encounter (HOSPITAL_COMMUNITY)
Admission: RE | Admit: 2017-08-04 | Discharge: 2017-08-04 | Disposition: A | Discharge status: Home / Self Care | Payer: 59 | Source: Ambulatory Visit | Attending: Vascular Surgery | Admitting: Vascular Surgery

## 2017-08-04 DIAGNOSIS — Z01812 Encounter for preprocedural laboratory examination: Secondary | ICD-10-CM | POA: Insufficient documentation

## 2017-08-04 LAB — URINALYSIS, ROUTINE W REFLEX MICROSCOPIC
BILIRUBIN URINE: NEGATIVE
Glucose, UA: NEGATIVE mg/dL
Hgb urine dipstick: NEGATIVE
Ketones, ur: NEGATIVE mg/dL
Leukocytes, UA: NEGATIVE
Nitrite: NEGATIVE
PH: 6 (ref 5.0–8.0)
Protein, ur: NEGATIVE mg/dL
SPECIFIC GRAVITY, URINE: 1.016 (ref 1.005–1.030)

## 2017-08-04 LAB — COMPREHENSIVE METABOLIC PANEL
ALT: 15 U/L — ABNORMAL LOW (ref 17–63)
ANION GAP: 11 (ref 5–15)
AST: 18 U/L (ref 15–41)
Albumin: 3.7 g/dL (ref 3.5–5.0)
Alkaline Phosphatase: 104 U/L (ref 38–126)
BUN: 26 mg/dL — ABNORMAL HIGH (ref 6–20)
CHLORIDE: 103 mmol/L (ref 101–111)
CO2: 22 mmol/L (ref 22–32)
Calcium: 9.4 mg/dL (ref 8.9–10.3)
Creatinine, Ser: 1.56 mg/dL — ABNORMAL HIGH (ref 0.61–1.24)
GFR, EST AFRICAN AMERICAN: 53 mL/min — AB (ref >60)
GFR, EST NON AFRICAN AMERICAN: 46 mL/min — AB (ref >60)
Glucose, Bld: 95 mg/dL (ref 65–99)
POTASSIUM: 4 mmol/L (ref 3.5–5.1)
SODIUM: 136 mmol/L (ref 135–145)
Total Bilirubin: 0.5 mg/dL (ref 0.3–1.2)
Total Protein: 7 g/dL (ref 6.5–8.1)

## 2017-08-04 LAB — CBC
HCT: 39.5 % (ref 39.0–52.0)
Hemoglobin: 13.1 g/dL (ref 13.0–17.0)
MCH: 31.9 pg (ref 26.0–34.0)
MCHC: 33.2 g/dL (ref 30.0–36.0)
MCV: 96.1 fL (ref 78.0–100.0)
PLATELETS: 225 10*3/uL (ref 150–400)
RBC: 4.11 MIL/uL — AB (ref 4.22–5.81)
RDW: 13.3 % (ref 11.5–15.5)
WBC: 7.3 10*3/uL (ref 4.0–10.5)

## 2017-08-04 LAB — TYPE AND SCREEN
ABO/RH(D): O POS
ANTIBODY SCREEN: NEGATIVE

## 2017-08-04 LAB — SURGICAL PCR SCREEN
MRSA, PCR: NEGATIVE
STAPHYLOCOCCUS AUREUS: NEGATIVE

## 2017-08-04 LAB — PROTIME-INR
INR: 1.04
PROTHROMBIN TIME: 13.5 s (ref 11.4–15.2)

## 2017-08-04 LAB — APTT: aPTT: 32 seconds (ref 24–36)

## 2017-08-04 NOTE — Progress Notes (Signed)
Pt denies cardiac history. Pt has had a stroke in the past with no residual weakness or paralysis. Pt is on Aggrenox, pt was instructed to stop Aggrenox on 08/05/17 and to start Aspirin 325 mg on 08/06/17 by Dr. Donnetta Hutching. Pt was knowledgeable about these instructions.

## 2017-08-04 NOTE — Pre-Procedure Instructions (Signed)
[name]  08/04/2017    Your procedure is scheduled on Monday, August 08, 2017 at 9:30 AM.   Report to [facility] Entrance "A" Admitting Office at 7:30 AM.   Call this number if you have problems the morning of surgery: [phone]   Questions prior to day of surgery, please call [phone] between 8 & 4 PM.   Remember:  Do not eat food or drink liquids after midnight Sunday, 08/07/17.  Take these medicines the morning of surgery with A SIP OF WATER: Aspirin, Rosuvastatin (Crestor), Sertraline (Zoloft), Tegretol, Symbicort inhaler - if needed.  Stop Aggrenox (Dipyridamole-Aspirin) tomorrow (08/05/17) and start Aspirin 325 mg on Saturday (08/06/17) as instructed by Dr. Donnetta Hutching.  Do not smoke 24 hours prior to surgery.   Do not wear jewelry.  Do not wear lotions, powders, cologne or deodorant.  Men may shave face and neck.  Do not bring valuables to the hospital.  [facility] is not responsible for any belongings or valuables.  Contacts, dentures or bridgework may not be worn into surgery.  Leave your suitcase in the car.  After surgery it may be brought to your room.  For patients admitted to the hospital, discharge time will be determined by your treatment team.  [facility] - Preparing for Surgery  Before surgery, you can play an important role.  Because skin is not sterile, your skin needs to be as free of germs as possible.  You can reduce the number of germs on you skin by washing with CHG (chlorahexidine gluconate) soap before surgery.  CHG is an antiseptic cleaner which kills germs and bonds with the skin to continue killing germs even after washing.  Please DO NOT use if you have an allergy to CHG or antibacterial soaps.  If your skin becomes reddened/irritated stop using the CHG and inform your nurse when you arrive at Short Stay.  Do not shave (including legs and underarms) for at least 48 hours prior to the first CHG shower.  You may shave your  face.  Please follow these instructions carefully:   1.  Shower with CHG Soap the night before surgery and the                    morning of Surgery.  2.  If you choose to wash your hair, wash your hair first as usual with your       normal shampoo.  3.  After you shampoo, rinse your hair and body thoroughly to remove the shampoo.  4.  Use CHG as you would any other liquid soap.  You can apply chg directly       to the skin and wash gently with scrungie or a clean washcloth.  5.  Apply the CHG Soap to your body ONLY FROM THE NECK DOWN.        Do not use on open wounds or open sores.  Avoid contact with your eyes, ears, mouth and genitals (private parts).  Wash genitals (private parts) with your normal soap.  6.  Wash thoroughly, paying special attention to the area where your surgery        will be performed.  7.  Thoroughly rinse your body with warm water from the neck down.  8.  DO NOT shower/wash with your normal soap after using and rinsing off       the CHG Soap.  9.  Pat yourself dry with a clean towel.  10.  Wear clean pajamas.            11.  Place clean sheets on your bed the night of your first shower and do not        sleep with pets.  Day of Surgery  Shower as above. Do not apply any lotions/deodorants the morning of surgery.  Please wear clean clothes to the hospital.   Please read over the fact sheets that you were given.

## 2017-08-08 ENCOUNTER — Inpatient Hospital Stay (HOSPITAL_COMMUNITY): Payer: 59 | Admitting: Anesthesiology

## 2017-08-08 ENCOUNTER — Other Ambulatory Visit: Payer: Self-pay

## 2017-08-08 ENCOUNTER — Inpatient Hospital Stay (HOSPITAL_COMMUNITY)
Admission: RE | Admit: 2017-08-08 | Discharge: 2017-08-09 | DRG: 039 | Disposition: A | Discharge status: Home / Self Care | Payer: 59 | Source: Ambulatory Visit | Attending: Vascular Surgery | Admitting: Vascular Surgery

## 2017-08-08 ENCOUNTER — Encounter (HOSPITAL_COMMUNITY): Payer: Self-pay | Admitting: Surgery

## 2017-08-08 ENCOUNTER — Telehealth: Payer: Self-pay

## 2017-08-08 ENCOUNTER — Encounter (HOSPITAL_COMMUNITY)
Admission: RE | Disposition: A | Discharge status: Home / Self Care | Payer: Self-pay | Source: Ambulatory Visit | Attending: Vascular Surgery

## 2017-08-08 DIAGNOSIS — Z91048 Other nonmedicinal substance allergy status: Secondary | ICD-10-CM | POA: Diagnosis not present

## 2017-08-08 DIAGNOSIS — E785 Hyperlipidemia, unspecified: Secondary | ICD-10-CM | POA: Diagnosis present

## 2017-08-08 DIAGNOSIS — Z8673 Personal history of transient ischemic attack (TIA), and cerebral infarction without residual deficits: Secondary | ICD-10-CM

## 2017-08-08 DIAGNOSIS — F17210 Nicotine dependence, cigarettes, uncomplicated: Secondary | ICD-10-CM | POA: Diagnosis present

## 2017-08-08 DIAGNOSIS — I714 Abdominal aortic aneurysm, without rupture: Secondary | ICD-10-CM | POA: Diagnosis present

## 2017-08-08 DIAGNOSIS — Z8349 Family history of other endocrine, nutritional and metabolic diseases: Secondary | ICD-10-CM | POA: Diagnosis not present

## 2017-08-08 DIAGNOSIS — D472 Monoclonal gammopathy: Secondary | ICD-10-CM | POA: Diagnosis present

## 2017-08-08 DIAGNOSIS — Z8249 Family history of ischemic heart disease and other diseases of the circulatory system: Secondary | ICD-10-CM

## 2017-08-08 DIAGNOSIS — I739 Peripheral vascular disease, unspecified: Secondary | ICD-10-CM | POA: Diagnosis present

## 2017-08-08 DIAGNOSIS — I10 Essential (primary) hypertension: Secondary | ICD-10-CM | POA: Diagnosis present

## 2017-08-08 DIAGNOSIS — F329 Major depressive disorder, single episode, unspecified: Secondary | ICD-10-CM | POA: Diagnosis present

## 2017-08-08 DIAGNOSIS — J449 Chronic obstructive pulmonary disease, unspecified: Secondary | ICD-10-CM | POA: Diagnosis present

## 2017-08-08 DIAGNOSIS — I6522 Occlusion and stenosis of left carotid artery: Secondary | ICD-10-CM | POA: Diagnosis not present

## 2017-08-08 DIAGNOSIS — D649 Anemia, unspecified: Secondary | ICD-10-CM | POA: Diagnosis present

## 2017-08-08 DIAGNOSIS — I6529 Occlusion and stenosis of unspecified carotid artery: Secondary | ICD-10-CM | POA: Diagnosis present

## 2017-08-08 DIAGNOSIS — Z7902 Long term (current) use of antithrombotics/antiplatelets: Secondary | ICD-10-CM | POA: Diagnosis not present

## 2017-08-08 DIAGNOSIS — Z7951 Long term (current) use of inhaled steroids: Secondary | ICD-10-CM | POA: Diagnosis not present

## 2017-08-08 DIAGNOSIS — G40909 Epilepsy, unspecified, not intractable, without status epilepticus: Secondary | ICD-10-CM | POA: Diagnosis present

## 2017-08-08 HISTORY — PX: ENDARTERECTOMY: SHX5162

## 2017-08-08 HISTORY — PX: PATCH ANGIOPLASTY: SHX6230

## 2017-08-08 SURGERY — ENDARTERECTOMY, CAROTID
Anesthesia: General | Site: Neck | Laterality: Left

## 2017-08-08 MED ORDER — LABETALOL HCL 5 MG/ML IV SOLN: 10.0000 mg | INTRAVENOUS | Status: DC | PRN

## 2017-08-08 MED ORDER — LACTATED RINGERS IV SOLN
INTRAVENOUS | Status: DC
  Administered 2017-08-08: 08:00:00 via INTRAVENOUS

## 2017-08-08 MED ORDER — VITAMIN D 1000 UNITS PO TABS
2000.0000 [IU] | ORAL_TABLET | Freq: Every day | ORAL | Status: DC
  Administered 2017-08-09: 2000 [IU] via ORAL
  Filled 2017-08-08: qty 2

## 2017-08-08 MED ORDER — DOCUSATE SODIUM 100 MG PO CAPS
100.0000 mg | ORAL_CAPSULE | Freq: Every day | ORAL | Status: DC
  Administered 2017-08-09: 100 mg via ORAL
  Filled 2017-08-08: qty 1

## 2017-08-08 MED ORDER — ROSUVASTATIN CALCIUM 20 MG PO TABS
20.0000 mg | ORAL_TABLET | Freq: Every day | ORAL | Status: DC
  Administered 2017-08-08: 20 mg via ORAL
  Filled 2017-08-08: qty 2
  Filled 2017-08-08: qty 1

## 2017-08-08 MED ORDER — TAMSULOSIN HCL 0.4 MG PO CAPS
0.4000 mg | ORAL_CAPSULE | Freq: Every day | ORAL | Status: DC
  Administered 2017-08-08: 0.4 mg via ORAL
  Filled 2017-08-08: qty 1

## 2017-08-08 MED ORDER — PROPOFOL 10 MG/ML IV BOLUS
INTRAVENOUS | Status: DC | PRN
  Administered 2017-08-08: 160 mg via INTRAVENOUS
  Administered 2017-08-08: 40 mg via INTRAVENOUS

## 2017-08-08 MED ORDER — HEPARIN SODIUM (PORCINE) 5000 UNIT/ML IJ SOLN
INTRAMUSCULAR | Status: AC
  Filled 2017-08-08: qty 1.2

## 2017-08-08 MED ORDER — METOPROLOL TARTRATE 5 MG/5ML IV SOLN: 2.0000 mg | INTRAVENOUS | Status: DC | PRN

## 2017-08-08 MED ORDER — FENTANYL CITRATE (PF) 250 MCG/5ML IJ SOLN
INTRAMUSCULAR | Status: AC
  Filled 2017-08-08: qty 5

## 2017-08-08 MED ORDER — HEPARIN SODIUM (PORCINE) 1000 UNIT/ML IJ SOLN
INTRAMUSCULAR | Status: DC | PRN
  Administered 2017-08-08: 8000 [IU] via INTRAVENOUS

## 2017-08-08 MED ORDER — HYDROMORPHONE HCL 2 MG/ML IJ SOLN
0.2500 mg | INTRAMUSCULAR | Status: DC | PRN
  Administered 2017-08-08 (×2): 0.5 mg via INTRAVENOUS

## 2017-08-08 MED ORDER — MIDAZOLAM HCL 2 MG/2ML IJ SOLN
INTRAMUSCULAR | Status: AC
  Filled 2017-08-08: qty 2

## 2017-08-08 MED ORDER — NITROGLYCERIN 0.2 MG/ML ON CALL CATH LAB
INTRAVENOUS | Status: DC | PRN
  Administered 2017-08-08: 20 ug via INTRAVENOUS

## 2017-08-08 MED ORDER — PHENOL 1.4 % MT LIQD: 1.0000 | OROMUCOSAL | Status: DC | PRN

## 2017-08-08 MED ORDER — HYDROCHLOROTHIAZIDE 12.5 MG PO CAPS
12.5000 mg | ORAL_CAPSULE | Freq: Every day | ORAL | Status: DC
  Administered 2017-08-08: 12.5 mg via ORAL
  Filled 2017-08-08: qty 1

## 2017-08-08 MED ORDER — DEXTROSE 5 % IV SOLN
INTRAVENOUS | Status: DC | PRN
  Administered 2017-08-08: 30 ug/min via INTRAVENOUS

## 2017-08-08 MED ORDER — CEFAZOLIN SODIUM-DEXTROSE 2-4 GM/100ML-% IV SOLN
2.0000 g | Freq: Three times a day (TID) | INTRAVENOUS | Status: AC
  Administered 2017-08-08 – 2017-08-09 (×2): 2 g via INTRAVENOUS
  Filled 2017-08-08 (×2): qty 100

## 2017-08-08 MED ORDER — CHLORHEXIDINE GLUCONATE CLOTH 2 % EX PADS: 6.0000 | MEDICATED_PAD | Freq: Once | CUTANEOUS | Status: DC

## 2017-08-08 MED ORDER — ACETAMINOPHEN 325 MG PO TABS
325.0000 mg | ORAL_TABLET | ORAL | Status: DC | PRN
Start: 2017-08-08 — End: 2017-08-09

## 2017-08-08 MED ORDER — SODIUM CHLORIDE 0.9 % IV SOLN: INTRAVENOUS | Status: DC

## 2017-08-08 MED ORDER — PROTAMINE SULFATE 10 MG/ML IV SOLN
INTRAVENOUS | Status: DC | PRN
  Administered 2017-08-08 (×2): 10 mg via INTRAVENOUS
  Administered 2017-08-08 (×2): 15 mg via INTRAVENOUS

## 2017-08-08 MED ORDER — ASPIRIN-DIPYRIDAMOLE ER 25-200 MG PO CP12
1.0000 | ORAL_CAPSULE | Freq: Every day | ORAL | Status: DC
  Administered 2017-08-09: 1 via ORAL
  Filled 2017-08-08: qty 1

## 2017-08-08 MED ORDER — SUGAMMADEX SODIUM 200 MG/2ML IV SOLN
INTRAVENOUS | Status: DC | PRN
  Administered 2017-08-08: 200 mg via INTRAVENOUS

## 2017-08-08 MED ORDER — VALSARTAN-HYDROCHLOROTHIAZIDE 160-25 MG PO TABS: 0.5000 | ORAL_TABLET | Freq: Every day | ORAL | Status: DC

## 2017-08-08 MED ORDER — POTASSIUM CHLORIDE CRYS ER 20 MEQ PO TBCR: 20.0000 meq | EXTENDED_RELEASE_TABLET | Freq: Every day | ORAL | Status: DC | PRN

## 2017-08-08 MED ORDER — ACETAMINOPHEN 325 MG RE SUPP: 325.0000 mg | RECTAL | Status: DC | PRN

## 2017-08-08 MED ORDER — MAGNESIUM SULFATE 2 GM/50ML IV SOLN: 2.0000 g | Freq: Every day | INTRAVENOUS | Status: DC | PRN

## 2017-08-08 MED ORDER — CEFAZOLIN SODIUM-DEXTROSE 2-4 GM/100ML-% IV SOLN
INTRAVENOUS | Status: AC
  Filled 2017-08-08: qty 100

## 2017-08-08 MED ORDER — ONDANSETRON HCL 4 MG/2ML IJ SOLN: 4.0000 mg | Freq: Four times a day (QID) | INTRAMUSCULAR | Status: DC | PRN

## 2017-08-08 MED ORDER — OXYCODONE-ACETAMINOPHEN 5-325 MG PO TABS
1.0000 | ORAL_TABLET | ORAL | Status: DC | PRN
  Administered 2017-08-08 – 2017-08-09 (×4): 2 via ORAL
  Filled 2017-08-08: qty 1
  Filled 2017-08-08 (×3): qty 2
  Filled 2017-08-08: qty 1

## 2017-08-08 MED ORDER — ONDANSETRON HCL 4 MG/2ML IJ SOLN
INTRAMUSCULAR | Status: DC | PRN
  Administered 2017-08-08: 4 mg via INTRAVENOUS

## 2017-08-08 MED ORDER — ASPIRIN 325 MG PO TABS
325.0000 mg | ORAL_TABLET | Freq: Every day | ORAL | Status: DC
  Administered 2017-08-09: 325 mg via ORAL
  Filled 2017-08-08: qty 1

## 2017-08-08 MED ORDER — HYDROMORPHONE HCL 2 MG/ML IJ SOLN
INTRAMUSCULAR | Status: AC
  Filled 2017-08-08: qty 1

## 2017-08-08 MED ORDER — ROCURONIUM BROMIDE 10 MG/ML (PF) SYRINGE
PREFILLED_SYRINGE | INTRAVENOUS | Status: DC | PRN
  Administered 2017-08-08: 10 mg via INTRAVENOUS
  Administered 2017-08-08: 20 mg via INTRAVENOUS
  Administered 2017-08-08: 50 mg via INTRAVENOUS
  Administered 2017-08-08: 20 mg via INTRAVENOUS
  Administered 2017-08-08: 10 mg via INTRAVENOUS
  Administered 2017-08-08 (×2): 20 mg via INTRAVENOUS

## 2017-08-08 MED ORDER — IRBESARTAN 150 MG PO TABS
75.0000 mg | ORAL_TABLET | Freq: Every day | ORAL | Status: DC
  Administered 2017-08-08: 75 mg via ORAL
  Filled 2017-08-08: qty 1

## 2017-08-08 MED ORDER — PHENYLEPHRINE 40 MCG/ML (10ML) SYRINGE FOR IV PUSH (FOR BLOOD PRESSURE SUPPORT)
PREFILLED_SYRINGE | INTRAVENOUS | Status: DC | PRN
  Administered 2017-08-08 (×2): 80 ug via INTRAVENOUS

## 2017-08-08 MED ORDER — HYDRALAZINE HCL 20 MG/ML IJ SOLN: 5.0000 mg | INTRAMUSCULAR | Status: DC | PRN

## 2017-08-08 MED ORDER — PANTOPRAZOLE SODIUM 40 MG PO TBEC
40.0000 mg | DELAYED_RELEASE_TABLET | Freq: Every day | ORAL | Status: DC
  Administered 2017-08-09: 40 mg via ORAL
  Filled 2017-08-08: qty 1

## 2017-08-08 MED ORDER — FLUTICASONE FUROATE-VILANTEROL 100-25 MCG/INH IN AEPB
1.0000 | INHALATION_SPRAY | Freq: Every day | RESPIRATORY_TRACT | Status: DC
  Administered 2017-08-09: 1 via RESPIRATORY_TRACT
  Filled 2017-08-08: qty 28

## 2017-08-08 MED ORDER — MIDAZOLAM HCL 2 MG/2ML IJ SOLN
INTRAMUSCULAR | Status: DC | PRN
  Administered 2017-08-08: 2 mg via INTRAVENOUS

## 2017-08-08 MED ORDER — SODIUM CHLORIDE 0.9 % IV SOLN
INTRAVENOUS | Status: DC | PRN
  Administered 2017-08-08: 10:00:00

## 2017-08-08 MED ORDER — MORPHINE SULFATE (PF) 4 MG/ML IV SOLN: 4.0000 mg | INTRAVENOUS | Status: DC | PRN

## 2017-08-08 MED ORDER — CEFAZOLIN SODIUM-DEXTROSE 2-4 GM/100ML-% IV SOLN
2.0000 g | INTRAVENOUS | Status: AC
  Administered 2017-08-08: 2 g via INTRAVENOUS

## 2017-08-08 MED ORDER — GUAIFENESIN-DM 100-10 MG/5ML PO SYRP
15.0000 mL | ORAL_SOLUTION | ORAL | Status: DC | PRN
  Administered 2017-08-08: 15 mL via ORAL
  Filled 2017-08-08: qty 15

## 2017-08-08 MED ORDER — PROPOFOL 10 MG/ML IV BOLUS
INTRAVENOUS | Status: AC
  Filled 2017-08-08: qty 20

## 2017-08-08 MED ORDER — LIDOCAINE 2% (20 MG/ML) 5 ML SYRINGE
INTRAMUSCULAR | Status: DC | PRN
  Administered 2017-08-08: 60 mg via INTRAVENOUS

## 2017-08-08 MED ORDER — DEXAMETHASONE SODIUM PHOSPHATE 10 MG/ML IJ SOLN
INTRAMUSCULAR | Status: DC | PRN
  Administered 2017-08-08: 10 mg via INTRAVENOUS

## 2017-08-08 MED ORDER — LACTATED RINGERS IV SOLN
INTRAVENOUS | Status: DC | PRN
  Administered 2017-08-08 (×2): via INTRAVENOUS

## 2017-08-08 MED ORDER — ALUM & MAG HYDROXIDE-SIMETH 200-200-20 MG/5ML PO SUSP: 15.0000 mL | ORAL | Status: DC | PRN

## 2017-08-08 MED ORDER — SODIUM CHLORIDE 0.9 % IV SOLN: 500.0000 mL | Freq: Once | INTRAVENOUS | Status: DC | PRN

## 2017-08-08 MED ORDER — 0.9 % SODIUM CHLORIDE (POUR BTL) OPTIME
TOPICAL | Status: DC | PRN
  Administered 2017-08-08: 2000 mL

## 2017-08-08 MED ORDER — FENTANYL CITRATE (PF) 250 MCG/5ML IJ SOLN
INTRAMUSCULAR | Status: DC | PRN
  Administered 2017-08-08: 50 ug via INTRAVENOUS
  Administered 2017-08-08: 100 ug via INTRAVENOUS
  Administered 2017-08-08 (×4): 25 ug via INTRAVENOUS

## 2017-08-08 MED ORDER — LIDOCAINE HCL 1 % IJ SOLN
INTRAMUSCULAR | Status: AC
  Filled 2017-08-08: qty 20

## 2017-08-08 MED ORDER — POLYETHYLENE GLYCOL 3350 17 G PO PACK: 17.0000 g | PACK | Freq: Every day | ORAL | Status: DC | PRN

## 2017-08-08 MED ORDER — CARBAMAZEPINE ER 200 MG PO TB12
400.0000 mg | ORAL_TABLET | Freq: Four times a day (QID) | ORAL | Status: DC
  Administered 2017-08-08 – 2017-08-09 (×3): 400 mg via ORAL
  Filled 2017-08-08 (×4): qty 1
  Filled 2017-08-08: qty 2

## 2017-08-08 MED ORDER — SERTRALINE HCL 50 MG PO TABS
50.0000 mg | ORAL_TABLET | Freq: Every morning | ORAL | Status: DC
  Administered 2017-08-08 – 2017-08-09 (×2): 50 mg via ORAL
  Filled 2017-08-08 (×2): qty 1

## 2017-08-08 SURGICAL SUPPLY — 35 items
ADH SKN CLS APL DERMABOND .7 (GAUZE/BANDAGES/DRESSINGS) ×1
CANISTER SUCT 3000ML PPV (MISCELLANEOUS) ×2 IMPLANT
CANNULA VESSEL 3MM 2 BLNT TIP (CANNULA) ×4 IMPLANT
CATH ROBINSON RED A/P 18FR (CATHETERS) ×2 IMPLANT
CLIP LIGATING EXTRA MED SLVR (CLIP) ×2 IMPLANT
CLIP LIGATING EXTRA SM BLUE (MISCELLANEOUS) ×2 IMPLANT
CRADLE DONUT ADULT HEAD (MISCELLANEOUS) ×2 IMPLANT
DERMABOND ADVANCED (GAUZE/BANDAGES/DRESSINGS) ×1
DERMABOND ADVANCED .7 DNX12 (GAUZE/BANDAGES/DRESSINGS) ×1 IMPLANT
ELECT REM PT RETURN 9FT ADLT (ELECTROSURGICAL) ×2
ELECTRODE REM PT RTRN 9FT ADLT (ELECTROSURGICAL) ×1 IMPLANT
GLOVE BIO SURGEON STRL SZ7 (GLOVE) ×1 IMPLANT
GLOVE BIOGEL PI IND STRL 6.5 (GLOVE) IMPLANT
GLOVE BIOGEL PI IND STRL 7.0 (GLOVE) IMPLANT
GLOVE BIOGEL PI INDICATOR 6.5 (GLOVE) ×1
GLOVE BIOGEL PI INDICATOR 7.0 (GLOVE) ×2
GLOVE SS BIOGEL STRL SZ 7.5 (GLOVE) ×1 IMPLANT
GLOVE SUPERSENSE BIOGEL SZ 7.5 (GLOVE) ×1
GOWN STRL REUS W/ TWL LRG LVL3 (GOWN DISPOSABLE) ×3 IMPLANT
GOWN STRL REUS W/ TWL XL LVL3 (GOWN DISPOSABLE) IMPLANT
GOWN STRL REUS W/TWL LRG LVL3 (GOWN DISPOSABLE) ×6
GOWN STRL REUS W/TWL XL LVL3 (GOWN DISPOSABLE) ×2
KIT BASIN OR (CUSTOM PROCEDURE TRAY) ×2 IMPLANT
KIT TURNOVER KIT B (KITS) ×2 IMPLANT
NS IRRIG 1000ML POUR BTL (IV SOLUTION) ×4 IMPLANT
PACK CAROTID (CUSTOM PROCEDURE TRAY) ×2 IMPLANT
PAD ARMBOARD 7.5X6 YLW CONV (MISCELLANEOUS) ×4 IMPLANT
PATCH HEMASHIELD 8X75 (Vascular Products) ×1 IMPLANT
SHUNT CAROTID BYPASS 10 (VASCULAR PRODUCTS) ×1 IMPLANT
SUT PROLENE 6 0 CC (SUTURE) ×4 IMPLANT
SUT VIC AB 3-0 SH 27 (SUTURE) ×4
SUT VIC AB 3-0 SH 27X BRD (SUTURE) ×2 IMPLANT
SUT VICRYL 4-0 PS2 18IN ABS (SUTURE) ×2 IMPLANT
TOWEL GREEN STERILE (TOWEL DISPOSABLE) ×2 IMPLANT
WATER STERILE IRR 1000ML POUR (IV SOLUTION) ×2 IMPLANT

## 2017-08-08 NOTE — Telephone Encounter (Signed)
Spoke with [name] regarding fax number for [facility] paperwork. FMLA and Disability have both been faxed.

## 2017-08-08 NOTE — Anesthesia Preprocedure Evaluation (Signed)
Anesthesia Evaluation  Patient identified by MRN, date of birth, ID band Patient awake    Reviewed: Allergy & Precautions, NPO status , Patient's Chart, lab work & pertinent test results  History of Anesthesia Complications (+) PONV  Airway Mallampati: II  TM Distance: >3 FB     Dental   Pulmonary COPD, Current Smoker,    breath sounds clear to auscultation       Cardiovascular hypertension, + Peripheral Vascular Disease   Rhythm:Regular Rate:Normal     Neuro/Psych    GI/Hepatic negative GI ROS, Neg liver ROS,   Endo/Other    Renal/GU Renal disease     Musculoskeletal  (+) Arthritis ,   Abdominal   Peds  Hematology  (+) anemia ,   Anesthesia Other Findings   Reproductive/Obstetrics                             Anesthesia Physical Anesthesia Plan  ASA: III  Anesthesia Plan: General   Post-op Pain Management:    Induction: Intravenous  PONV Risk Score and Plan: Treatment may vary due to age or medical condition, Ondansetron, Dexamethasone and Midazolam  Airway Management Planned:   Additional Equipment:   Intra-op Plan:   Post-operative Plan: Possible Post-op intubation/ventilation  Informed Consent: I have reviewed the patients History and Physical, chart, labs and discussed the procedure including the risks, benefits and alternatives for the proposed anesthesia with the patient or authorized representative who has indicated his/her understanding and acceptance.   Dental advisory given  Plan Discussed with: CRNA and Anesthesiologist  Anesthesia Plan Comments:         Anesthesia Quick Evaluation

## 2017-08-08 NOTE — Anesthesia Postprocedure Evaluation (Signed)
Anesthesia Post Note  Patient: [name]  Procedure(s) Performed: ENDARTERECTOMY CAROTID LEFT (Left Neck) PATCH ANGIOPLASTY CAROTID LEFT USING HEMASHIELD PATCH (Left Neck)     Patient location during evaluation: PACU Anesthesia Type: General Level of consciousness: awake Pain management: pain level controlled Vital Signs Assessment: post-procedure vital signs reviewed and stable Respiratory status: spontaneous breathing Cardiovascular status: stable Anesthetic complications: no    Last Vitals:  Vitals:   08/08/17 1130 08/08/17 1131  BP: 127/79   Pulse:  80  Resp:  (!) 26  Temp:  36.9 C  SpO2:  100%    Last Pain:  Vitals:   08/08/17 1131  TempSrc:   PainSc: 3                  Shirell Struthers

## 2017-08-08 NOTE — Progress Notes (Signed)
  Day of Surgery Note    Subjective:  Patient waking up from anesthesia   Vitals:   08/08/17 1200 08/08/17 1215  BP: 119/72 120/76  Pulse: 73 68  Resp: 14 14  Temp:    SpO2: 94% 95%    Incisions:   L neck incision without hematoma; trachea midline Extremities:  Moving all extremities well Lungs:  Non labored with [place] Abdomen:  Soft Neuro: CN 3-12 grossly intact   Assessment/Plan:  This is a 62 y.o. male who is s/p  L CEA  -Neuro status remains at baseline -Probable d/c tomorrow if no events overnight -Transfer to 4E when bed becomes available   Dagoberto Ligas, PA-C 08/08/2017 12:44 PM [phone]

## 2017-08-08 NOTE — Interval H&P Note (Signed)
History and Physical Interval Note:  08/08/2017 8:51 AM  [name]  has presented today for surgery, with the diagnosis of left carotid stenosis  The various methods of treatment have been discussed with the patient and family. After consideration of risks, benefits and other options for treatment, the patient has consented to  Procedure(s): ENDARTERECTOMY CAROTID (Left) as a surgical intervention .  The patient's history has been reviewed, patient examined, no change in status, stable for surgery.  I have reviewed the patient's chart and labs.  Questions were answered to the patient's satisfaction.     Curt Jews

## 2017-08-08 NOTE — Op Note (Signed)
    OPERATIVE REPORT  DATE OF SURGERY: 08/08/2017  PATIENT: [name], 62 y.o. male MRN: [id number]  DOB: September 24, 1955  PRE-OPERATIVE DIAGNOSIS: Severe asymptomatic left internal carotid artery stenosis  POST-OPERATIVE DIAGNOSIS:  Same  PROCEDURE: Left carotid endarterectomy and Dacron patch angioplasty  SURGEON:  Curt Jews, M.D.  PHYSICIAN ASSISTANT: Matt Eveland, PA-C  ANESTHESIA: General  EBL: 50 ml  Total I/O In: 1200 [I.V.:1200] Out: 50 [Blood:50]  BLOOD ADMINISTERED: None  DRAINS: None  SPECIMEN: None  COUNTS CORRECT:  YES  PLAN OF CARE: PACU  PATIENT DISPOSITION:  PACU - hemodynamically stable  PROCEDURE DETAILS: Patient was taken to the operating placed supine position where the area of the left next prepped draped you sterile fashion.  An incision was made anterior to the sternocleidomastoid and carried down through the platysma with electrocautery.  The sternocleidomastoid reflected posteriorly and the carotid sheath was opened.  The facial vein was ligated with 2-0 silk ties and divided.  The vagus nerve and hypoglossal nerves were identified and preserved.  The common carotid artery was encircled with an umbilical tape and Rummel tourniquet.  The external carotid was encircled with a blue vessel loop.  The superior thyroid artery was encircled with a 2-0 silk Potts tie.  The internal carotid was encircled with an umbilical tape and Rummel tourniquet.  The patient was given 8000 units of intravenous heparin.  After adequate circulation time the internal/external and common carotid arteries were occluded.  The common carotid artery was opened with an 11 blade and extended longitudinally with Potts scissors through the plaque onto the internal carotid artery.  Patient had a very irregular soft plaque with critical narrowing.  The artery above and below this area at the bifurcation was normal.  A 10 shunt was passed up the internal carotid and allowed to backbleed  and then down the common carotid where it was secured with a Rummel tourniquet.  The endarterectomy was again on the common carotid artery and the plaque was divided proximally with Potts scissors.  Dissection was extended onto the bifurcation.  The external carotid was endarterectomized with an eversion technique and the internal carotid was endarterectomized in an open fashion.  Remaining atheromatous debris was removed from the endarterectomy plane.  A Finesse Hemashield Dacron patch was brought onto the field and was sewn as a patch angioplasty.  Prior to completion of the closure the shunt was removed and the usual flushing maneuvers were undertaken.  The anastomosis was completed and flow was restored first to the external and the internal carotid arteries.  Excellent flow characteristics were noted with hand-held Doppler in the internal and external carotid arteries.  The patient was given 50 mg of protamine to reverse the heparin.  Wounds irrigated with saline.  Hemostasis obtained with cautery.  Wounds were closed with 3-0 Vicryl interrupted to reapproximate the sternocleidomastoid over the carotid sheath.  Next the platysma was closed with a running 3-0 Vicryl suture and finally the skin was closed with a 4-0 subcuticular Vicryl stitch.  Sterile dressing was applied.  The patient was awakened neurologically intact in the operating room and was transferred to the recovery room in stable condition   Rosetta Posner, M.D., [facility] 08/08/2017 11:47 AM

## 2017-08-08 NOTE — Anesthesia Procedure Notes (Signed)
Procedure Name: Intubation Date/Time: 08/08/2017 9:50 AM Performed by: Teressa Lower., CRNA Pre-anesthesia Checklist: Patient identified, Emergency Drugs available, Suction available and Patient being monitored Patient Re-evaluated:Patient Re-evaluated prior to induction Oxygen Delivery Method: Circle system utilized Preoxygenation: Pre-oxygenation with 100% oxygen Induction Type: IV induction Ventilation: Mask ventilation without difficulty and Oral airway inserted - appropriate to patient size Laryngoscope Size: Mac and 4 Grade View: Grade I Tube type: Oral Tube size: 7.5 mm Number of attempts: 1 Airway Equipment and Method: Stylet and Oral airway Placement Confirmation: ETT inserted through vocal cords under direct vision,  positive ETCO2 and breath sounds checked- equal and bilateral Secured at: 23 cm Tube secured with: Tape Dental Injury: Teeth and Oropharynx as per pre-operative assessment

## 2017-08-08 NOTE — Anesthesia Procedure Notes (Addendum)
Arterial Line Insertion Start/End4/29/2019 8:50 AM, 08/08/2017 9:00 AM Performed by: Kyung Rudd, CRNA, CRNA  Patient location: Pre-op. Preanesthetic checklist: patient identified, IV checked, site marked, risks and benefits discussed, surgical consent, monitors and equipment checked, pre-op evaluation, timeout performed and anesthesia consent Lidocaine 1% used for infiltration Right, radial was placed Catheter size: 20 G Hand hygiene performed  and maximum sterile barriers used   Attempts: 1 Procedure performed without using ultrasound guided technique. Following insertion, dressing applied and Biopatch. Post procedure assessment: normal and unchanged  Patient tolerated the procedure well with no immediate complications.

## 2017-08-08 NOTE — Transfer of Care (Signed)
Immediate Anesthesia Transfer of Care Note  Patient: [name]  Procedure(s) Performed: ENDARTERECTOMY CAROTID LEFT (Left Neck) PATCH ANGIOPLASTY CAROTID LEFT USING HEMASHIELD PATCH (Left Neck)  Patient Location: PACU  Anesthesia Type:General  Level of Consciousness: awake, alert  and oriented  Airway & Oxygen Therapy: Patient Spontanous Breathing and Patient connected to face mask oxygen  Post-op Assessment: Report given to RN and Post -op Vital signs reviewed and stable  Post vital signs: Reviewed and stable  Last Vitals:  Vitals Value Taken Time  BP 127/79 08/08/2017 11:30 AM  Temp    Pulse 79 08/08/2017 11:31 AM  Resp 16 08/08/2017 11:31 AM  SpO2 99 % 08/08/2017 11:31 AM  Vitals shown include unvalidated device data.  Last Pain:  Vitals:   08/08/17 0801  TempSrc:   PainSc: 0-No pain      Patients Stated Pain Goal: 3 (53/74/82 [id number])  Complications: No apparent anesthesia complications

## 2017-08-09 ENCOUNTER — Telehealth: Payer: Self-pay | Admitting: Vascular Surgery

## 2017-08-09 ENCOUNTER — Encounter (HOSPITAL_COMMUNITY): Payer: Self-pay | Admitting: Vascular Surgery

## 2017-08-09 LAB — BASIC METABOLIC PANEL
Anion gap: 10 (ref 5–15)
BUN: 17 mg/dL (ref 6–20)
CALCIUM: 8.6 mg/dL — AB (ref 8.9–10.3)
CHLORIDE: 100 mmol/L — AB (ref 101–111)
CO2: 26 mmol/L (ref 22–32)
Creatinine, Ser: 0.99 mg/dL (ref 0.61–1.24)
GFR calc non Af Amer: >60 mL/min (ref >60)
Glucose, Bld: 95 mg/dL (ref 65–99)
Potassium: 3.8 mmol/L (ref 3.5–5.1)
SODIUM: 136 mmol/L (ref 135–145)

## 2017-08-09 LAB — CBC
HEMATOCRIT: 34 % — AB (ref 39.0–52.0)
HEMOGLOBIN: 11 g/dL — AB (ref 13.0–17.0)
MCH: 31.1 pg (ref 26.0–34.0)
MCHC: 32.4 g/dL (ref 30.0–36.0)
MCV: 96 fL (ref 78.0–100.0)
Platelets: 173 10*3/uL (ref 150–400)
RBC: 3.54 MIL/uL — ABNORMAL LOW (ref 4.22–5.81)
RDW: 13 % (ref 11.5–15.5)
WBC: 7.3 10*3/uL (ref 4.0–10.5)

## 2017-08-09 MED ORDER — OXYCODONE-ACETAMINOPHEN 5-325 MG PO TABS: 1.0000 | ORAL_TABLET | Freq: Four times a day (QID) | ORAL | 0 refills | Status: DC | PRN

## 2017-08-09 NOTE — Care Management Note (Signed)
Case Management Note Marvetta Gibbons RN, BSN Unit 4E-Case Manager [phone]  Patient Details  Name: [name] MRN: [id number] Date of Birth: 1956/02/01  Subjective/Objective:  Pt admitted s/p CEA                  Action/Plan: PTA pt lived at home- plan to return home, no CM needs noted for transition home.   Expected Discharge Date:  08/09/17               Expected Discharge Plan:  Home/Self Care  In-House Referral:  NA  Discharge planning Services  CM Consult  Post Acute Care Choice:  NA Choice offered to:  NA  DME Arranged:    DME Agency:     HH Arranged:    HH Agency:     Status of Service:  Completed, signed off  If discussed at [place] of Stay Meetings, dates discussed:    Discharge Disposition: home/self care   Additional Comments:  Dawayne Patricia, RN 08/09/2017, 11:18 AM

## 2017-08-09 NOTE — Progress Notes (Addendum)
  Progress Note    08/09/2017 7:14 AM 1 Day Post-Op  Subjective:  Denies stroke like symptoms including slurring speech, changes in vision, or one sided weakness.  No difficulty swallowing pills or while eating dinner last night.   Vitals:   08/09/17 0004 08/09/17 0403  BP: 110/63 115/64  Pulse: 70 71  Resp: 19 15  Temp: 97.6 F (36.4 C) 98 F (36.7 C)  SpO2: 98% 97%   Physical Exam: Cardiac:  RRR Lungs:  Non labored on RA Incisions:  L neck incision soft, no hematoma, no drainage Extremities:  Moving all extremities well Abdomen:  Soft Neurologic: CN grossly intact; baseline  CBC    Component Value Date/Time   WBC 7.3 08/09/2017 0353   RBC 3.54 (L) 08/09/2017 0353   HGB 11.0 (L) 08/09/2017 0353   HCT 34.0 (L) 08/09/2017 0353   PLT 173 08/09/2017 0353   MCV 96.0 08/09/2017 0353   MCH 31.1 08/09/2017 0353   MCHC 32.4 08/09/2017 0353   RDW 13.0 08/09/2017 0353   LYMPHSABS 1.2 05/23/2017 1402   MONOABS 1.7 (H) 05/23/2017 1402   EOSABS 0.0 05/23/2017 1402   BASOSABS 0.0 05/23/2017 1402    BMET    Component Value Date/Time   NA 136 08/09/2017 0353   K 3.8 08/09/2017 0353   CL 100 (L) 08/09/2017 0353   CO2 26 08/09/2017 0353   GLUCOSE 95 08/09/2017 0353   BUN 17 08/09/2017 0353   CREATININE 0.99 08/09/2017 0353   CALCIUM 8.6 (L) 08/09/2017 0353   GFRNONAA >60 08/09/2017 0353   GFRAA >60 08/09/2017 0353    INR    Component Value Date/Time   INR 1.04 08/04/2017 1439     Intake/Output Summary (Last 24 hours) at 08/09/2017 0714 Last data filed at 08/09/2017 0402 Gross per 24 hour  Intake 1680 ml  Output 2025 ml  Net -345 ml     Assessment/Plan:  62 y.o. male is s/p L CEA 1 Day Post-Op   Neuro intact D/c after ambulating and breakfast this morning Follow up in office in about 2 weeks   Dagoberto Ligas, PA-C [facility] [phone] 08/09/2017 7:14 AM  I have examined the patient, reviewed and agree with above.  Curt Jews, MD 08/09/2017 7:53 AM

## 2017-08-09 NOTE — Discharge Instructions (Signed)
   [facility]  Discharge Instructions   Carotid Endarterectomy (CEA)  Please refer to the following instructions for your post-procedure care. Your surgeon or physician assistant will discuss any changes with you.  Activity  You are encouraged to walk as much as you can. You can slowly return to normal activities but must avoid strenuous activity and heavy lifting until your doctor tell you it's OK. Avoid activities such as vacuuming or swinging a golf club. You can drive after one week if you are comfortable and you are no longer taking prescription pain medications. It is normal to feel tired for serval weeks after your surgery. It is also normal to have difficulty with sleep habits, eating, and bowel movements after surgery. These will go away with time.  Bathing/Showering  You may shower after you come home. Do not soak in a bathtub, hot tub, or swim until the incision heals completely.  Incision Care  Shower every day. Clean your incision with mild soap and water. Pat the area dry with a clean towel. You do not need a bandage unless otherwise instructed. Do not apply any ointments or creams to your incision. You may have skin glue on your incision. Do not peel it off. It will come off on its own in about one week. Your incision may feel thickened and raised for several weeks after your surgery. This is normal and the skin will soften over time. For Men Only: It's OK to shave around the incision but do not shave the incision itself for 2 weeks. It is common to have numbness under your chin that could last for several months.  Diet  Resume your normal diet. There are no special food restrictions following this procedure. A low fat/low cholesterol diet is recommended for all patients with vascular disease. In order to heal from your surgery, it is CRITICAL to get adequate nutrition. Your body requires vitamins, minerals, and protein. Vegetables are the best  source of vitamins and minerals. Vegetables also provide the perfect balance of protein. Processed food has little nutritional value, so try to avoid this.        Medications  Resume taking all of your medications unless your doctor or physician assistant tells you not to. If your incision is causing pain, you may take over-the- counter pain relievers such as acetaminophen (Tylenol). If you were prescribed a stronger pain medication, please be aware these medications can cause nausea and constipation. Prevent nausea by taking the medication with a snack or meal. Avoid constipation by drinking plenty of fluids and eating foods with a high amount of fiber, such as fruits, vegetables, and grains. Do not take Tylenol if you are taking prescription pain medications.  Follow Up  Our office will schedule a follow up appointment 2-3 weeks following discharge.  Please call us immediately for any of the following conditions  Increased pain, redness, drainage (pus) from your incision site. Fever of 101 degrees or higher. If you should develop stroke (slurred speech, difficulty swallowing, weakness on one side of your body, loss of vision) you should call 911 and go to the nearest emergency room.  Reduce your risk of vascular disease:  Stop smoking. If you would like help call [facility] at 1-800-QUIT-NOW (1-800-784-8669) or [facility] at 336-586-4000. Manage your cholesterol Maintain a desired weight Control your diabetes Keep your blood pressure down  If you have any questions, please call the office at [phone].   

## 2017-08-09 NOTE — Telephone Encounter (Signed)
-----   Message from Mena Goes, RN sent at 08/09/2017  9:22 AM EDT ----- Regarding: 2 weeks postop CEA   ----- Message ----- From: Iline Oven Sent: 08/09/2017   7:19 AM To: [facility] Charge Pool  Can you schedule an appt for this pt in about 2 weeks.  PO L CEA. Thanks, [facility]

## 2017-08-09 NOTE — Progress Notes (Signed)
Pt ambulated without difficulty > 500 ft

## 2017-08-09 NOTE — Telephone Encounter (Signed)
Sched appt 08/24/17 at 3:30. Lm on cell# to inform pt of appt.

## 2017-08-09 NOTE — Discharge Summary (Signed)
Discharge Summary     AMIERE [name] 01/29/1956 62 y.o. male  [id number]  Admission Date: 08/08/2017  Discharge Date: 08/09/17  Physician: Rosetta Posner, MD  Admission Diagnosis: left carotid stenosis  Discharge Day services:    see progress note 08/09/17 Physical Exam: Vitals:   08/09/17 0004 08/09/17 0403  BP: 110/63 115/64  Pulse: 70 71  Resp: 19 15  Temp: 97.6 F (36.4 C) 98 F (36.7 C)  SpO2: 98% 97%    Hospital Course:  The patient was admitted to the hospital and taken to the operating room on 08/08/2017 and underwent left carotid endarterectomy.  The pt tolerated the procedure well and was transported to the PACU in good condition.   By POD 1, the pt neuro status remained at baseline compared to pre-operative state  The remainder of the hospital course consisted of increasing mobilization and increasing intake of solids without difficulty.   Recent Labs    08/09/17 0353  NA 136  K 3.8  CL 100*  CO2 26  GLUCOSE 95  BUN 17  CALCIUM 8.6*   Recent Labs    08/09/17 0353  WBC 7.3  HGB 11.0*  HCT 34.0*  PLT 173   No results for input(s): INR in the last 72 hours.     Discharge Diagnosis:  left carotid stenosis  Secondary Diagnosis: Patient Active Problem List   Diagnosis Date Noted  . Carotid artery stenosis 08/08/2017  . Sepsis secondary to UTI ([place]) 12/12/2016  . Essential hypertension 12/12/2016  . Leukocytosis 12/12/2016  . Dyslipidemia 12/12/2016  . Acute renal failure (ARF) ([place]) 12/12/2016  . Anemia of chronic disease 12/12/2016  . History of TIA (transient ischemic attack) 11/11/2016  . Monoclonal gammopathy of unknown significance 02/07/2013  . Stenosis of carotid artery 09/12/2012  . Seizure disorder ([place]) 05/18/2010   Past Medical History:  Diagnosis Date  . AAA (abdominal aortic aneurysm) (HCC)    4.6 cm x 4. 5 cm per 05-30-17 [facility] ct referred to dr early to see april 2019  . Anemia   . Artery stenosis  (H[facility])    L carotid  . Arthritis    BACK  . Back pain   . Complication of anesthesia    WOKE UP DURING COLONSCOPY  . COPD (chronic obstructive pulmonary disease) ([place])   . CVA (cerebral infarction) 05/27/09   TIA, posterior circulation 7/11  . Depression   . Glaucoma    LEFT EYE WORSE  . Groin swelling   . Hemorrhage 1996   intracerebral  . History of kidney stones   . HLD (hyperlipidemia)   . HTN (hypertension)   . ICAO (internal carotid artery occlusion)    right ICA stenosis, s/p R carotid endarterectomy on 06/13/09  . Meningitis    viral; as a child   . Monoclonal gammopathy of unknown significance 02/07/2013   No anemia,no elevation of Iggs,  no m-spike, ?monoclonal protein on IFE 9/14  . PONV (postoperative nausea and vomiting)    NAUSEA  . Seizure disorder ([place])    at time of [place] in 1996. No seizures for quite awhile  . Seizures ([place])     5 years ago  . Stroke [facility]) March 2011    Allergies as of 08/09/2017      Reactions   Tape Rash   ADHESIVE      Medication List    TAKE these medications   aspirin 325 MG tablet Take 325 mg by mouth daily. Will start  08/06/17   budesonide-formoterol 80-4.5 MCG/ACT inhaler Commonly known as:  SYMBICORT Inhale 2 puffs into the lungs 2 (two) times daily as needed (for wheezing).   dipyridamole-aspirin 200-25 MG 12hr capsule Commonly known as:  AGGRENOX Take 1 capsule by mouth 2 (two) times daily. What changed:  when to take this   ferrous sulfate 325 (65 FE) MG tablet Take 325 mg by mouth daily with breakfast.   oxyCODONE-acetaminophen 5-325 MG tablet Commonly known as:  PERCOCET/ROXICET Take 1-2 tablets by mouth every 6 (six) hours as needed for moderate pain.   rosuvastatin 20 MG tablet Commonly known as:  CRESTOR Take 20 mg by mouth daily.   sertraline 50 MG tablet Commonly known as:  ZOLOFT Take 50 mg by mouth every morning.   tamsulosin 0.4 MG Caps capsule Commonly known as:  FLOMAX Take 0.4 mg by mouth  daily after supper.   TEGRETOL-XR 400 MG 12 hr tablet Generic drug:  carbamazepine Take 1 tablet (400 mg total) by mouth 4 (four) times daily.   valsartan-hydrochlorothiazide 160-25 MG tablet Commonly known as:  DIOVAN-HCT Take 0.5 tablets by mouth at bedtime.   Vitamin D 2000 units tablet Take 2,000 Units by mouth daily.        Discharge Instructions:   [facility] Discharge Instructions Carotid Endarterectomy (CEA)  Please refer to the following instructions for your post-procedure care. Your surgeon or physician assistant will discuss any changes with you.  Activity  You are encouraged to walk as much as you can. You can slowly return to normal activities but must avoid strenuous activity and heavy lifting until your doctor tell you it's OK. Avoid activities such as vacuuming or swinging a golf club. You can drive after one week if you are comfortable and you are no longer taking prescription pain medications. It is normal to feel tired for serval weeks after your surgery. It is also normal to have difficulty with sleep habits, eating, and bowel movements after surgery. These will go away with time.  Bathing/Showering  You may shower after you come home. Do not soak in a bathtub, hot tub, or swim until the incision heals completely.  Incision Care  Shower every day. Clean your incision with mild soap and water. Pat the area dry with a clean towel. You do not need a bandage unless otherwise instructed. Do not apply any ointments or creams to your incision. You may have skin glue on your incision. Do not peel it off. It will come off on its own in about one week. Your incision may feel thickened and raised for several weeks after your surgery. This is normal and the skin will soften over time. For Men Only: It's OK to shave around the incision but do not shave the incision itself for 2 weeks. It is common to have numbness under your chin that could  last for several months.  Diet  Resume your normal diet. There are no special food restrictions following this procedure. A low fat/low cholesterol diet is recommended for all patients with vascular disease. In order to heal from your surgery, it is CRITICAL to get adequate nutrition. Your body requires vitamins, minerals, and protein. Vegetables are the best source of vitamins and minerals. Vegetables also provide the perfect balance of protein. Processed food has little nutritional value, so try to avoid this.  Medications  Resume taking all of your medications unless your doctor or physician assistant tells you not to.  If your incision  is causing pain, you may take over-the- counter pain relievers such as acetaminophen (Tylenol). If you were prescribed a stronger pain medication, please be aware these medications can cause nausea and constipation.  Prevent nausea by taking the medication with a snack or meal. Avoid constipation by drinking plenty of fluids and eating foods with a high amount of fiber, such as fruits, vegetables, and grains. Do not take Tylenol if you are taking prescription pain medications.  Follow Up  Our office will schedule a follow up appointment 2-3 weeks following discharge.  Please call us immediately for any of the following conditions  Increased pain, redness, drainage (pus) from your incision site. Fever of 101 degrees or higher. If you should develop stroke (slurred speech, difficulty swallowing, weakness on one side of your body, loss of vision) you should call 911 and go to the nearest emergency room.  Reduce your risk of vascular disease:  Stop smoking. If you would like help call [facility] at 1-800-QUIT-NOW 838-727-3429) or [place] at (830)434-2613. Manage your cholesterol Maintain a desired weight Control your diabetes Keep your blood pressure down  If you have any questions, please call the office at [phone].  Disposition:  home  Patient's condition: is Good  Follow up: 1. Dr. Donnetta Hutching in 2 weeks.   Dagoberto Ligas, PA-C [facility] [phone]   --- For [facility] use ---   Modified Rankin score at D/C (0-6): 0  IV medication needed for:  1. Hypertension: No 2. Hypotension: No  Post-op Complications: No  1. Post-op CVA or TIA: No  If yes: Event classification (right eye, left eye, right cortical, left cortical, verterobasilar, other):   If yes: Timing of event (intra-op, <6 hrs post-op, >=6 hrs post-op, unknown):   2. CN injury: No  If yes: CN \ injuried   3. Myocardial infarction: No  If yes: Dx by (EKG or clinical, Troponin):   4.  CHF: No  5.  Dysrhythmia (new): No  6. Wound infection: No  7. Reperfusion symptoms: No  8. Return to OR: No  If yes: return to OR for (bleeding, neurologic, other CEA incision, other):   Discharge medications: Statin use:  Yes ASA use:  Yes   Beta blocker use:  No ACE-Inhibitor use:  No  ARB use:  Yes CCB use: No P2Y12 Antagonist use: No, [ ]  Plavix, [ ]  Plasugrel, [ ]  Ticlopinine, [ ]  Ticagrelor, [ ]  Other, [ ]  No for medical reason, [ ]  Non-compliant, [ ]  Not-indicated Anti-coagulant use:  No, [ ]  Warfarin, [ ]  Rivaroxaban, [ ]  Dabigatran,

## 2017-08-16 ENCOUNTER — Telehealth: Payer: Self-pay | Admitting: Vascular Surgery

## 2017-08-16 NOTE — Telephone Encounter (Signed)
-----   Message from Penni Homans, RN sent at 08/15/2017  3:33 PM EDT ----- Please move this patient. I confirmed this with Dr. Oneida Alar. Anyone that is major surgery post-op needs to see the physician. Sorry for the confusion. This process is still a work in progress. Thanks! ----- Message ----- From: Gabriel Earing, PA-C Sent: 08/11/2017   5:20 PM To: [facility] Charge Pool  This pt is scheduled on the PA schedule.  He just had a CEA done 3 days ago.  He needs to be on Dr. Luther Parody schedule.  Dr. Donnetta Hutching has only 21 pts the Tuesday before this pt is on the PA schedule.  Please change to his schedule.  Thanks

## 2017-08-16 NOTE — Telephone Encounter (Signed)
Resched appt to 08/23/17 at 9:45. Lm on cell# to inform pt of resched appt.

## 2017-08-23 ENCOUNTER — Other Ambulatory Visit: Payer: Self-pay

## 2017-08-23 ENCOUNTER — Ambulatory Visit (INDEPENDENT_AMBULATORY_CARE_PROVIDER_SITE_OTHER): Payer: Self-pay | Admitting: Vascular Surgery

## 2017-08-23 ENCOUNTER — Encounter: Payer: Self-pay | Admitting: Vascular Surgery

## 2017-08-23 VITALS — BP 136/90 | HR 78 | Temp 97.0°F | Resp 16 | Ht 74.0 in | Wt 176.0 lb

## 2017-08-23 DIAGNOSIS — I6522 Occlusion and stenosis of left carotid artery: Secondary | ICD-10-CM

## 2017-08-23 NOTE — Progress Notes (Signed)
   Patient name: [name] MRN: [id number] DOB: 1955-07-09 Sex: male  REASON FOR VISIT: Follow-up left carotid endarterectomy for severe asymptomatic carotid disease  HPI: [name] is a 62 y.o. male here today for follow-up.  Underwent left carotid endarterectomy on 08/08/2017.  He was discharged home on postoperative day #1 and has had no difficulty.  He does have the typical peri-incisional numbness.  He has a remote history of right carotid endarterectomy  Current Outpatient Medications  Medication Sig Dispense Refill  . aspirin 325 MG tablet Take 325 mg by mouth daily. Will start 08/06/17    . budesonide-formoterol (SYMBICORT) 80-4.5 MCG/ACT inhaler Inhale 2 puffs into the lungs 2 (two) times daily as needed (for wheezing).     . Cholecalciferol (VITAMIN D) 2000 units tablet Take 2,000 Units by mouth daily.     Marland Kitchen dipyridamole-aspirin (AGGRENOX) 200-25 MG 12hr capsule Take 1 capsule by mouth 2 (two) times daily. (Patient taking differently: Take 1 capsule by mouth daily. ) 180 capsule 3  . ferrous sulfate 325 (65 FE) MG tablet Take 325 mg by mouth daily with breakfast.    . rosuvastatin (CRESTOR) 20 MG tablet Take 20 mg by mouth daily.    . sertraline (ZOLOFT) 50 MG tablet Take 50 mg by mouth every morning.     . tamsulosin (FLOMAX) 0.4 MG CAPS capsule Take 0.4 mg by mouth daily after supper.    . TEGRETOL-XR 400 MG 12 hr tablet Take 1 tablet (400 mg total) by mouth 4 (four) times daily. 360 tablet 3  . valsartan-hydrochlorothiazide (DIOVAN-HCT) 160-25 MG per tablet Take 0.5 tablets by mouth at bedtime.     Marland Kitchen oxyCODONE-acetaminophen (PERCOCET/ROXICET) 5-325 MG tablet Take 1-2 tablets by mouth every 6 (six) hours as needed for moderate pain. (Patient not taking: Reported on 08/23/2017) 8 tablet 0   No current facility-administered medications for this visit.      PHYSICAL EXAM: Vitals:   08/23/17 0950 08/23/17 0954  BP: 127/77 136/90  Pulse:  78   Resp: 16   Temp: (!) 97 F (36.1 C)   TempSrc: Oral   SpO2: 98%   Weight: 176 lb (79.8 kg)   Height: 6\' 2"  (1.88 m)     GENERAL: The patient is a well-nourished male, in no acute distress. The vital signs are documented above. Left carotid incision is healed quite nicely.  He does have a right carotid bruit.  Prior duplex on 07/22/2017 showed no evidence of internal carotid stenosis on the right  MEDICAL ISSUES: Stable following carotid surgery.  He does do strenuous activity and will need to not be at work for an additional 3 weeks.  He should be able to return to work with no limitations 3 weeks from now which will be 6 weeks out from surgery.  We will see him again in 9 months with repeat carotid duplex   Rosetta Posner, MD [facility] Tel [phone] Pager [phone]

## 2017-08-24 ENCOUNTER — Encounter (INDEPENDENT_AMBULATORY_CARE_PROVIDER_SITE_OTHER): Payer: 59

## 2017-08-24 DIAGNOSIS — Z0279 Encounter for issue of other medical certificate: Secondary | ICD-10-CM

## 2017-09-19 DIAGNOSIS — R350 Frequency of micturition: Secondary | ICD-10-CM | POA: Diagnosis not present

## 2017-09-19 DIAGNOSIS — N201 Calculus of ureter: Secondary | ICD-10-CM | POA: Diagnosis not present

## 2017-09-19 DIAGNOSIS — N401 Enlarged prostate with lower urinary tract symptoms: Secondary | ICD-10-CM | POA: Diagnosis not present

## 2017-09-26 DIAGNOSIS — K645 Perianal venous thrombosis: Secondary | ICD-10-CM | POA: Diagnosis not present

## 2017-09-27 DIAGNOSIS — K645 Perianal venous thrombosis: Secondary | ICD-10-CM | POA: Diagnosis not present

## 2017-10-20 ENCOUNTER — Other Ambulatory Visit: Payer: Self-pay

## 2017-10-20 DIAGNOSIS — R0989 Other specified symptoms and signs involving the circulatory and respiratory systems: Secondary | ICD-10-CM

## 2017-10-20 DIAGNOSIS — I6522 Occlusion and stenosis of left carotid artery: Secondary | ICD-10-CM

## 2017-11-09 NOTE — Progress Notes (Signed)
[facility]  PATIENT: [name] DOB: Feb 06, 1956   REASON FOR VISIT: Follow-up for seizure disorder, history of stroke , risk factors of smoking hypertension hyperlipidemia HISTORY FROM: Patient    HISTORY OF PRESENT ILLNESS:UPDATE 8/2/2019CM [name], 62 year old male returns for follow-up with history of seizure disorder currently well controlled on Tegretol without side effects and no seizures in several years.  He also has a history of posterior circulatory TIA in September 2011 and remote brain intracranial hemorrhage in 1996.  He has risk factors of hypertension hyperlipidemia smoking, carotid artery disease.  He continues to work full-time he remains on Aggrenox for secondary stroke prevention without bruising or bleeding he has not had further stroke or TIA symptoms.  He is also on Crestor without myalgias.  Most recent carotid Doppler done 07/22/2017 with severe stenosis involving the proximal left internal carotid artery measuring greater than 70%.  Extensive atherosclerotic disease in bilateral carotid arteries estimated degree of stenosis in the right is less than 50%..  Patent vertebral arteries with antegrade flow.  He had left carotid endarterectomy on 08/08/2017.  He also had CT of the abdomen 05/20/2017 which demonstrated 4.6 cm aortic aneurysm.  He is followed by Dr. Donnetta Hutching.  Blood pressure in the office today 144/80.  Patient claims he has not taken his blood pressure medicines this morning.  He returns for reevaluation.  UPDATE 11/11/16 CM [name], 62 year old male returns for yearly follow-up. He has a history of seizure disorder with no seizures in several years. He is currently maintained on Tegretol and he claims recent labs were done at Dr. Inda Merlin office. He also has a history of posterior circulatory TIA in September 2011, and remote brain intracranial hemorrhage in 1996. He has risk factors of smoking hypertension hyperlipidemia. 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 He is currently on Aggrenox for secondary stroke prevention without bruising or bleeding. He has not had further stroke or TIA symptoms He is on Crestor without complaints of myalgias. Blood pressure in the office today 139/81. He returns for reevaluation   UPDATE 8/10/17CMMr. [name], 61 year old male returns for yearly followup.  .He has a history of posterior circulatory TIA in September of 2011 with vascular risk factors of smoking, hypertension, hyperlipidemia, and remote brain intracranial hemorrhage in 1996. He also has had a seizure disorder since 1978 following encephalitis with breakthrough seizures occurring following a brain hemorrhage in 1996 currently well controlled on Tegretol. In addition he has history of right subcortical infarct in February of 2011 right carotid endarterectomy in March of 2011 He was initially started on Plavix but had diarrhea on the drug. He was on Aggrenox  however stopped the drug due to the expense of greater than $200 a month. He was then  on aspirin now  back on Aggrenox as he now has insurance He denies further stroke or TIA symptoms since last seen. Carotid duplex with Dr. Kellie Simmering 09/18/2013 with progression left ICA  stenosis.  Dopplers followed by CVTS. He works full-time as an Clinical biochemist He returns for reevaluation .    REVIEW OF SYSTEMS: Full 14 system review of systems performed and notable only for those listed, all others are neg:  Constitutional: neg  Cardiovascular: neg Ear/Nose/Throat: neg  Skin: neg Eyes: neg Respiratory: neg Gastroitestinal: neg  Hematology/Lymphatic: neg  Endocrine: neg Musculoskeletal:neg Allergy/Immunology: neg Neurological: History of seizure disorder and stroke Psychiatric: neg Sleep : neg   ALLERGIES: Allergies  Allergen Reactions  . Tape Rash  ADHESIVE    HOME MEDICATIONS: Outpatient Medications Prior to Visit  Medication Sig Dispense Refill  . aspirin 325 MG tablet  Take 325 mg by mouth daily. Will start 08/06/17    . budesonide-formoterol (SYMBICORT) 80-4.5 MCG/ACT inhaler Inhale 2 puffs into the lungs 2 (two) times daily as needed (for wheezing).     . Cholecalciferol (VITAMIN D) 2000 units tablet Take 2,000 Units by mouth daily.     Marland Kitchen dipyridamole-aspirin (AGGRENOX) 200-25 MG 12hr capsule Take 1 capsule by mouth 2 (two) times daily. (Patient taking differently: Take 1 capsule by mouth daily. ) 180 capsule 3  . ferrous sulfate 325 (65 FE) MG tablet Take 325 mg by mouth daily with breakfast.    . rosuvastatin (CRESTOR) 20 MG tablet Take 20 mg by mouth daily.    . sertraline (ZOLOFT) 50 MG tablet Take 50 mg by mouth every morning.     . TEGRETOL-XR 400 MG 12 hr tablet Take 1 tablet (400 mg total) by mouth 4 (four) times daily. 360 tablet 3  . valsartan-hydrochlorothiazide (DIOVAN-HCT) 160-25 MG per tablet Take 0.5 tablets by mouth at bedtime.     Marland Kitchen oxyCODONE-acetaminophen (PERCOCET/ROXICET) 5-325 MG tablet Take 1-2 tablets by mouth every 6 (six) hours as needed for moderate pain. (Patient not taking: Reported on 08/23/2017) 8 tablet 0  . tamsulosin (FLOMAX) 0.4 MG CAPS capsule Take 0.4 mg by mouth daily after supper.     No facility-administered medications prior to visit.     PAST MEDICAL HISTORY: Past Medical History:  Diagnosis Date  . AAA (abdominal aortic aneurysm) (HCC)    4.6 cm x 4. 5 cm per 05-30-17 [facility] ct referred to dr early to see april 2019  . Anemia   . Artery stenosis (HCC)    L carotid  . Arthritis    BACK  . Back pain   . Complication of anesthesia    WOKE UP DURING COLONSCOPY  . COPD (chronic obstructive pulmonary disease) ([place])   . CVA (cerebral infarction) 05/27/09   TIA, posterior circulation 7/11  . Depression   . Glaucoma    LEFT EYE WORSE  . Groin swelling   . Hemorrhage 1996   intracerebral  . History of kidney stones   . HLD (hyperlipidemia)   . HTN (hypertension)   . ICAO (internal carotid artery  occlusion)    right ICA stenosis, s/p R carotid endarterectomy on 06/13/09  . Meningitis    viral; as a child   . Monoclonal gammopathy of unknown significance 02/07/2013   No anemia,no elevation of Iggs,  no m-spike, ?monoclonal protein on IFE 9/14  . PONV (postoperative nausea and vomiting)    NAUSEA  . Seizure disorder ([place])    at time of [place] in 1996. No seizures for quite awhile  . Seizures ([place])     5 years ago  . Stroke [facility]) March 2011    PAST SURGICAL HISTORY: Past Surgical History:  Procedure Laterality Date  . CAROTID ENDARTERECTOMY Right 06/12/09   cea  . COLONOSCOPY  06/16/01   by Dr. Earle Gell. positive for colonic polyp, non-adenomatous   . COLONOSCOPY WITH PROPOFOL N/A 02/03/2015   Procedure: COLONOSCOPY WITH PROPOFOL;  Surgeon: Garlan Fair, MD;  Location: [facility];  Service: Endoscopy;  Laterality: N/A;  . CYSTOSCOPY/URETEROSCOPY/HOLMIUM LASER/STENT PLACEMENT Left 06/15/2017   Procedure: CYSTOSCOPY/RETROGRADE/URETEROSCOPY/ /STENT PLACEMENT;  Surgeon: Ceasar Mons, MD;  Location: [facility];  Service: Urology;  Laterality: Left;  . ENDARTERECTOMY Left  08/08/2017   Procedure: ENDARTERECTOMY CAROTID LEFT;  Surgeon: Rosetta Posner, MD;  Location: [facility];  Service: Vascular;  Laterality: Left;  . GLAUCOMA REPAIR  2016   by taking out cataracts (bilateral)  . PATCH ANGIOPLASTY Left 08/08/2017   Procedure: PATCH ANGIOPLASTY CAROTID LEFT USING HEMASHIELD PATCH;  Surgeon: Rosetta Posner, MD;  Location: [facility];  Service: Vascular;  Laterality: Left;  . R CEA  3/11  . R inguinal hernia repair  07/05/09    FAMILY HISTORY: Family History  Problem Relation Age of Onset  . Heart disease Father        Heart Disease before age 39  . Heart attack Father   . Heart disease Mother   . Hyperlipidemia Brother   . Hyperlipidemia Sister   . Heart attack Sister        in younger sister    SOCIAL HISTORY: Social History   Socioeconomic History  .  Marital status: Married    Spouse name: Not on file  . Number of children: 2  . Years of education: 12+  . Highest education level: Not on file  Occupational History  . Occupation: ELECTRICIAN    Employer: Location manager  Social Needs  . Financial resource strain: Not on file  . Food insecurity:    Worry: Not on file    Inability: Not on file  . Transportation needs:    Medical: Not on file    Non-medical: Not on file  Tobacco Use  . Smoking status: Current Every Day Smoker    Packs/day: 1.00    Types: Cigarettes  . Smokeless tobacco: Current User    Types: Chew  Substance and Sexual Activity  . 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 Drug use: Not Currently    Types: Marijuana    Comment: MARIJUANA 30 YRS AGO  . Sexual activity: Not on file  Lifestyle  . Physical activity:    Days per week: Not on file    Minutes per session: Not on file  . Stress: Not on file  Relationships  . Social connections:    Talks on phone: Not on file    Gets together: Not on file    Attends religious service: Not on file    Active member of club or organization: Not on file    Attends meetings of clubs or organizations: Not on file    Relationship status: Not on file  . Intimate partner violence:    Fear of current or ex partner: Not on file    Emotionally abused: Not on file    Physically abused: Not on file    Forced sexual activity: Not on file  Other Topics Concern  . Not on file  Social History Narrative   Married, lives in [place] with his wife.   2 children (daughter, Janett Billow, is a Marine scientist on [employer] at [employer])    Patient is right handed   Patient has a high school education with some college.   Patient drinks at least 5 cups daily.           PHYSICAL EXAM  Vitals:   11/11/17 0802  BP: (!) 144/80  Pulse: 76  Weight: 184 lb 12.8 oz (83.8 kg)  Height: 6\' 2"  (1.88 m)   Body mass index is  23.73 kg/m. General: well developed, well nourished, seated, in no evident  distress  Head: head normocephalic and atraumatic. Oropharynx benign  Neck: supple with right carotid bruit. Left  CEA scar.  Cardiovascular: regular rate and rhythm, no murmurs  Neurologic Exam  Mental Status: Awake and fully alert. Oriented to place and time. Follows all commands. Speech and language normal.  Cranial Nerves: Pupils equal, briskly reactive to light. Extraocular movements full without nystagmus. Visual fields full to confrontation. Hearing intact and symmetric to finger snap. Facial sensation intact. Face, tongue, palate move normally and symmetrically. Neck flexion and extension normal.  Motor: Normal bulk and tone. Normal strength in all tested extremity muscles.No focal weakness  Sensory.: intact to touch and pinprick and vibratory.  Coordination: Rapid alternating movements normal in all extremities. Finger-to-nose and heel-to-shin performed accurately bilaterally.  Gait and Station: Arises from chair without difficulty. Stance is normal. Gait demonstrates normal stride length and balance . Able to heel, toe and tandem walk without difficulty.  Reflexes: 2+ and symmetric. Toes downgoing.  DIAGNOSTIC DATA (LABS, IMAGING, TESTING)  ASSESSMENT AND PLAN  62 y.o. year old male  has a past medical history of Hemorrhage (1996); CVA (cerebral infarction) (05/27/09); HTN (hypertension); HLD (hyperlipidemia); ICAO (internal carotid artery occlusion); Artery stenosis; Seizure disorder; Meningitis; Stroke (March 2011); Myocardial infarction (March 2011); and Monoclonal gammopathy of unknown significance (02/07/2013). here to follow-up. He has not had stroke or TIA symptoms. He has not had any seizure activity in several years.  Most recent carotid Doppler done 07/22/2017 with severe stenosis involving the proximal left internal carotid artery measuring greater than 70%.  Extensive atherosclerotic disease  in bilateral carotid arteries estimated degree of stenosis in the right is less than 50%..  Patent vertebral arteries with antegrade flow.  He had left carotid endarterectomy on 08/08/2017. .The patient is a current patient of Dr. Leonie Man who is out of the office today . This note is sent to the work in doctor.      PLAN: Continue Agggrenox at current dose will refill Keep LDL below 100 followed by PCP Keep Systolic blood pressure less than 130, today's reading 144/80 Continue Tegretol at current dose will refill Call for any seizure activity Carotid Dopplers followed by Dr. Donnetta Hutching Try to Stop smoking as this is an increased risk factor for stroke Reviewed recent hospital labs CBC and CMP from 08/04/17  Follow-up in one year Dennie Bible, [facility], [facility], [facility] [address] [place], [place] [address] [phone]

## 2017-11-11 ENCOUNTER — Ambulatory Visit: Payer: 59 | Admitting: Nurse Practitioner

## 2017-11-11 ENCOUNTER — Encounter: Payer: Self-pay | Admitting: Nurse Practitioner

## 2017-11-11 VITALS — BP 144/80 | HR 76 | Ht 74.0 in | Wt 184.8 lb

## 2017-11-11 DIAGNOSIS — Z8673 Personal history of transient ischemic attack (TIA), and cerebral infarction without residual deficits: Secondary | ICD-10-CM | POA: Diagnosis not present

## 2017-11-11 DIAGNOSIS — I6521 Occlusion and stenosis of right carotid artery: Secondary | ICD-10-CM

## 2017-11-11 DIAGNOSIS — G40909 Epilepsy, unspecified, not intractable, without status epilepticus: Secondary | ICD-10-CM

## 2017-11-11 DIAGNOSIS — I10 Essential (primary) hypertension: Secondary | ICD-10-CM

## 2017-11-11 DIAGNOSIS — E785 Hyperlipidemia, unspecified: Secondary | ICD-10-CM | POA: Diagnosis not present

## 2017-11-11 MED ORDER — TEGRETOL-XR 400 MG PO TB12: 400.0000 mg | ORAL_TABLET | Freq: Four times a day (QID) | ORAL | 3 refills | Status: DC

## 2017-11-11 MED ORDER — ASPIRIN-DIPYRIDAMOLE ER 25-200 MG PO CP12: 1.0000 | ORAL_CAPSULE | Freq: Two times a day (BID) | ORAL | 3 refills | Status: DC

## 2017-11-11 NOTE — Patient Instructions (Signed)
Continue Agggrenox at current dose will refill Keep LDL below 100 followed by PCP Keep Systolic blood pressure less than 130, today's reading 144/80 Continue Tegretol at current dose will refill Call for any seizure activity Carotid Dopplers followed by Dr. Donnetta Hutching Try to Stop smoking as this is an increased risk factor for stroke Reviewed recent hospital labs Follow-up in one year

## 2018-02-25 DIAGNOSIS — Z23 Encounter for immunization: Secondary | ICD-10-CM | POA: Diagnosis not present

## 2018-03-03 DIAGNOSIS — R1310 Dysphagia, unspecified: Secondary | ICD-10-CM | POA: Diagnosis not present

## 2018-03-03 DIAGNOSIS — D649 Anemia, unspecified: Secondary | ICD-10-CM | POA: Diagnosis not present

## 2018-04-25 ENCOUNTER — Other Ambulatory Visit: Payer: Self-pay

## 2018-05-09 ENCOUNTER — Ambulatory Visit
Admission: RE | Admit: 2018-05-09 | Discharge: 2018-05-09 | Disposition: A | Discharge status: Home / Self Care | Payer: 59 | Source: Ambulatory Visit | Attending: Vascular Surgery | Admitting: Vascular Surgery

## 2018-05-09 DIAGNOSIS — I6522 Occlusion and stenosis of left carotid artery: Secondary | ICD-10-CM

## 2018-05-09 DIAGNOSIS — R0989 Other specified symptoms and signs involving the circulatory and respiratory systems: Secondary | ICD-10-CM

## 2018-05-16 ENCOUNTER — Ambulatory Visit: Payer: 59 | Admitting: Vascular Surgery

## 2018-05-16 ENCOUNTER — Encounter (HOSPITAL_COMMUNITY): Payer: 59

## 2018-05-18 ENCOUNTER — Emergency Department (HOSPITAL_BASED_OUTPATIENT_CLINIC_OR_DEPARTMENT_OTHER)
Admission: EM | Admit: 2018-05-18 | Discharge: 2018-05-18 | Disposition: A | Discharge status: Home / Self Care | Payer: 59 | Attending: Emergency Medicine | Admitting: Emergency Medicine

## 2018-05-18 ENCOUNTER — Emergency Department (HOSPITAL_BASED_OUTPATIENT_CLINIC_OR_DEPARTMENT_OTHER): Payer: 59

## 2018-05-18 ENCOUNTER — Encounter (HOSPITAL_BASED_OUTPATIENT_CLINIC_OR_DEPARTMENT_OTHER): Payer: Self-pay | Admitting: *Deleted

## 2018-05-18 DIAGNOSIS — Z8673 Personal history of transient ischemic attack (TIA), and cerebral infarction without residual deficits: Secondary | ICD-10-CM | POA: Diagnosis not present

## 2018-05-18 DIAGNOSIS — E876 Hypokalemia: Secondary | ICD-10-CM | POA: Diagnosis not present

## 2018-05-18 DIAGNOSIS — I10 Essential (primary) hypertension: Secondary | ICD-10-CM | POA: Insufficient documentation

## 2018-05-18 DIAGNOSIS — Z79899 Other long term (current) drug therapy: Secondary | ICD-10-CM | POA: Diagnosis not present

## 2018-05-18 DIAGNOSIS — Z955 Presence of coronary angioplasty implant and graft: Secondary | ICD-10-CM | POA: Diagnosis not present

## 2018-05-18 DIAGNOSIS — R42 Dizziness and giddiness: Secondary | ICD-10-CM

## 2018-05-18 DIAGNOSIS — Z7982 Long term (current) use of aspirin: Secondary | ICD-10-CM | POA: Insufficient documentation

## 2018-05-18 DIAGNOSIS — F17210 Nicotine dependence, cigarettes, uncomplicated: Secondary | ICD-10-CM | POA: Insufficient documentation

## 2018-05-18 DIAGNOSIS — E871 Hypo-osmolality and hyponatremia: Secondary | ICD-10-CM | POA: Diagnosis not present

## 2018-05-18 LAB — COMPREHENSIVE METABOLIC PANEL
ALBUMIN: 3.7 g/dL (ref 3.5–5.0)
ALT: 20 U/L (ref 0–44)
ANION GAP: 11 (ref 5–15)
AST: 21 U/L (ref 15–41)
Alkaline Phosphatase: 94 U/L (ref 38–126)
BUN: 18 mg/dL (ref 8–23)
CHLORIDE: 82 mmol/L — AB (ref 98–111)
CO2: 27 mmol/L (ref 22–32)
Calcium: 8.6 mg/dL — ABNORMAL LOW (ref 8.9–10.3)
Creatinine, Ser: 1.24 mg/dL (ref 0.61–1.24)
GFR calc Af Amer: >60 mL/min (ref >60)
GFR calc non Af Amer: >60 mL/min (ref >60)
GLUCOSE: 121 mg/dL — AB (ref 70–99)
POTASSIUM: 2.6 mmol/L — AB (ref 3.5–5.1)
SODIUM: 120 mmol/L — AB (ref 135–145)
Total Bilirubin: 0.4 mg/dL (ref 0.3–1.2)
Total Protein: 7 g/dL (ref 6.5–8.1)

## 2018-05-18 LAB — URINALYSIS, ROUTINE W REFLEX MICROSCOPIC
Bilirubin Urine: NEGATIVE
GLUCOSE, UA: NEGATIVE mg/dL
Ketones, ur: NEGATIVE mg/dL
Leukocytes, UA: NEGATIVE
Nitrite: NEGATIVE
Protein, ur: NEGATIVE mg/dL
Specific Gravity, Urine: 1.01 (ref 1.005–1.030)
pH: 7 (ref 5.0–8.0)

## 2018-05-18 LAB — RAPID URINE DRUG SCREEN, HOSP PERFORMED
Amphetamines: NOT DETECTED
BARBITURATES: NOT DETECTED
Benzodiazepines: POSITIVE — AB
Cocaine: NOT DETECTED
OPIATES: NOT DETECTED
Tetrahydrocannabinol: NOT DETECTED

## 2018-05-18 LAB — ETHANOL: Alcohol, Ethyl (B): <10 mg/dL (ref <10)

## 2018-05-18 LAB — CBC WITH DIFFERENTIAL/PLATELET
Abs Immature Granulocytes: 0.02 10*3/uL (ref 0.00–0.07)
BASOS ABS: 0 10*3/uL (ref 0.0–0.1)
BASOS PCT: 0 %
EOS ABS: 0.1 10*3/uL (ref 0.0–0.5)
Eosinophils Relative: 1 %
HCT: 34.5 % — ABNORMAL LOW (ref 39.0–52.0)
Hemoglobin: 12.2 g/dL — ABNORMAL LOW (ref 13.0–17.0)
IMMATURE GRANULOCYTES: 0 %
LYMPHS PCT: 19 %
Lymphs Abs: 1.2 10*3/uL (ref 0.7–4.0)
MCH: 32.1 pg (ref 26.0–34.0)
MCHC: 35.4 g/dL (ref 30.0–36.0)
MCV: 90.8 fL (ref 80.0–100.0)
MONOS PCT: 8 %
Monocytes Absolute: 0.5 10*3/uL (ref 0.1–1.0)
NEUTROS ABS: 4.7 10*3/uL (ref 1.7–7.7)
NEUTROS PCT: 72 %
PLATELETS: 242 10*3/uL (ref 150–400)
RBC: 3.8 MIL/uL — AB (ref 4.22–5.81)
RDW: 11.3 % — AB (ref 11.5–15.5)
WBC: 6.5 10*3/uL (ref 4.0–10.5)
nRBC: 0 % (ref 0.0–0.2)

## 2018-05-18 LAB — URINALYSIS, MICROSCOPIC (REFLEX): WBC, UA: NONE SEEN WBC/hpf (ref 0–5)

## 2018-05-18 LAB — PROTIME-INR
INR: 1.02
PROTHROMBIN TIME: 13.3 s (ref 11.4–15.2)

## 2018-05-18 LAB — TROPONIN I: Troponin I: <0.03 ng/mL (ref <0.03)

## 2018-05-18 LAB — APTT: APTT: 36 s (ref 24–36)

## 2018-05-18 LAB — LIPASE, BLOOD: Lipase: 27 U/L (ref 11–51)

## 2018-05-18 MED ORDER — SODIUM CHLORIDE 0.9 % IV SOLN
INTRAVENOUS | Status: DC
  Administered 2018-05-18: 20:00:00 via INTRAVENOUS

## 2018-05-18 MED ORDER — SODIUM CHLORIDE 0.9 % IV BOLUS
500.0000 mL | Freq: Once | INTRAVENOUS | Status: AC
  Administered 2018-05-18: 500 mL via INTRAVENOUS

## 2018-05-18 MED ORDER — IOPAMIDOL (ISOVUE-370) INJECTION 76%
100.0000 mL | Freq: Once | INTRAVENOUS | Status: AC | PRN
  Administered 2018-05-18: 100 mL via INTRAVENOUS

## 2018-05-18 MED ORDER — POTASSIUM CHLORIDE CRYS ER 20 MEQ PO TBCR: 20.0000 meq | EXTENDED_RELEASE_TABLET | Freq: Every day | ORAL | 0 refills | Status: DC

## 2018-05-18 MED ORDER — POTASSIUM CHLORIDE CRYS ER 20 MEQ PO TBCR
60.0000 meq | EXTENDED_RELEASE_TABLET | Freq: Once | ORAL | Status: AC
  Administered 2018-05-18: 60 meq via ORAL
  Filled 2018-05-18: qty 3

## 2018-05-18 NOTE — ED Notes (Signed)
Pt unable to provided urine sample at this time.

## 2018-05-18 NOTE — ED Triage Notes (Addendum)
Dizziness x 2 weeks. He was seen by his MD for same and had carotid doppler studies. He does not have the results. Dizziness has continued. States he stopped drinking alcohol 2 weeks ago.

## 2018-05-18 NOTE — ED Notes (Signed)
Date and time results received: 05/18/18 1805   Test:potassium Critical Value: 2.6 Name of Provider Notified:Josh PA and [facility]

## 2018-05-18 NOTE — ED Provider Notes (Signed)
[facility] EMERGENCY DEPARTMENT Provider Note   CSN: [id number] Arrival date & time: 05/18/18  1716     History   Chief Complaint Chief Complaint  Patient presents with  . Dizziness    HPI [name] is a 63 y.o. male.  Patient with history of posterior circulation stroke, previous hemorrhagic stroke in his 30s, previous left carotid endarterectomy, recent carotid ultrasound showing progression of right carotid disease --presents for approximately 2-week history of dizziness.  Patient describes symptoms of being constant and being lightheaded as well as feeling off balance with walking.  He states that he is stumbled a bit.  He denies any pure vertigo or spinning sensation type symptoms.  Patient denies signs of stroke including: facial droop, slurred speech, aphasia, weakness/numbness in extremities.  No chest pain or shortness of breath.  No headache.  Onset of symptoms acute.  Nothing makes symptoms better or worse.  Patient reports alcohol use, last drink was 2 days ago.      Past Medical History:  Diagnosis Date  . AAA (abdominal aortic aneurysm) (HCC)    4.6 cm x 4. 5 cm per 05-30-17 [facility] ct referred to dr early to see april 2019  . Anemia   . Artery stenosis (HCC)    L carotid  . Arthritis    BACK  . Back pain   . Complication of anesthesia    WOKE UP DURING COLONSCOPY  . COPD (chronic obstructive pulmonary disease) ([employer])   . CVA (cerebral infarction) 05/27/09   TIA, posterior circulation 7/11  . Depression   . Glaucoma    LEFT EYE WORSE  . Groin swelling   . Hemorrhage 1996   intracerebral  . History of kidney stones   . HLD (hyperlipidemia)   . HTN (hypertension)   . ICAO (internal carotid artery occlusion)    right ICA stenosis, s/p R carotid endarterectomy on 06/13/09  . Meningitis    viral; as a child   . Monoclonal gammopathy of unknown significance 02/07/2013   No anemia,no elevation of Iggs,  no m-spike, ?monoclonal protein on  IFE 9/14  . PONV (postoperative nausea and vomiting)    NAUSEA  . Seizure disorder ([place])    at time of [place] in 1996. No seizures for quite awhile  . Seizures ([place])     5 years ago  . Stroke [facility]) March 2011    Patient Active Problem List   Diagnosis Date Noted  . History of stroke 11/11/2017  . Carotid artery stenosis 08/08/2017  . Sepsis secondary to UTI ([place]) 12/12/2016  . Essential hypertension 12/12/2016  . Leukocytosis 12/12/2016  . Dyslipidemia 12/12/2016  . Acute renal failure (ARF) ([place]) 12/12/2016  . Anemia of chronic disease 12/12/2016  . History of TIA (transient ischemic attack) 11/11/2016  . Monoclonal gammopathy of unknown significance 02/07/2013  . Stenosis of carotid artery 09/12/2012  . Seizure disorder ([place]) 05/18/2010    Past Surgical History:  Procedure Laterality Date  . CAROTID ENDARTERECTOMY Right 06/12/09   cea  . COLONOSCOPY  06/16/01   by Dr. Earle Gell. positive for colonic polyp, non-adenomatous   . COLONOSCOPY WITH PROPOFOL N/A 02/03/2015   Procedure: COLONOSCOPY WITH PROPOFOL;  Surgeon: Garlan Fair, MD;  Location: [facility];  Service: Endoscopy;  Laterality: N/A;  . CYSTOSCOPY/URETEROSCOPY/HOLMIUM LASER/STENT PLACEMENT Left 06/15/2017   Procedure: CYSTOSCOPY/RETROGRADE/URETEROSCOPY/ /STENT PLACEMENT;  Surgeon: Ceasar Mons, MD;  Location: [facility];  Service: Urology;  Laterality: Left;  . ENDARTERECTOMY  Left 08/08/2017   Procedure: ENDARTERECTOMY CAROTID LEFT;  Surgeon: Rosetta Posner, MD;  Location: [facility];  Service: Vascular;  Laterality: Left;  . GLAUCOMA REPAIR  2016   by taking out cataracts (bilateral)  . PATCH ANGIOPLASTY Left 08/08/2017   Procedure: PATCH ANGIOPLASTY CAROTID LEFT USING HEMASHIELD PATCH;  Surgeon: Rosetta Posner, MD;  Location: [facility];  Service: Vascular;  Laterality: Left;  . R CEA  3/11  . R inguinal hernia repair  07/05/09        Home Medications    Prior to Admission medications     Medication Sig Start Date End Date Taking? Authorizing Provider  aspirin 325 MG tablet Take 325 mg by mouth daily. Will start 08/06/17    [provider]  budesonide-formoterol (SYMBICORT) 80-4.5 MCG/ACT inhaler Inhale 2 puffs into the lungs 2 (two) times daily as needed (for wheezing).     [provider]  Cholecalciferol (VITAMIN D) 2000 units tablet Take 2,000 Units by mouth daily.     [provider]  dipyridamole-aspirin (AGGRENOX) 200-25 MG 12hr capsule Take 1 capsule by mouth 2 (two) times daily. 11/11/17   Dennie Bible, NP  ferrous sulfate 325 (65 FE) MG tablet Take 325 mg by mouth daily with breakfast.    [provider]  oxyCODONE-acetaminophen (PERCOCET/ROXICET) 5-325 MG tablet Take 1-2 tablets by mouth every 6 (six) hours as needed for moderate pain. Patient not taking: Reported on 08/23/2017 08/09/17   Dagoberto Ligas, PA-C  rosuvastatin (CRESTOR) 20 MG tablet Take 20 mg by mouth daily. 04/21/17   [provider]  sertraline (ZOLOFT) 50 MG tablet Take 50 mg by mouth every morning.     [provider]  tamsulosin (FLOMAX) 0.4 MG CAPS capsule Take 0.4 mg by mouth daily after supper.    [provider]  TEGRETOL-XR 400 MG 12 hr tablet Take 1 tablet (400 mg total) by mouth 4 (four) times daily. 11/11/17   Dennie Bible, NP  valsartan-hydrochlorothiazide (DIOVAN-HCT) 160-25 MG per tablet Take 0.5 tablets by mouth at bedtime.     [provider]    Family History Family History  Problem Relation Age of Onset  . Heart disease Father        Heart Disease before age 62  . Heart attack Father   . Heart disease Mother   . Hyperlipidemia Brother   . Hyperlipidemia Sister   . Heart attack Sister        in younger sister    Social History Social History   Tobacco Use  . Smoking status: Current Every Day Smoker    Packs/day: 1.00    Types: Cigarettes  . Smokeless tobacco: Current User    Types: Chew   Substance Use Topics  . 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 Drug use: Not Currently    Types: Marijuana    Comment: MARIJUANA 30 YRS AGO     Allergies   Tape   Review of Systems Review of Systems  Constitutional: Negative for fever.  HENT: Negative for congestion, dental problem, rhinorrhea and sinus pressure.   Eyes: Negative for photophobia, discharge, redness and visual disturbance.  Respiratory: Negative for shortness of breath.   Cardiovascular: Negative for chest pain.  Gastrointestinal: Negative for nausea and vomiting.  Musculoskeletal: Positive for gait problem. Negative for neck pain and neck stiffness.  Skin: Negative for rash.  Neurological: Positive for dizziness and light-headedness. Negative for syncope, facial asymmetry, speech difficulty, weakness, numbness and headaches.  Psychiatric/Behavioral: Negative for confusion.     Physical Exam Updated Vital Signs BP (!) 96/59   Pulse 77   Temp 98.1 F (36.7 C) (Oral)   Resp 14   Ht 6\' 2"  (1.88 m)   Wt 81.6 kg   SpO2 98%   BMI 23.11 kg/m   Physical Exam Vitals signs and nursing note reviewed.  Constitutional:      Appearance: He is well-developed.  HENT:     Head: Normocephalic and atraumatic.     Right Ear: Tympanic membrane, ear canal and external ear normal.     Left Ear: Tympanic membrane, ear canal and external ear normal.     Nose: Nose normal.     Mouth/Throat:     Pharynx: Uvula midline.  Eyes:     General: Lids are normal.        Right eye: No discharge.        Left eye: No discharge.     Extraocular Movements:     Right eye: Nystagmus present. Normal extraocular motion.     Left eye: Nystagmus present. Normal extraocular motion.     Conjunctiva/sclera: Conjunctivae normal.     Pupils: Pupils are equal, round, and reactive to light.  Neck:     Musculoskeletal: Normal range of  motion and neck supple.     Vascular: No carotid bruit.  Cardiovascular:     Rate and Rhythm: Normal rate and regular rhythm.     Heart sounds: Normal heart sounds.  Pulmonary:     Effort: Pulmonary effort is normal.     Breath sounds: Normal breath sounds.  Abdominal:     Palpations: Abdomen is soft.     Tenderness: There is no abdominal tenderness.  Musculoskeletal: Normal range of motion.     Cervical back: He exhibits normal range of motion, no tenderness and no bony tenderness.  Skin:    General: Skin is warm and dry.  Neurological:     Mental Status: He is alert and oriented to person, place, and time.     GCS: GCS eye subscore is 4. GCS verbal subscore is 5. GCS motor subscore is 6.     Cranial Nerves: No cranial nerve deficit.     Sensory: No sensory deficit.     Motor: No abnormal muscle tone.     Coordination: Coordination normal.     Deep Tendon Reflexes: Reflexes are normal and symmetric.     Comments: Patient with normal finger-to-nose.  Patient with nystagmus with rightward gaze, lateral, fast phase to the right.      ED Treatments / Results  Labs (all labs ordered are listed, but only abnormal results are displayed) Labs Reviewed  CBC WITH DIFFERENTIAL/PLATELET - Abnormal; Notable for the following components:      Result Value   RBC 3.80 (*)    Hemoglobin 12.2 (*)    HCT 34.5 (*)    RDW 11.3 (*)    All other components within normal limits  COMPREHENSIVE METABOLIC PANEL - Abnormal; Notable for the following components:   Sodium 120 (*)    Potassium 2.6 (*)    Chloride 82 (*)    Glucose, Bld 121 (*)    Calcium 8.6 (*)    All other components within normal limits  RAPID URINE DRUG SCREEN, HOSP PERFORMED - Abnormal; Notable for the following components:  Benzodiazepines POSITIVE (*)    All other components within normal limits  URINALYSIS, ROUTINE W REFLEX MICROSCOPIC - Abnormal; Notable for the following components:   Hgb urine dipstick TRACE (*)     All other components within normal limits  URINALYSIS, MICROSCOPIC (REFLEX) - Abnormal; Notable for the following components:   Bacteria, UA RARE (*)    All other components within normal limits  LIPASE, BLOOD  ETHANOL  PROTIME-INR  APTT  TROPONIN I    EKG EKG Interpretation  Date/Time:  Thursday May 18 2018 17:28:01 EST Ventricular Rate:  67 PR Interval:  182 QRS Duration: 146 QT Interval:  458 QTC Calculation: 483 R Axis:   82 Text Interpretation:  Normal sinus rhythm Possible Left atrial enlargement Right bundle branch block Abnormal ECG Confirmed by Fredia Sorrow [phone]) on 05/18/2018 5:30:42 PM   Radiology No results found.  Procedures Procedures (including critical care time)  Medications Ordered in ED Medications  0.9 %  sodium chloride infusion ( Intravenous New Bag/Given 05/18/18 1951)  sodium chloride 0.9 % bolus 500 mL ( Intravenous Stopped 05/18/18 1845)  potassium chloride SA (K-DUR,KLOR-CON) CR tablet 60 mEq (60 mEq Oral Given 05/18/18 1835)  iopamidol (ISOVUE-370) 76 % injection 100 mL (100 mLs Intravenous Contrast Given 05/18/18 2016)     Initial Impression / Assessment and Plan / ED Course  I have reviewed the triage vital signs and the nursing notes.  Pertinent labs & imaging results that were available during my care of the patient were reviewed by me and considered in my medical decision making (see chart for details).     Patient seen and examined. Work-up initiated.  Patient with previous stroke and known carotid artery stenosis.  He has dizziness that is most consistent with ataxia.  Will need CVA work-up.  Symptoms ongoing for 2 weeks.  Will obtain CT of the head, CT angiography of the head and neck.  No code stroke as symptoms have been present for greater than 24 hours.  Vital signs reviewed and are as follows: BP 128/77   Pulse 66   Temp 98.1 F (36.7 C) (Oral)   Resp 18   Ht 6\' 2"  (1.88 m)   Wt 81.6 kg   SpO2 95%   BMI 23.11 kg/m    6:23 PM patient discussed with Dr. Rogene Houston.  Electrolyte derangement noted.  Normal saline and oral potassium ordered.  Pending imaging.  8:25 PM patient updated on results to this point.  Also discussed with his sister at bedside who is a former emergency department nurse.  Pending CT. patient is already adamant about not being admitted to the hospital stating that it cost too much money and he is appropriate follow-up with his vascular surgeon as an outpatient next week.  We discussed his abnormal electrolytes and the symptoms that these could cause.  He states that he is feeling much better and much less dizzy after IV fluids.  We discussed that we may not be able to rule out acute or subacute stroke based on the CT alone.  He is willing to stay for CT imaging.  Patient discussed with Dr. Rogene Houston who will see.  He is aware of patient's desire not to be admitted to the hospital.  Pending completion of ED work-up.  Final Clinical Impressions(s) / ED Diagnoses   Final diagnoses:  Dizziness  Hyponatremia  Hypokalemia   Pending completion of work-up.   ED Discharge Orders    None       Yona Kosek,  Jinny Sanders 05/18/18 2028    Fredia Sorrow, MD 05/18/18 2151

## 2018-05-18 NOTE — Discharge Instructions (Addendum)
Potassium was slightly low here today.  List of foods high in potassium provided.  Try to make sure that you eat these foods.  Follow-up with Dr. Donnetta Hutching from vascular surgery.  Return for any new or worse symptoms.  Also take the oral potassium tablets as directed for the next few days.

## 2018-05-23 ENCOUNTER — Ambulatory Visit: Payer: 59 | Admitting: Vascular Surgery

## 2018-06-27 ENCOUNTER — Ambulatory Visit: Payer: 59 | Admitting: Vascular Surgery

## 2018-06-27 ENCOUNTER — Other Ambulatory Visit: Payer: Self-pay

## 2018-06-27 ENCOUNTER — Encounter: Payer: Self-pay | Admitting: Vascular Surgery

## 2018-06-27 VITALS — BP 100/66 | HR 71 | Temp 97.1°F | Resp 20 | Ht 74.0 in | Wt 180.0 lb

## 2018-06-27 DIAGNOSIS — I6522 Occlusion and stenosis of left carotid artery: Secondary | ICD-10-CM | POA: Diagnosis not present

## 2018-06-27 NOTE — Progress Notes (Signed)
[facility]  Patient name: [name] MRN: [id number] DOB: 02-17-1956 Sex: male  REASON FOR VISIT: Follow-up left carotid endarterectomy 08/08/2017  HPI: [name] is a 63 y.o. male here today for follow-up.  Had prior left carotid endarterectomy in April 2019 and remote history of right carotid endarterectomy in 2011.  He has had no neurologic deficits.  Unfortunately continues to be a smoker.  Past Medical History:  Diagnosis Date  . AAA (abdominal aortic aneurysm) (HCC)    4.6 cm x 4. 5 cm per 05-30-17 [facility] ct referred to dr Janique Hoefer to see april 2019  . Anemia   . Artery stenosis (HCC)    L carotid  . Arthritis    BACK  . Back pain   . Complication of anesthesia    WOKE UP DURING COLONSCOPY  . COPD (chronic obstructive pulmonary disease) ([place])   . CVA (cerebral infarction) 05/27/09   TIA, posterior circulation 7/11  . Depression   . Glaucoma    LEFT EYE WORSE  . Groin swelling   . Hemorrhage 1996   intracerebral  . History of kidney stones   . HLD (hyperlipidemia)   . HTN (hypertension)   . ICAO (internal carotid artery occlusion)    right ICA stenosis, s/p R carotid endarterectomy on 06/13/09  . Meningitis    viral; as a child   . Monoclonal gammopathy of unknown significance 02/07/2013   No anemia,no elevation of Iggs,  no m-spike, ?monoclonal protein on IFE 9/14  . PONV (postoperative nausea and vomiting)    NAUSEA  . Seizure disorder ([place])    at time of [place] in 1996. No seizures for quite awhile  . Seizures ([place])     5 years ago  . Stroke [facility]) March 2011    Family History  Problem Relation Age of Onset  . Heart disease Father        Heart Disease before age 78  . Heart attack Father   . Heart disease Mother   . Hyperlipidemia Brother   . Hyperlipidemia Sister   . Heart attack Sister        in younger sister    SOCIAL HISTORY: Social History   Tobacco Use  .  Smoking status: Current Every Day Smoker    Packs/day: 1.00    Types: Cigarettes  . Smokeless tobacco: Current User    Types: Chew  Substance Use Topics  . Alcohol use: Yes    Alcohol/week: 0.0 standard drinks    Comment: occasional (has a hx of alcohol abuse - been in remission since 12/08/09)    Allergies  Allergen Reactions  . Tape Rash    ADHESIVE    Current Outpatient Medications  Medication Sig Dispense Refill  . ALPRAZolam (XANAX) 0.25 MG tablet Take 0.25 mg by mouth daily as needed.    Marland Kitchen aspirin 325 MG tablet Take 325 mg by mouth daily. Will start 08/06/17    . budesonide-formoterol (SYMBICORT) 80-4.5 MCG/ACT inhaler Inhale 2 puffs into the lungs 2 (two) times daily as needed (for wheezing).     . Cholecalciferol (VITAMIN D) 2000 units tablet Take 2,000 Units by mouth daily.     Marland Kitchen dipyridamole-aspirin (AGGRENOX) 200-25 MG 12hr capsule Take 1 capsule by mouth 2 (two) times daily. 180 capsule 3  . ferrous sulfate 325 (65 FE) MG tablet Take 325 mg by mouth daily with breakfast.    . potassium chloride SA (K-DUR,KLOR-CON) 20 MEQ tablet  Take 1 tablet (20 mEq total) by mouth daily. 4 tablet 0  . rosuvastatin (CRESTOR) 20 MG tablet Take 20 mg by mouth daily.    . sertraline (ZOLOFT) 50 MG tablet Take 50 mg by mouth every morning.     . TEGRETOL-XR 400 MG 12 hr tablet Take 1 tablet (400 mg total) by mouth 4 (four) times daily. 360 tablet 3  . valsartan-hydrochlorothiazide (DIOVAN-HCT) 160-25 MG per tablet Take 0.5 tablets by mouth at bedtime.      No current facility-administered medications for this visit.     REVIEW OF SYSTEMS:  [X]  denotes positive finding, [ ]  denotes negative finding Cardiac  Comments:  Chest pain or chest pressure:    Shortness of breath upon exertion:    Short of breath when lying flat:    Irregular heart rhythm:        Vascular    Pain in calf, thigh, or hip brought on by ambulation:    Pain in feet at night that wakes you up from your sleep:      Blood clot in your veins:    Leg swelling:           PHYSICAL EXAM: Vitals:   06/27/18 0905 06/27/18 0908  BP: 105/64 100/66  Pulse: 71   Resp: 20   Temp: (!) 97.1 F (36.2 C)   SpO2: 99%   Weight: 180 lb (81.6 kg)   Height: 6\' 2"  (1.88 m)     GENERAL: The patient is a well-nourished male, in no acute distress. The vital signs are documented above. CARDIOVASCULAR: Carotid incisions healed bilaterally.  Soft right carotid bruit and no bruit on the left.  2+ radial pulses bilaterally. PULMONARY: There is good air exchange  MUSCULOSKELETAL: There are no major deformities or cyanosis. NEUROLOGIC: No focal weakness or paresthesias are detected. SKIN: There are no ulcers or rashes noted. PSYCHIATRIC: The patient has a normal affect.  DATA:  Carotid duplex from 05/09/2018 revealed widely patent left carotid endarterectomy.  Approximately 50% restenosis in the right internal carotid artery  MEDICAL ISSUES: Stable overall.  Again explained the critical importance of smoking cessation to reduce his risk for recurrent disease.  We will see him again in 1 year with repeat carotid duplex.  We will also obtain ultrasound at that time for follow-up of his 4.5 cm abdominal aortic aneurysm    Rosetta Posner, MD [facility] Tel [phone] Pager [phone]

## 2018-06-28 ENCOUNTER — Other Ambulatory Visit: Payer: Self-pay

## 2018-06-28 DIAGNOSIS — I714 Abdominal aortic aneurysm, without rupture, unspecified: Secondary | ICD-10-CM

## 2018-06-29 ENCOUNTER — Ambulatory Visit (HOSPITAL_COMMUNITY)
Admission: RE | Admit: 2018-06-29 | Discharge: 2018-06-29 | Disposition: A | Discharge status: Home / Self Care | Payer: 59 | Source: Ambulatory Visit | Attending: Family | Admitting: Family

## 2018-06-29 ENCOUNTER — Other Ambulatory Visit: Payer: Self-pay

## 2018-06-29 DIAGNOSIS — I714 Abdominal aortic aneurysm, without rupture, unspecified: Secondary | ICD-10-CM

## 2018-10-12 ENCOUNTER — Other Ambulatory Visit: Payer: Self-pay

## 2018-10-12 MED ORDER — TEGRETOL-XR 400 MG PO TB12: 400.0000 mg | ORAL_TABLET | Freq: Four times a day (QID) | ORAL | 3 refills | Status: DC

## 2018-10-12 MED ORDER — ASPIRIN-DIPYRIDAMOLE ER 25-200 MG PO CP12: 1.0000 | ORAL_CAPSULE | Freq: Two times a day (BID) | ORAL | 3 refills | Status: DC

## 2018-10-12 NOTE — Telephone Encounter (Signed)
Received paperwork from [facility] the patient is requesting on his medications.

## 2018-11-09 ENCOUNTER — Encounter: Payer: Self-pay | Admitting: Adult Health

## 2018-11-17 ENCOUNTER — Ambulatory Visit: Payer: 59 | Admitting: Adult Health

## 2018-11-17 ENCOUNTER — Ambulatory Visit: Payer: 59 | Admitting: Nurse Practitioner

## 2018-12-05 ENCOUNTER — Encounter: Payer: Self-pay | Admitting: Neurology

## 2018-12-05 ENCOUNTER — Ambulatory Visit (INDEPENDENT_AMBULATORY_CARE_PROVIDER_SITE_OTHER): Payer: Medicare Other | Admitting: Neurology

## 2018-12-05 ENCOUNTER — Other Ambulatory Visit: Payer: Self-pay

## 2018-12-05 VITALS — BP 136/75 | HR 74 | Temp 97.8°F | Wt 179.6 lb

## 2018-12-05 DIAGNOSIS — I6522 Occlusion and stenosis of left carotid artery: Secondary | ICD-10-CM

## 2018-12-05 DIAGNOSIS — I6521 Occlusion and stenosis of right carotid artery: Secondary | ICD-10-CM

## 2018-12-05 MED ORDER — TEGRETOL-XR 400 MG PO TB12: 400.0000 mg | ORAL_TABLET | Freq: Three times a day (TID) | ORAL | 3 refills | Status: DC

## 2018-12-05 NOTE — Progress Notes (Signed)
[facility]  PATIENT: [name] DOB: 1955/04/23   REASON FOR VISIT: Follow-up for seizure disorder, history of stroke , risk factors of smoking hypertension hyperlipidemia HISTORY FROM: Patient    HISTORY OF PRESENT ILLNESS:UPDATE 8/2/2019CM Mr. [name], 63 year old male returns for follow-up with history of seizure disorder currently well controlled on Tegretol without side effects and no seizures in several years.  He also has a history of posterior circulatory TIA in September 2011 and remote brain intracranial hemorrhage in 1996.  He has risk factors of hypertension hyperlipidemia smoking, carotid artery disease.  He continues to work full-time he remains on Aggrenox for secondary stroke prevention without bruising or bleeding he has not had further stroke or TIA symptoms.  He is also on Crestor without myalgias.  Most recent carotid Doppler done 07/22/2017 with severe stenosis involving the proximal left internal carotid artery measuring greater than 70%.  Extensive atherosclerotic disease in bilateral carotid arteries estimated degree of stenosis in the right is less than 50%..  Patent vertebral arteries with antegrade flow.  He had left carotid endarterectomy on 08/08/2017.  He also had CT of the abdomen 05/20/2017 which demonstrated 4.6 cm aortic aneurysm.  He is followed by Dr. Donnetta Hutching.  Blood pressure in the office today 144/80.  Patient claims he has not taken his blood pressure medicines this morning.  He returns for reevaluation.  UPDATE 11/11/16 CM Mr. [name], 63 year old male returns for yearly follow-up. He has a history of seizure disorder with no seizures in several years. He is currently maintained on Tegretol and he claims recent labs were done at Dr. Inda Merlin office. He also has a history of posterior circulatory TIA in September 2011, and remote brain intracranial hemorrhage in 1996. He has risk factors of smoking hypertension hyperlipidemia. 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 He is currently on Aggrenox for secondary stroke prevention without bruising or bleeding. He has not had further stroke or TIA symptoms He is on Crestor without complaints of myalgias. Blood pressure in the office today 139/81. He returns for reevaluation   UPDATE 8/10/17CMMr. [name], 63 year old male returns for yearly followup.  .He has a history of posterior circulatory TIA in September of 2011 with vascular risk factors of smoking, hypertension, hyperlipidemia, and remote brain intracranial hemorrhage in 1996. He also has had a seizure disorder since 1978 following encephalitis with breakthrough seizures occurring following a brain hemorrhage in 1996 currently well controlled on Tegretol. In addition he has history of right subcortical infarct in February of 2011 right carotid endarterectomy in March of 2011 He was initially started on Plavix but had diarrhea on the drug. He was on Aggrenox  however stopped the drug due to the expense of greater than $200 a month. He was then  on aspirin now  back on Aggrenox as he now has insurance He denies further stroke or TIA symptoms since last seen. Carotid duplex with Dr. Kellie Simmering 09/18/2013 with progression left ICA  stenosis.  Dopplers followed by [facility]. He works full-time as an Clinical biochemist He returns for reevaluation .  Update 12/05/2018 : He returns for follow-up after last visit with [name], nurse practitioner a year ago.  Continues to do well without recurrent TIA or stroke symptoms none since 2011.  He is not also had any seizures for several years.  He remains on Aggrenox which is tolerating well without side effects.  States his blood pressure is under good control and today it is 130/75.  He remains on  Crestor which is tolerating well without side effects and states last lipid profile was satisfactory.  He continues to smoke and states he cannot cut back and stop.  He remains on Tegretol-XR 400 mg 4 times daily and had  lab work in April 2018 which showed carbamazepine level of 11.8 and normal white count and BMP.  He is worried that his Tegretol-XR is going to being switched to the generic form and whether to be as effective.  Patient had last follow-up carotid ultrasound in January 2020 in Dr. Luther Parody office which showed 50% right ICA stenosis.  He plans on having a follow-up appointment soon REVIEW OF SYSTEMS: Full 14 system review of systems performed and notable only for those listed, all others are neg:  No complaints today ALLERGIES: Allergies  Allergen Reactions  . Tape Rash    ADHESIVE    HOME MEDICATIONS: Outpatient Medications Prior to Visit  Medication Sig Dispense Refill  . ALPRAZolam (XANAX) 0.25 MG tablet Take 0.25 mg by mouth daily as needed.    Marland Kitchen AMLODIPINE-VALSARTAN-HCTZ PO     . budesonide-formoterol (SYMBICORT) 80-4.5 MCG/ACT inhaler Inhale 2 puffs into the lungs 2 (two) times daily as needed (for wheezing).     . Cholecalciferol (VITAMIN D) 2000 units tablet Take 2,000 Units by mouth daily.     Marland Kitchen dipyridamole-aspirin (AGGRENOX) 200-25 MG 12hr capsule Take 1 capsule by mouth 2 (two) times daily. 180 capsule 3  . ferrous sulfate 325 (65 FE) MG tablet Take 325 mg by mouth daily with breakfast.    . pantoprazole (PROTONIX) 40 MG tablet     . PANTOPRAZOLE SODIUM PO     . rosuvastatin (CRESTOR) 10 MG tablet     . sertraline (ZOLOFT) 50 MG tablet Take 50 mg by mouth every morning.     . valsartan-hydrochlorothiazide (DIOVAN-HCT) 160-25 MG per tablet Take 0.5 tablets by mouth at bedtime.     . TEGRETOL-XR 400 MG 12 hr tablet Take 1 tablet (400 mg total) by mouth 4 (four) times daily. 360 tablet 3  . potassium chloride SA (K-DUR,KLOR-CON) 20 MEQ tablet Take 1 tablet (20 mEq total) by mouth daily. (Patient not taking: Reported on 12/05/2018) 4 tablet 0  . rosuvastatin (CRESTOR) 20 MG tablet Take 20 mg by mouth daily.    . tadalafil (CIALIS) 20 MG tablet      No facility-administered  medications prior to visit.     PAST MEDICAL HISTORY: Past Medical History:  Diagnosis Date  . AAA (abdominal aortic aneurysm) (HCC)    4.6 cm x 4. 5 cm per 05-30-17 [facility] ct referred to dr early to see april 2019  . Anemia   . Artery stenosis (HCC)    L carotid  . Arthritis    BACK  . Back pain   . Complication of anesthesia    WOKE UP DURING COLONSCOPY  . COPD (chronic obstructive pulmonary disease) ([place])   . CVA (cerebral infarction) 05/27/09   TIA, posterior circulation 7/11  . Depression   . Glaucoma    LEFT EYE WORSE  . Groin swelling   . Hemorrhage 1996   intracerebral  . History of kidney stones   . HLD (hyperlipidemia)   . HTN (hypertension)   . ICAO (internal carotid artery occlusion)    right ICA stenosis, s/p R carotid endarterectomy on 06/13/09  . Meningitis    viral; as a child   . Monoclonal gammopathy of unknown significance 02/07/2013   No anemia,no elevation of Iggs,  no m-spike, ?monoclonal protein on IFE 9/14  . PONV (postoperative nausea and vomiting)    NAUSEA  . Seizure disorder ([place])    at time of [place] in 1996. No seizures for quite awhile  . Seizures ([place])     5 years ago  . Stroke [facility]) March 2011    PAST SURGICAL HISTORY: Past Surgical History:  Procedure Laterality Date  . CAROTID ENDARTERECTOMY Right 06/12/09   cea  . COLONOSCOPY  06/16/01   by Dr. Earle Gell. positive for colonic polyp, non-adenomatous   . COLONOSCOPY WITH PROPOFOL N/A 02/03/2015   Procedure: COLONOSCOPY WITH PROPOFOL;  Surgeon: Garlan Fair, MD;  Location: [facility];  Service: Endoscopy;  Laterality: N/A;  . CYSTOSCOPY/URETEROSCOPY/HOLMIUM LASER/STENT PLACEMENT Left 06/15/2017   Procedure: CYSTOSCOPY/RETROGRADE/URETEROSCOPY/ /STENT PLACEMENT;  Surgeon: Ceasar Mons, MD;  Location: [facility];  Service: Urology;  Laterality: Left;  . ENDARTERECTOMY Left 08/08/2017   Procedure: ENDARTERECTOMY CAROTID LEFT;  Surgeon: Rosetta Posner, MD;  Location: [facility];  Service: Vascular;  Laterality: Left;  . GLAUCOMA REPAIR  2016   by taking out cataracts (bilateral)  . PATCH ANGIOPLASTY Left 08/08/2017   Procedure: PATCH ANGIOPLASTY CAROTID LEFT USING HEMASHIELD PATCH;  Surgeon: Rosetta Posner, MD;  Location: [facility];  Service: Vascular;  Laterality: Left;  . R CEA  3/11  . R inguinal hernia repair  07/05/09    FAMILY HISTORY: Family History  Problem Relation Age of Onset  . Heart disease Father        Heart Disease before age 70  . Heart attack Father   . Heart disease Mother   . Hyperlipidemia Brother   . Hyperlipidemia Sister   . Heart attack Sister        in younger sister    SOCIAL HISTORY: Social History   Socioeconomic History  . Marital status: Married    Spouse name: Not on file  . Number of children: 2  . Years of education: 12+  . Highest education level: Not on file  Occupational History  . Occupation: ELECTRICIAN    Employer: Location manager  Social Needs  . Financial resource strain: Not on file  . Food insecurity    Worry: Not on file    Inability: Not on file  . Transportation needs    Medical: Not on file    Non-medical: Not on file  Tobacco Use  . Smoking status: Current Every Day Smoker    Packs/day: 1.00    Types: Cigarettes  . Smokeless tobacco: Current User    Types: Chew  Substance and Sexual Activity  . 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 Drug use: Not Currently    Types: Marijuana    Comment: MARIJUANA 30 YRS AGO  . Sexual activity: Not on file  Lifestyle  . Physical activity    Days per week: Not on file    Minutes per session: Not on file  . Stress: Not on file  Relationships  . Social Herbalist on phone: Not on file    Gets together: Not on file    Attends religious service: Not on file    Active member of club or organization: Not on file    Attends  meetings of clubs or organizations: Not on file    Relationship status: Not on file  .  Intimate partner violence    Fear of current or ex partner: Not on file    Emotionally abused: Not on file    Physically abused: Not on file    Forced sexual activity: Not on file  Other Topics Concern  . Not on file  Social History Narrative   Married, lives in [place] with his wife.   2 children (daughter, Janett Billow, is a Marine scientist on [employer] at [employer])    Patient is right handed   Patient has a high school education with some college.   Patient drinks at least 5 cups daily.           PHYSICAL EXAM  Vitals:   12/05/18 1544  BP: 136/75  Pulse: 74  Temp: 97.8 F (36.6 C)  Weight: 81.5 kg   Body mass index is 23.06 kg/m. General: well developed, well nourished middle aged 10 male, seated, in no evident distress  Head: head normocephalic and atraumatic.   Neck: supple with bilateral soft carotid bruits.Bilateral  CEA scars.  Cardiovascular: regular rate and rhythm, no murmurs  Neurologic Exam  Mental Status: Awake and fully alert. Oriented to place and time. Follows all commands. Speech and language normal.  Cranial Nerves: Pupils equal, briskly reactive to light. Extraocular movements full without nystagmus. Visual fields full to confrontation. Hearing intact and symmetric to finger snap. Facial sensation intact. Face, tongue, palate move normally and symmetrically. Neck flexion and extension normal.  Motor: Normal bulk and tone. Normal strength in all tested extremity muscles.No focal weakness  Sensory.: intact to touch and pinprick and vibratory.  Coordination: Rapid alternating movements normal in all extremities. Finger-to-nose and heel-to-shin performed accurately bilaterally.  Gait and Station: Arises from chair without difficulty. Stance is normal. Gait demonstrates normal stride length and balance . Able to heel, toe and tandem walk without difficulty.  Reflexes: 2+ and  symmetric. Toes downgoing.  DIAGNOSTIC DATA (LABS, IMAGING, TESTING)  ASSESSMENT AND PLAN  63 y.o. year old male  has a past medical history of Hemorrhage (1996); CVA (cerebral infarction) (05/27/09); HTN (hypertension); HLD (hyperlipidemia); ICAO (internal carotid artery occlusion); Artery stenosis; Seizure disorder; Meningitis; Stroke (March 2011); Myocardial infarction (March 2011); and Monoclonal gammopathy of unknown significance (02/07/2013). here to follow-up. He has not had stroke or TIA symptoms. He has not had any seizure activity in several years.  Most recent carotid Doppler done 05/01/18 with moderate stenosis involving the proximal left internal carotid artery measuring 50%.    PLAN: I had a long d/w patient about his remote  Stroke and seizures, risk for recurrent stroke/TIAs, personally independently reviewed imaging studies and stroke evaluation results and answered questions.Continue Aggrenox twice a day  for secondary stroke prevention and maintain strict control of hypertension with blood pressure goal below 130/90, diabetes with hemoglobin A1c goal below 6.5% and lipids with LDL cholesterol goal below 70 mg/dL. I also advised the patient to eat a healthy diet with plenty of whole grains, cereals, fruits and vegetables, exercise regularly and maintain ideal body weight.Reduce tegretol XR dose to 400 mg three times daily as he has been seizure free for several years now.He was counselled to quit smoking but is not agreeable . Continue f/u with Dr early for carotid stenosis.  Greater than 50% time during this 25-minute follow-up visit was spent on counseling and coordination of care about his carotid stenosis, TIAs risk and seizures and answering questions followup in the future with me in 1 year Antony Contras, MD [facility] [address],  [place], [place] [address] [phone]

## 2018-12-05 NOTE — Patient Instructions (Signed)
I had a long d/w patient about his remote  Stroke and seizures, risk for recurrent stroke/TIAs, personally independently reviewed imaging studies and stroke evaluation results and answered questions.Continue Aggrenox twice a day  for secondary stroke prevention and maintain strict control of hypertension with blood pressure goal below 130/90, diabetes with hemoglobin A1c goal below 6.5% and lipids with LDL cholesterol goal below 70 mg/dL. I also advised the patient to eat a healthy diet with plenty of whole grains, cereals, fruits and vegetables, exercise regularly and maintain ideal body weight.Reduce tegretol XR dose to 400 mg three times daily as he has been seizure free for several years now. Continue f/u with Dr early for carotid stenosis. Followup in the future with me in 1 year

## 2018-12-06 ENCOUNTER — Telehealth: Payer: Self-pay

## 2018-12-06 NOTE — Telephone Encounter (Signed)
Receive fax from [facility] mail order pharmacy at [phone]. They wanted to clarify the order of tegretol if the MD wanted it three times a day. They stated usually its dosage every 12 hours. I stated pt was taking it 4 times per day for the last 5 years. They made the change to brand name three times a day with 270 tablets and 3 refills.

## 2018-12-21 ENCOUNTER — Telehealth: Payer: Self-pay

## 2018-12-21 NOTE — Telephone Encounter (Signed)
PT sent in letter that insurance is not covering the medication. I called [phone] to discuss the letter pt receive. They stated it needs a PA and to call the authorization number at [phone]. PA was done on cover my meds.   Approvedtoday  PA Case: [id number], Status: Approved, Coverage Starts on: 04/12/2018 12:00:00 AM, Coverage Ends on: 04/12/2019 12:00:00 AM. Questions? Contact [phone].

## 2018-12-27 NOTE — Telephone Encounter (Signed)
I talk to [facility] pharmacist at [facility] to discuss some alternative medications for pt due to the high deductible and co payment. She stated pts current rx of tegretol Xr is tier 4 and that's the highest cost. If pt was prescribed the generic form its still costly and in tier 4. I explained the pt has been on the seizure medication since 1996 per notes at our office. The other alternative medications are in tier one that are cheaper include lamotrigine and levetiracetam regular .and levetiracetam Xr. The cost for the 3 medications would be a lower price for pt. I stated message will be sent to Dr.Sethi.

## 2018-12-27 NOTE — Telephone Encounter (Signed)
I called [facility] at [phone] to discuss the cost of the brand tegretol XR 400mg .They stated even if the pt was change to generic brand it will still be over 400 dollars for 3 month supply. They stated pt has a deductible of 435.00 he has to meet.The pt will need to call to find out once he meets his deductible if the price will decrease.I stated pt has been on the medication since 1996.

## 2018-12-28 NOTE — Telephone Encounter (Signed)
How about swiching to tegretol 400 mg immediate release generic three times daily? Will that be affordable ?

## 2019-01-02 ENCOUNTER — Other Ambulatory Visit (HOSPITAL_COMMUNITY): Payer: Self-pay | Admitting: Neurology

## 2019-01-02 MED ORDER — LEVETIRACETAM ER 500 MG PO TB24: 1000.0000 mg | ORAL_TABLET | Freq: Every day | ORAL | 1 refills | Status: DC

## 2019-01-02 NOTE — Telephone Encounter (Signed)
Ok to change to keppra  XR 500mg  daily. I will change the prescription

## 2019-01-02 NOTE — Telephone Encounter (Signed)
I called pt about his insurance recommend regular keppra, keppra XR or lamotrigine for affordable prices which are tier 2. I stated his insurance company stated brand or generic tegretol is a tier 4 and will still cost him over 400 monthly. Pt states he pays over 300 every three months for the tegretol now.He is still working full time and will retire next year. He will be on a fix income next year and would like to change to the lower cost medications. He has been on tegretol for the last 25 years. He has about a month left of current tegretol prescription. He will like to know which preferred lower seizure medication DR.Sethi recommend.

## 2019-01-03 NOTE — Telephone Encounter (Signed)
I recommend he taper the Tegretol as following.  Tegretol-XR 400 mg 3 times daily for 2 weeks, 2 times daily for 2 weeks, once a day for 2 weeks and stop.  Start Keppra ER  prior to starting taper of Tegretol

## 2019-01-03 NOTE — Telephone Encounter (Signed)
I called pt about new keppra Xr was sent to the pharmacy yesterday by Dr.SEThi. I stated Dr.Sethi wants him to titrate from the tegretol XR to taking the new prescription of keppra XR.PT stated he has about 15 pills left of his current prescription. He has a new 30 day supply bottle of  Tegretol that he has not started yet. I stated message will be sent to Dr.SEThi. I stated to not start the keppra until we get directions from Dr.Sethi and continue the tegretol XR. PT verbalized understanding.

## 2019-01-08 NOTE — Telephone Encounter (Signed)
I called patient about changing to Keppra ER per Dr.SEthi from Tegretol. I stated there will be a titration of tegretol with switching to KEppra. Pt stated he will like to stay on tegretol until December 2020. PT stated he will pay the price till the end of the year and his insurance will be changing in 2021. I stated message will be sent to Dr. Leonie Man that he will hold off changing medications.

## 2019-01-10 NOTE — Telephone Encounter (Signed)
Ok

## 2019-01-17 NOTE — Telephone Encounter (Signed)
Pt called wanting to speak to RN about the "medication situation". Please advise.

## 2019-01-17 NOTE — Telephone Encounter (Signed)
I called pt back about his tegretol medication. Patient stated the copayment is going to be higher in January 2021. Pt stated he will come by tomorrow and get a type copy of the titration directions. PT knows to wear the mask when he comes to the office.

## 2019-01-18 NOTE — Telephone Encounter (Signed)
Patient given titration instructions for tegretol XR and keppra XR. Pt will be discontinuing  Tegretol Xr and starting keppra ongoing because of unable to afford. PT came to office today and verbalized understanding.

## 2019-04-13 ENCOUNTER — Other Ambulatory Visit: Payer: Self-pay | Admitting: Neurology

## 2019-04-16 ENCOUNTER — Other Ambulatory Visit: Payer: Self-pay

## 2019-04-16 MED ORDER — LEVETIRACETAM ER 500 MG PO TB24: 500.0000 mg | ORAL_TABLET | Freq: Every day | ORAL | 1 refills | Status: DC

## 2019-04-16 NOTE — Telephone Encounter (Signed)
1) Medication(s) Requested (by name): levETIRAcetam (KEPPRA XR) 500 MG 24 hr tablet   2) Pharmacy of Choice: [id number] - [place], [place]  3) Special Requests:   Patient called to request a 90 day supply please follow up.

## 2019-04-30 IMAGING — US US SCROTUM
1 series · 14 of 25 positions shown · non-contrast
Comparison: None.

CLINICAL DATA: Acute left testicular pain and swelling after injury
last week.

EXAM:
SCROTAL ULTRASOUND
DOPPLER ULTRASOUND OF THE TESTICLES
TECHNIQUE: Complete ultrasound examination of the testicles, epididymis, and
other scrotal structures was performed. Color and spectral Doppler
ultrasound were also utilized to evaluate blood flow to the
testicles.

[Series 1: us scrotum · 0.07mm/px · 14 of 77 slices shown]
[im 1/77]
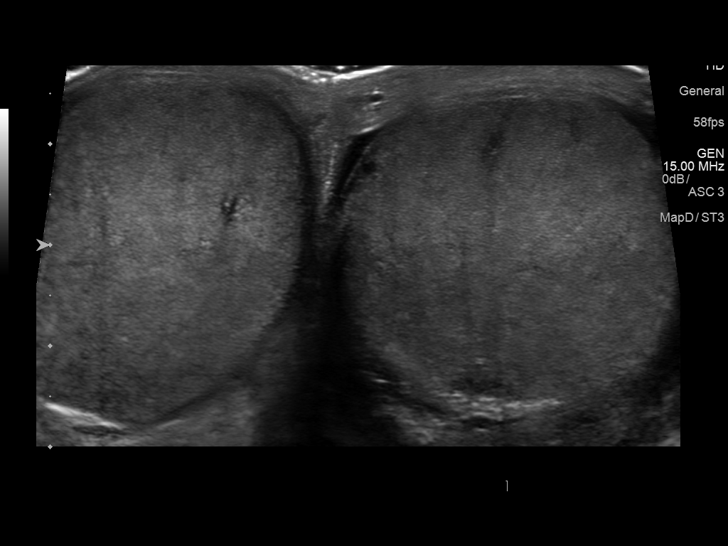
[im 7/77]
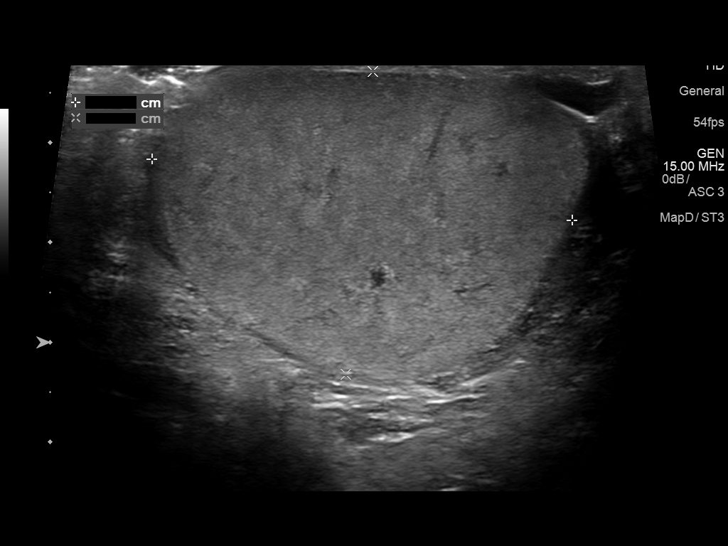
[im 13/77]
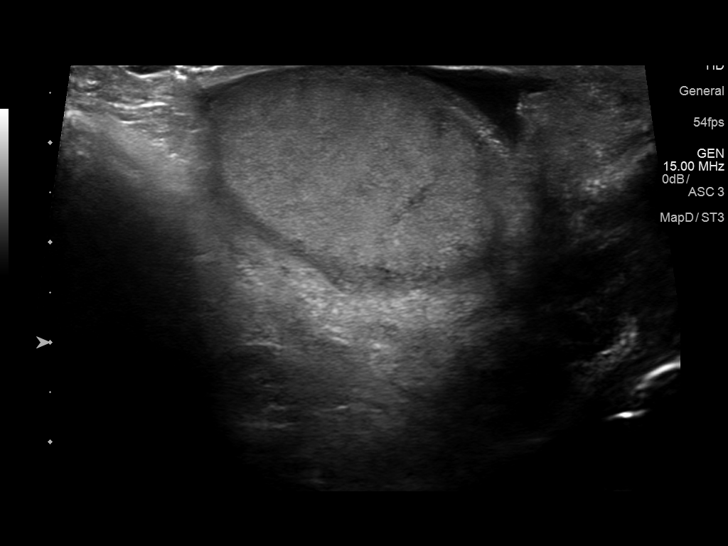
[im 20/77]
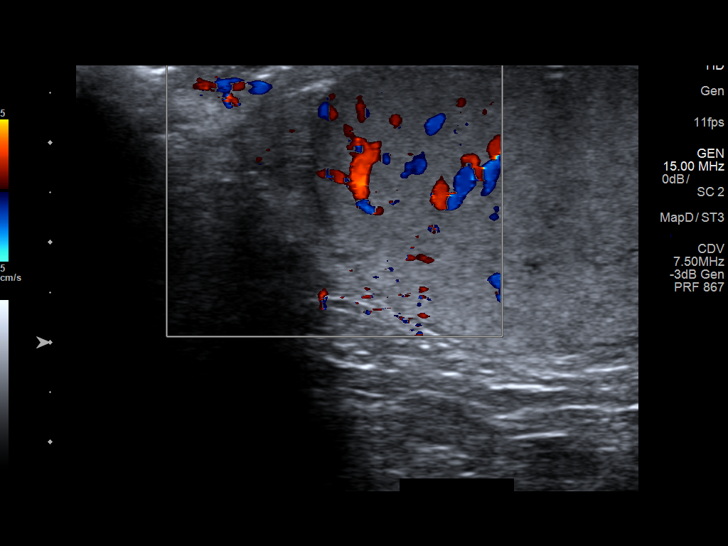
[im 26/77]
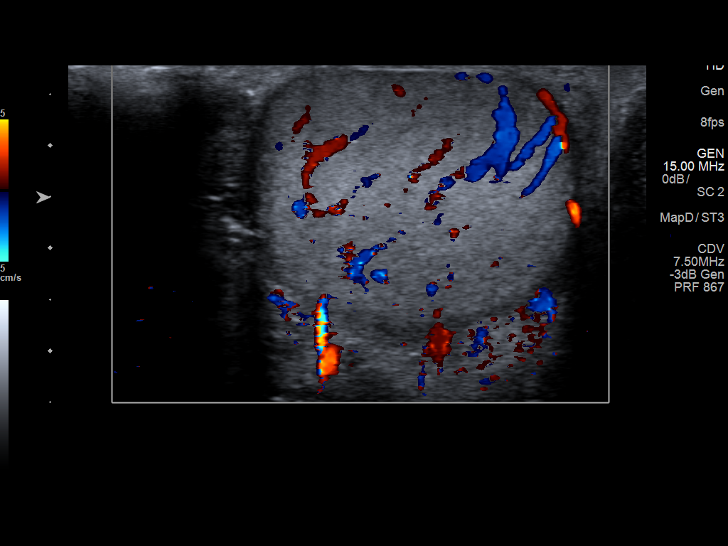
[im 29/77]
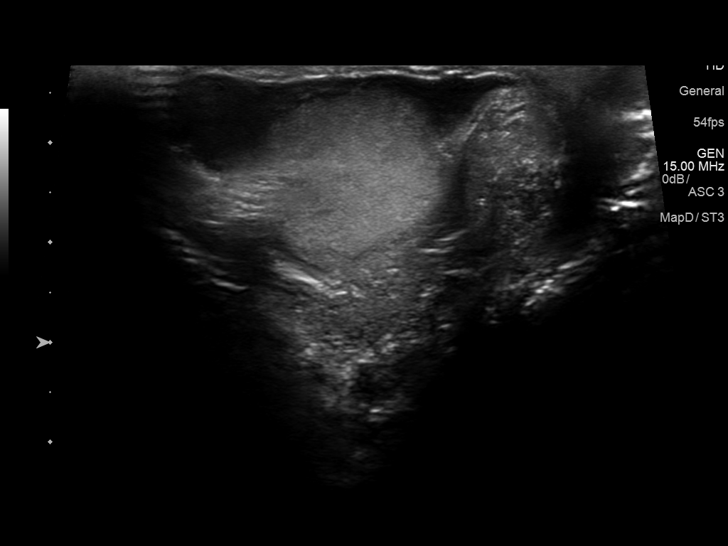
[im 35/77]
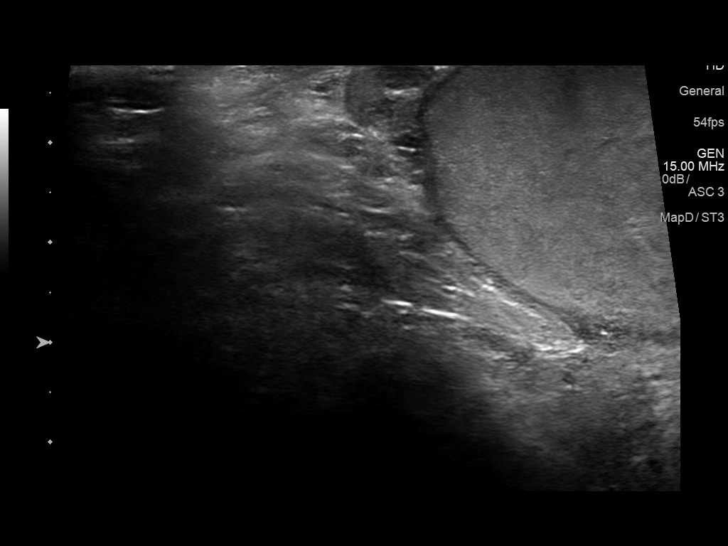
[im 42/77]
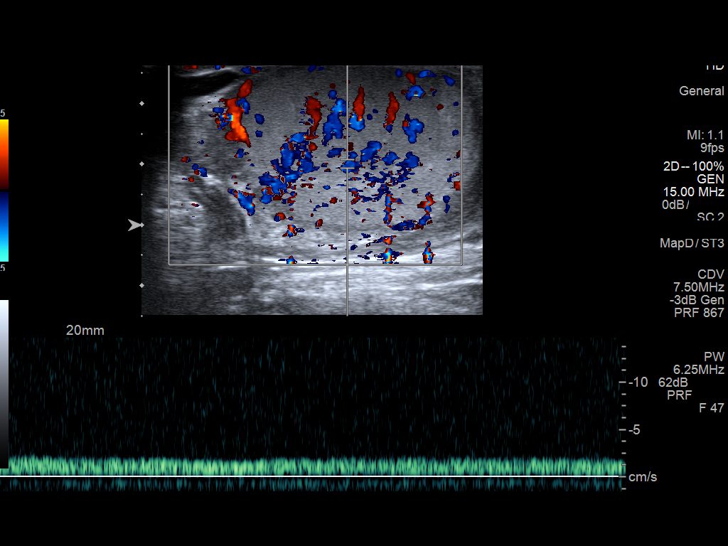
[im 48/77]
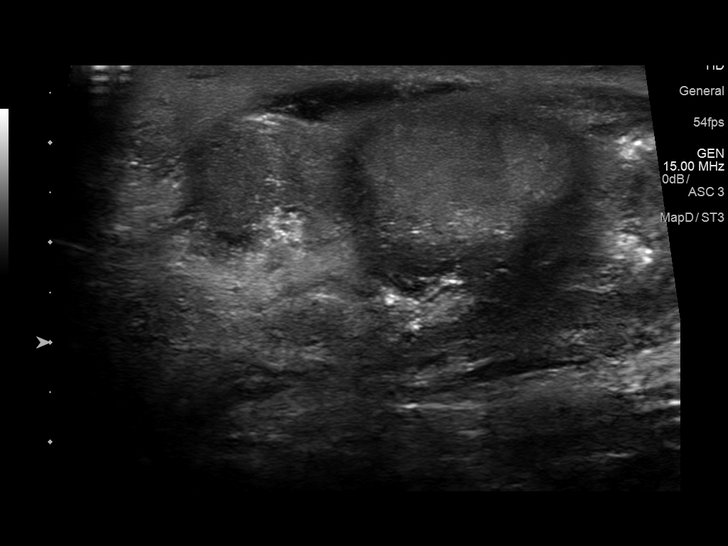
[im 51/77]
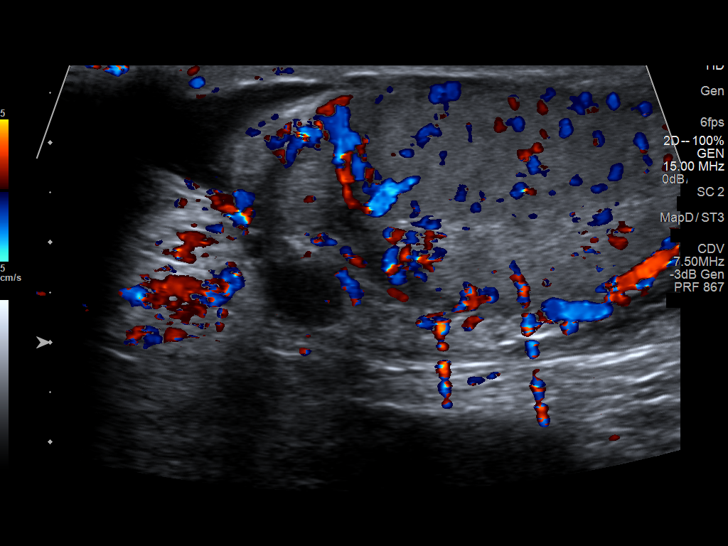
[im 58/77]
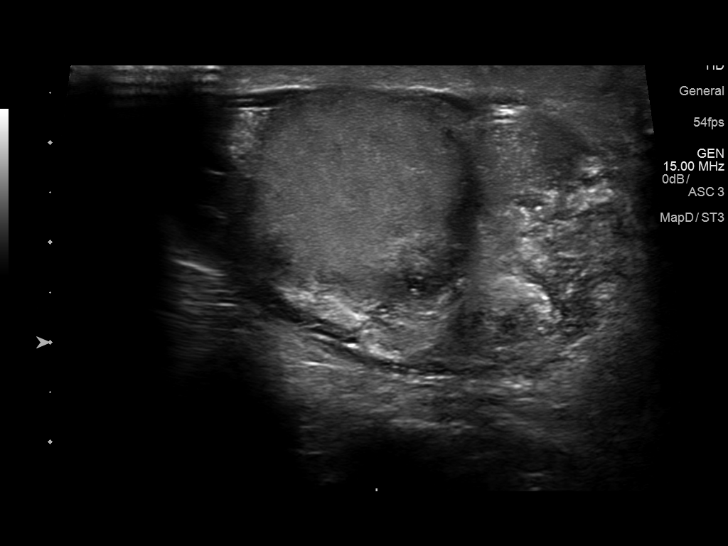
[im 64/77]
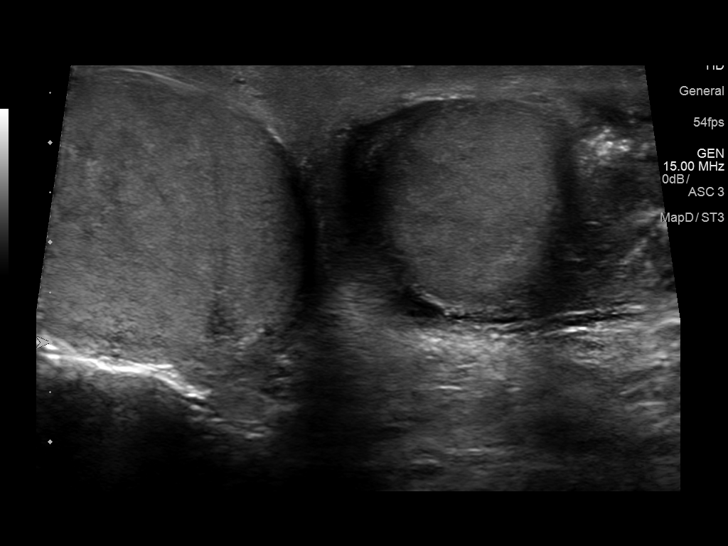
[im 70/77]
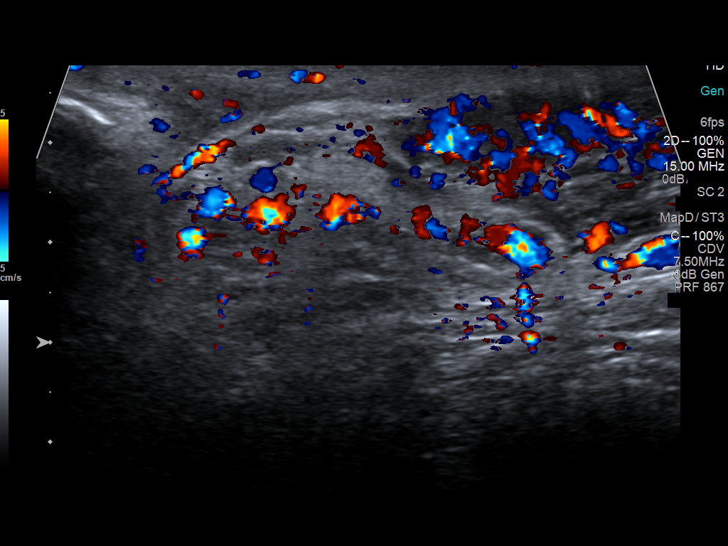
[im 77/77]
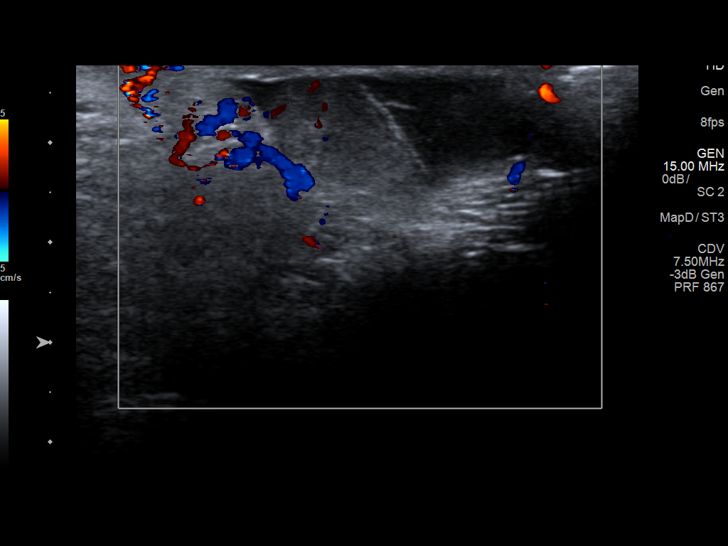

[14 of 25 positions shown; findings below may reference images not displayed]

FINDINGS: Right testicle

Measurements: 4.2 x 3.3 x 3.1 cm. No mass or microlithiasis
visualized.

Left testicle

Measurements: 4.6 x 3.7 x 2.9 cm. No mass or microlithiasis
visualized.

Right epididymis:  Normal in size and appearance.

Left epididymis: Enlarged with increased flow seen on Doppler
suggesting epididymitis.

Hydrocele:  None visualized.

Varicocele:  None visualized.

Pulsed Doppler interrogation of both testes demonstrates normal low
resistance arterial and venous waveforms bilaterally.
IMPRESSION: No evidence of testicular mass or torsion. Probable left
epididymitis.

## 2019-06-13 IMAGING — DX DG CHEST 2V
3 series · 3 of 3 positions shown · non-contrast
Comparison: 07/15/2015.

CLINICAL DATA: Diarrhea, fevers and chills.  Nausea.

EXAM:
CHEST  2 VIEW

[chest lat]
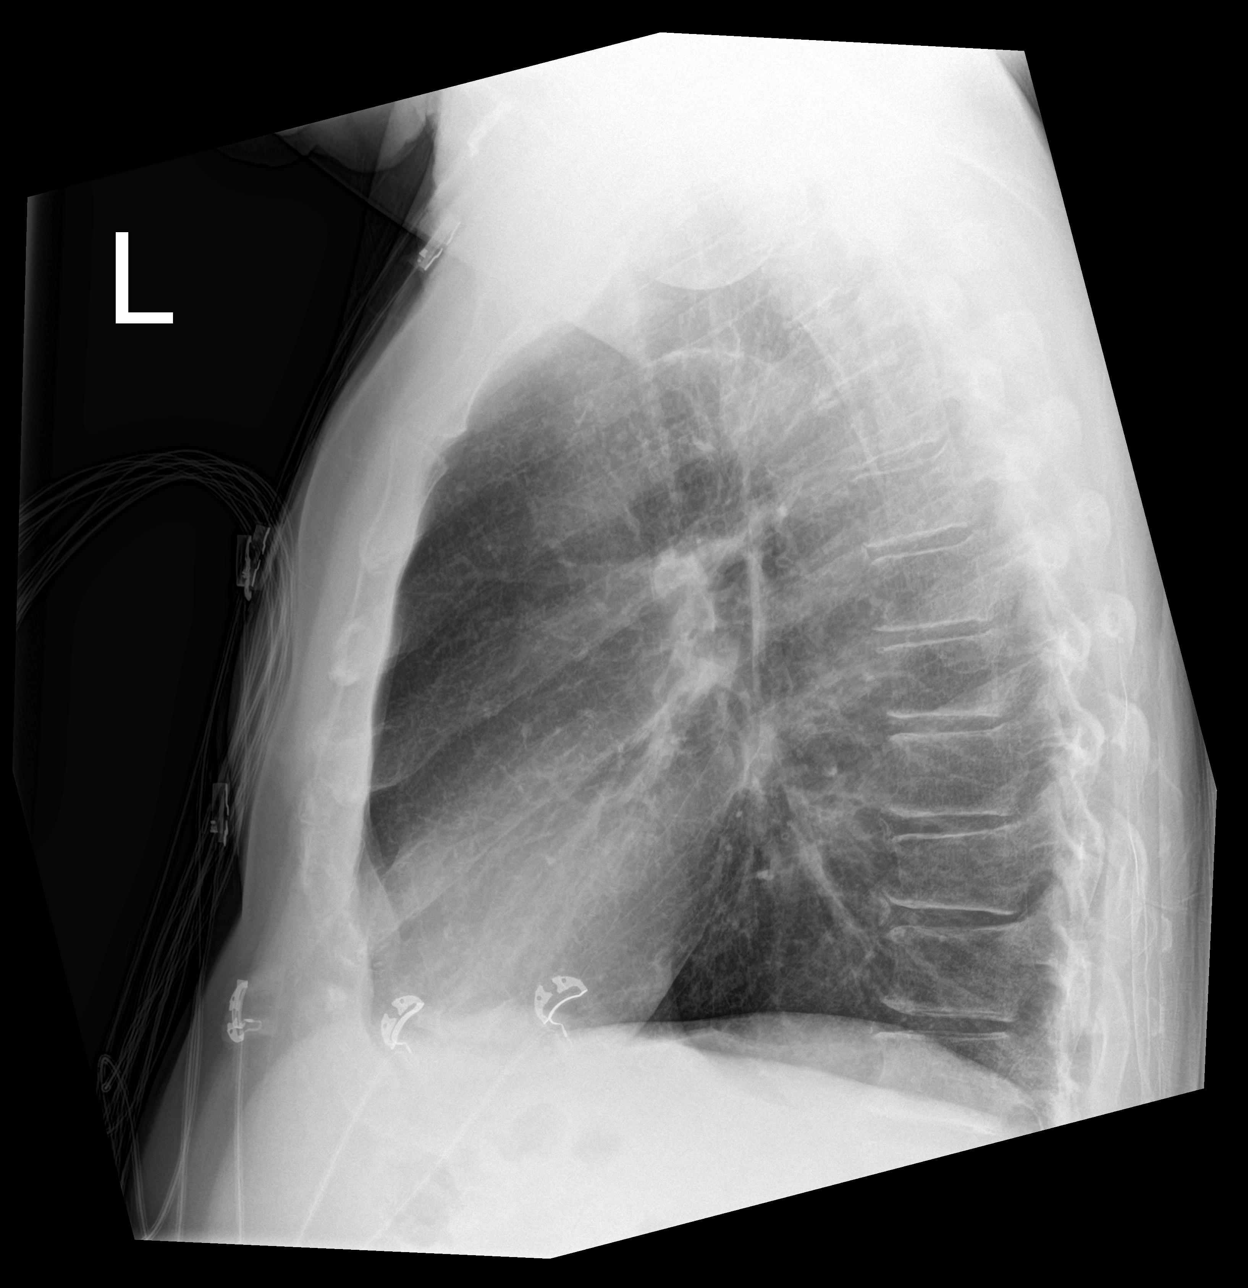

[chest ap strecther (1 of 2)]
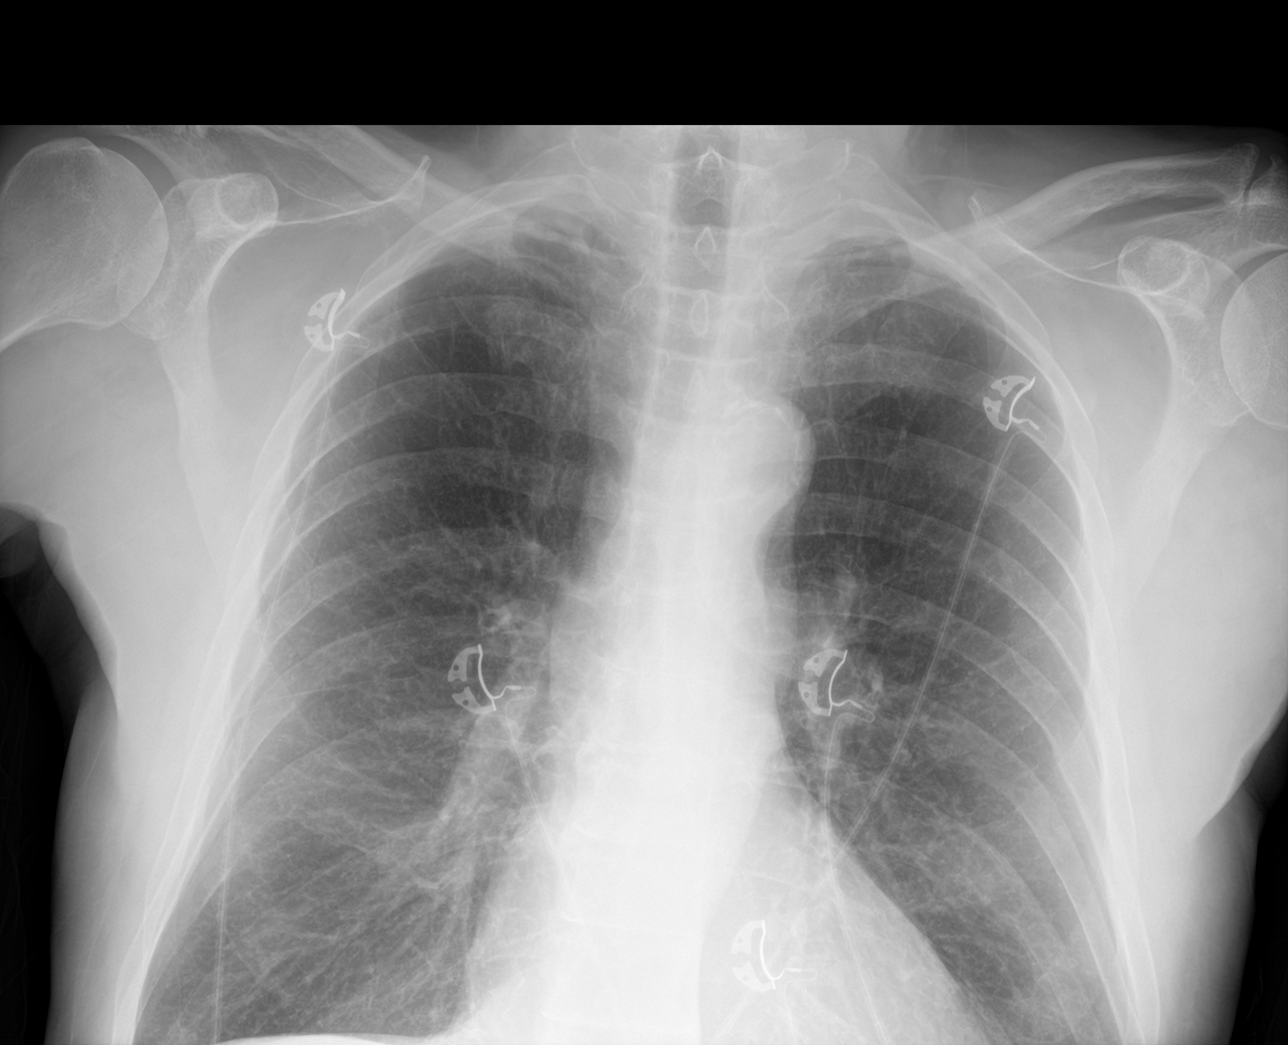

[chest ap strecther (2 of 2)]
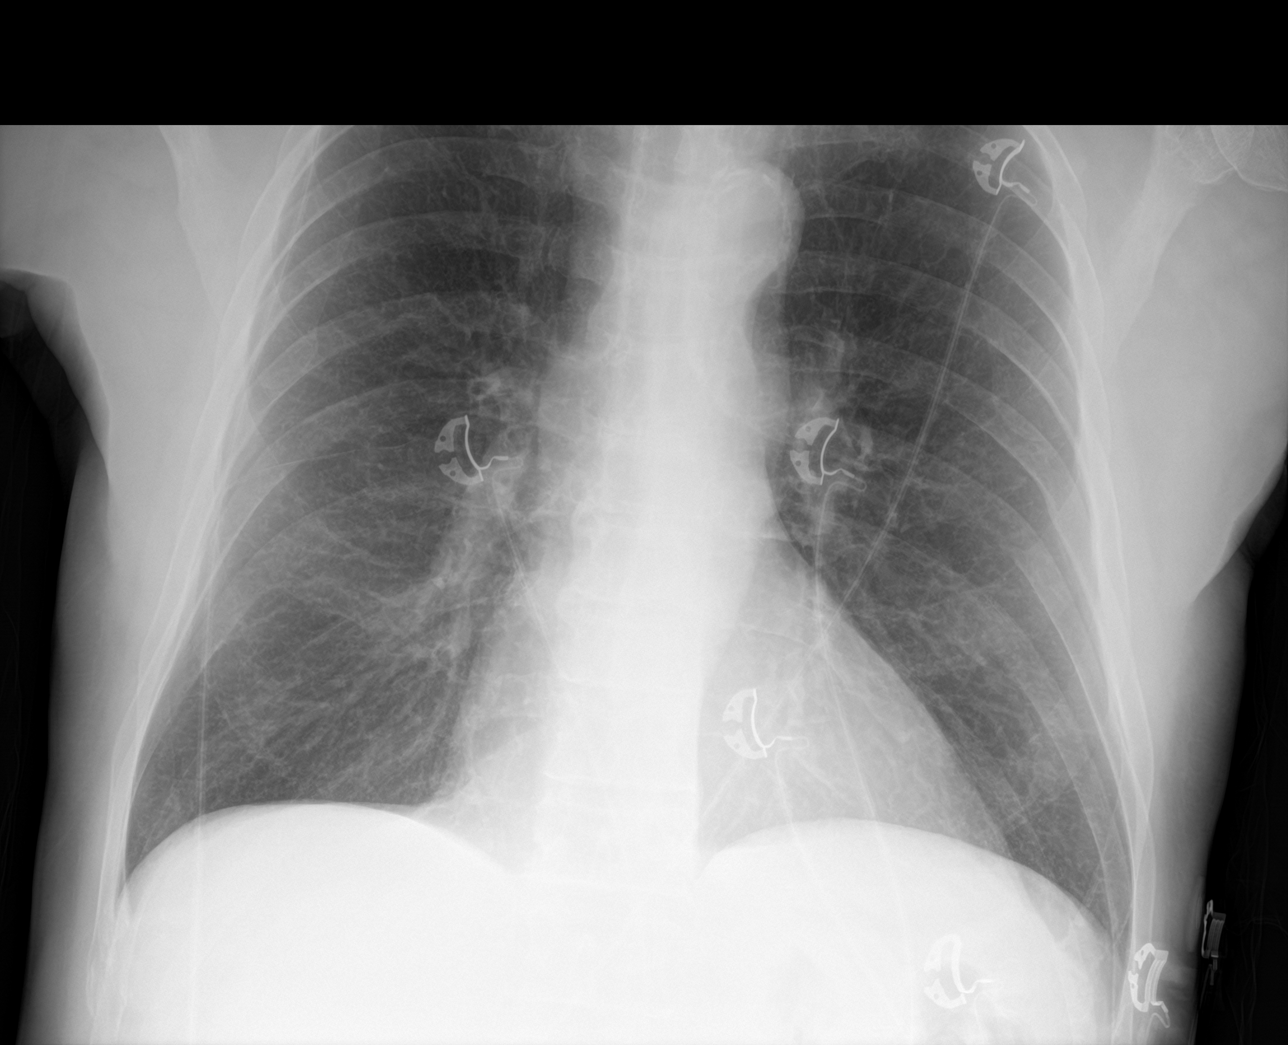

[3 of 3 positions shown; findings below may reference images not displayed]

FINDINGS: Normal sized heart. Clear lungs. The lungs are mildly hyperexpanded
with mildly prominent interstitial markings. Mild thoracic spine
degenerative changes.
IMPRESSION: No acute abnormality.  Stable mild changes of COPD.

## 2019-07-05 ENCOUNTER — Ambulatory Visit: Payer: Medicare Other | Attending: Internal Medicine

## 2019-07-05 DIAGNOSIS — Z23 Encounter for immunization: Secondary | ICD-10-CM

## 2019-07-05 NOTE — Progress Notes (Signed)
   Covid-[facility] Vaccination [facility]  Name:  HILDA STOLLE    MRN: [id number] DOB: October 21, 1955  07/05/2019  Mr. Mcauley was observed post Covid-19 immunization for 15 minutes without incident. He was provided with Vaccine Information Sheet and instruction to access the V-Safe system.   Mr. Aragon was instructed to call 911 with any severe reactions post vaccine: Marland Kitchen Difficulty breathing  . Swelling of face and throat  . A fast heartbeat  . A bad rash all over body  . Dizziness and weakness   Immunizations Administered    Name Date Dose VIS Date Route   Pfizer COVID-19 Vaccine 07/05/2019 12:30 PM 0.3 mL 03/23/2019 Intramuscular   Manufacturer: [place]   Lot: [id number]   Echo: [id number]

## 2019-07-17 ENCOUNTER — Other Ambulatory Visit: Payer: Self-pay | Admitting: *Deleted

## 2019-07-17 DIAGNOSIS — I714 Abdominal aortic aneurysm, without rupture, unspecified: Secondary | ICD-10-CM

## 2019-07-17 DIAGNOSIS — R0989 Other specified symptoms and signs involving the circulatory and respiratory systems: Secondary | ICD-10-CM

## 2019-07-18 ENCOUNTER — Other Ambulatory Visit: Payer: Self-pay

## 2019-07-18 ENCOUNTER — Ambulatory Visit (HOSPITAL_COMMUNITY)
Admission: RE | Admit: 2019-07-18 | Discharge: 2019-07-18 | Disposition: A | Discharge status: Home / Self Care | Payer: Medicare Other | Source: Ambulatory Visit | Attending: Vascular Surgery | Admitting: Vascular Surgery

## 2019-07-18 ENCOUNTER — Ambulatory Visit (INDEPENDENT_AMBULATORY_CARE_PROVIDER_SITE_OTHER)
Admission: RE | Admit: 2019-07-18 | Discharge: 2019-07-18 | Disposition: A | Discharge status: Home / Self Care | Payer: Medicare Other | Source: Ambulatory Visit | Attending: Vascular Surgery | Admitting: Vascular Surgery

## 2019-07-18 ENCOUNTER — Ambulatory Visit (INDEPENDENT_AMBULATORY_CARE_PROVIDER_SITE_OTHER): Payer: Medicare Other | Admitting: Physician Assistant

## 2019-07-18 VITALS — BP 102/67 | HR 70 | Resp 14 | Ht 74.0 in | Wt 163.7 lb

## 2019-07-18 DIAGNOSIS — I714 Abdominal aortic aneurysm, without rupture, unspecified: Secondary | ICD-10-CM

## 2019-07-18 DIAGNOSIS — R0989 Other specified symptoms and signs involving the circulatory and respiratory systems: Secondary | ICD-10-CM | POA: Diagnosis present

## 2019-07-18 DIAGNOSIS — I6523 Occlusion and stenosis of bilateral carotid arteries: Secondary | ICD-10-CM | POA: Diagnosis not present

## 2019-07-18 NOTE — Progress Notes (Signed)
Office Note     CC:  follow up carotid artery disease and AAA Requesting Provider:  [name], MD  HPI: [name] is a 64 y.o. (1955/10/02) male who presents for routine follow-up of carotid artery stenosis and abdominal aortic aneurysm.  He was last evaluated in March 2020 by Dr. Donnetta Hutching.  He is status post left carotid endarterectomy on 08/08/2017 and a remote history of right carotid endarterectomy in 2011.  He denies monocular blindness, slurred speech, facial or extremity weakness.  He denies claudication or rest pain.  He denies postprandial pain or unintentional weight loss.  He continues to smoke cigarettes.  He is compliant with his Aggrenox and Crestor  The pt is on a statin for cholesterol management.  The pt is on Aggrenox.   Other AC:  none The pt is on ARB, diuretic, CCB for hypertension.   The pt is not diabetic.   Tobacco hx:  1 ppd  Past Medical History:  Diagnosis Date  . AAA (abdominal aortic aneurysm) (HCC)    4.6 cm x 4. 5 cm per 05-30-17 [facility] ct referred to dr early to see april 2019  . Anemia   . Artery stenosis (HCC)    L carotid  . Arthritis    BACK  . Back pain   . Complication of anesthesia    WOKE UP DURING COLONSCOPY  . COPD (chronic obstructive pulmonary disease) ([place])   . CVA (cerebral infarction) 05/27/09   TIA, posterior circulation 7/11  . Depression   . Glaucoma    LEFT EYE WORSE  . Groin swelling   . Hemorrhage 1996   intracerebral  . History of kidney stones   . HLD (hyperlipidemia)   . HTN (hypertension)   . ICAO (internal carotid artery occlusion)    right ICA stenosis, s/p R carotid endarterectomy on 06/13/09  . Meningitis    viral; as a child   . Monoclonal gammopathy of unknown significance 02/07/2013   No anemia,no elevation of Iggs,  no m-spike, ?monoclonal protein on IFE 9/14  . PONV (postoperative nausea and vomiting)    NAUSEA  . Seizure disorder ([place])    at time of [place] in 1996. No seizures for quite  awhile  . Seizures ([place])     5 years ago  . Stroke [facility]) March 2011    Past Surgical History:  Procedure Laterality Date  . CAROTID ENDARTERECTOMY Right 06/12/09   cea  . COLONOSCOPY  06/16/01   by Dr. Earle Gell. positive for colonic polyp, non-adenomatous   . COLONOSCOPY WITH PROPOFOL N/A 02/03/2015   Procedure: COLONOSCOPY WITH PROPOFOL;  Surgeon: Garlan Fair, MD;  Location: [facility];  Service: Endoscopy;  Laterality: N/A;  . CYSTOSCOPY/URETEROSCOPY/HOLMIUM LASER/STENT PLACEMENT Left 06/15/2017   Procedure: CYSTOSCOPY/RETROGRADE/URETEROSCOPY/ /STENT PLACEMENT;  Surgeon: Ceasar Mons, MD;  Location: [facility];  Service: Urology;  Laterality: Left;  . ENDARTERECTOMY Left 08/08/2017   Procedure: ENDARTERECTOMY CAROTID LEFT;  Surgeon: Rosetta Posner, MD;  Location: [place];  Service: Vascular;  Laterality: Left;  . GLAUCOMA REPAIR  2016   by taking out cataracts (bilateral)  . PATCH ANGIOPLASTY Left 08/08/2017   Procedure: PATCH ANGIOPLASTY CAROTID LEFT USING HEMASHIELD PATCH;  Surgeon: Rosetta Posner, MD;  Location: [facility];  Service: Vascular;  Laterality: Left;  . R CEA  3/11  . R inguinal hernia repair  07/05/09    Social History   Socioeconomic History  . Marital status: Married  Spouse name: Not on file  . Number of children: 2  . Years of education: 12+  . Highest education level: Not on file  Occupational History  . Occupation: ELECTRICIAN    Employer: [employer]  Tobacco Use  . Smoking status: Current Every Day Smoker    Packs/day: 1.00    Types: Cigarettes  . Smokeless tobacco: Current User    Types: Chew  Substance and Sexual Activity  . 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 Drug use: Not Currently    Types: Marijuana    Comment: MARIJUANA 30 YRS AGO  . Sexual activity: Not on file  Other Topics Concern  . Not on file  Social  History Narrative   Married, lives in [place] with his wife.   2 children (daughter, Janett Billow, is a Marine scientist on [employer] at [employer])    Patient is right handed   Patient has a high school education with some college.   Patient drinks at least 5 cups daily.         Social Determinants of Health   Financial Resource Strain:   . Difficulty of Paying Living Expenses:   Food Insecurity:   . Worried About Charity fundraiser in the Last Year:   . Arboriculturist in the Last Year:   Transportation Needs:   . Film/video editor (Medical):   Marland Kitchen Lack of Transportation (Non-Medical):   Physical Activity:   . Days of Exercise per Week:   . Minutes of Exercise per Session:   Stress:   . Feeling of Stress :   Social Connections:   . Frequency of Communication with Friends and Family:   . Frequency of Social Gatherings with Friends and Family:   . Attends Religious Services:   . Active Member of Clubs or Organizations:   . Attends Archivist Meetings:   Marland Kitchen Marital Status:   Intimate Partner Violence:   . Fear of Current or Ex-Partner:   . Emotionally Abused:   Marland Kitchen Physically Abused:   . Sexually Abused:     Family History  Problem Relation Age of Onset  . Heart disease Father        Heart Disease before age 61  . Heart attack Father   . Heart disease Mother   . Hyperlipidemia Brother   . Hyperlipidemia Sister   . Heart attack Sister        in younger sister    Current Outpatient Medications  Medication Sig Dispense Refill  . ALPRAZolam (XANAX) 0.25 MG tablet Take 0.25 mg by mouth daily as needed.    Marland Kitchen AMLODIPINE-VALSARTAN-HCTZ PO     . budesonide-formoterol (SYMBICORT) 80-4.5 MCG/ACT inhaler Inhale 2 puffs into the lungs 2 (two) times daily as needed (for wheezing).     . carbamazepine (TEGRETOL XR) 400 MG 12 hr tablet Take 400 mg by mouth 3 (three) times daily.    . Cholecalciferol (VITAMIN D) 2000 units tablet Take 2,000 Units by mouth daily.     Marland Kitchen dipyridamole-aspirin  (AGGRENOX) 200-25 MG 12hr capsule Take 1 capsule by mouth 2 (two) times daily. 180 capsule 3  . ferrous sulfate 325 (65 FE) MG tablet Take 325 mg by mouth daily with breakfast.    . levETIRAcetam (KEPPRA XR) 500 MG 24 hr tablet Take 1 tablet (500 mg total) by mouth daily. Take 2 tablets  daily 180 tablet 1  . pantoprazole (PROTONIX) 40 MG tablet     . PANTOPRAZOLE SODIUM PO     . rosuvastatin (CRESTOR) 10 MG tablet     . sertraline (ZOLOFT) 50 MG tablet Take 50 mg by mouth every morning.     . valsartan-hydrochlorothiazide (DIOVAN-HCT) 160-25 MG per tablet Take 0.5 tablets by mouth at bedtime.      No current facility-administered medications for this visit.    Allergies  Allergen Reactions  . Tape Rash    ADHESIVE     REVIEW OF SYSTEMS:   [X]  denotes positive finding, [ ]  denotes negative finding Cardiac  Comments:  Chest pain or chest pressure:    Shortness of breath upon exertion:    Short of breath when lying flat:    Irregular heart rhythm:        Vascular    Pain in calf, thigh, or hip brought on by ambulation:    Pain in feet at night that wakes you up from your sleep:     Blood clot in your veins:    Leg swelling:         Pulmonary    Oxygen at home:    Productive cough:     Wheezing:         Neurologic    Sudden weakness in arms or legs:     Sudden numbness in arms or legs:     Sudden onset of difficulty speaking or slurred speech:    Temporary loss of vision in one eye:     Problems with dizziness:         Gastrointestinal    Blood in stool:     Vomited blood:         Genitourinary    Burning when urinating:     Blood in urine:        Psychiatric    Major depression:         Hematologic    Bleeding problems:    Problems with blood clotting too easily:        Skin    Rashes or ulcers:        Constitutional    Fever or chills:      PHYSICAL EXAMINATION:  Vitals:   07/18/19 1021  Weight: 163 lb 11.2 oz (74.3 kg)  Height: 6\' 2"  (1.88 m)    General:  WDWN in NAD; vital signs documented above Gait: No ataxia.  Walks unaided HENT: WNL, normocephalic Pulmonary: normal non-labored breathing , without Rales, rhonchi,  wheezing Cardiac: regular HR, without  Murmurs without carotid bruits.  Left carotid pulse is pronounced. Abdomen: soft, NT, no masses Skin: without rashes Vascular Exam/Pulses: The patient has palpable 2+ left brachial and radial pulses.  I am unable to palpate his right brachial or radial pulses.  Unable to palpate popliteal or pedal pulses. Extremities: without ischemic changes, without Gangrene , without cellulitis; without open wounds; both feet are warm, pink and well perfused.  Active range of motion.  Intact sensation. Musculoskeletal: no muscle wasting or atrophy  Neurologic: A&O X 3;  No focal weakness or paresthesias are detected Psychiatric:  The pt has Normal affect.  Non-Invasive Vascular Imaging:   Maximal diameter abdominal aorta: 4.89 cm Right CIA: 1.3 cm Left CIA: 1.2 cm Proximal SMA demonstrates a velocity of 382 cm/s suggesting a stenosis of greater than 70%.  Carotid duplex: Left and right internal common carotid artery velocities consistent with approximately 40 to 59% stenosis.  Turbulent right subclavian artery flow.  Right vertebral is antegrade with dampened signal and systolic deceleration.  Multiphasic left subclavian artery flow.  Left vertebral is antegrade.  ASSESSMENT/PLAN:: 64 y.o. male here for follow up for history of carotid artery stenosis status post bilateral carotid endarterectomies.  New stenosis of the left ICA noted.  He is asymptomatic.  Repeat duplex of carotid arteries in 1 year.  History of infrarenal abdominal aorta.  This is increased in size of approximately 0.4 cm.  Stenosis of the SMA noted on ultrasound.  The patient does not endorse symptoms of food fear, postprandial pain or weight loss.  I discussed the findings of the duplex exam of his aorta with Dr.  Scot Dock today.  We will plan to repeat duplex of the aorta in 6 months.  I reviewed signs and symptoms of stroke/TIA with the patient.  Also encouraged him to stop smoking.  Continue medical management.  Barbie Banner, PA-C [facility] [phone]  Clinic MD:   Scot Dock

## 2019-07-19 ENCOUNTER — Other Ambulatory Visit: Payer: Self-pay | Admitting: *Deleted

## 2019-07-19 DIAGNOSIS — I6523 Occlusion and stenosis of bilateral carotid arteries: Secondary | ICD-10-CM

## 2019-07-19 DIAGNOSIS — I714 Abdominal aortic aneurysm, without rupture, unspecified: Secondary | ICD-10-CM

## 2019-07-30 ENCOUNTER — Ambulatory Visit: Payer: Medicare Other | Attending: Internal Medicine

## 2019-07-30 DIAGNOSIS — Z23 Encounter for immunization: Secondary | ICD-10-CM

## 2019-07-30 NOTE — Progress Notes (Signed)
   Covid-19 Vaccination Clinic  Name:  BENNET SPECTOR    MRN: [id number] DOB: Jul 23, 1955  07/30/2019  Mr. Ehrman was observed post Covid-19 immunization for 15 minutes without incident. He was provided with Vaccine Information Sheet and instruction to access the V-Safe system.   Mr. Giovino was instructed to call 911 with any severe reactions post vaccine: Marland Kitchen Difficulty breathing  . Swelling of face and throat  . A fast heartbeat  . A bad rash all over body  . Dizziness and weakness   Immunizations Administered    Name Date Dose VIS Date Route   Pfizer COVID-19 Vaccine 07/30/2019 11:50 AM 0.3 mL 06/06/2018 Intramuscular   Manufacturer: [place]   Lot: [id number]   [place]: [id number]

## 2019-10-03 ENCOUNTER — Other Ambulatory Visit: Payer: Self-pay | Admitting: Neurology

## 2019-11-22 ENCOUNTER — Telehealth: Payer: Self-pay | Admitting: Neurology

## 2019-11-22 MED ORDER — ASPIRIN-DIPYRIDAMOLE ER 25-200 MG PO CP12: 1.0000 | ORAL_CAPSULE | Freq: Two times a day (BID) | ORAL | 3 refills | Status: DC

## 2019-11-22 NOTE — Telephone Encounter (Signed)
Refill sent as requested . Patient has FU in Sept.

## 2019-11-22 NOTE — Telephone Encounter (Signed)
Pt is requesting a refill for dipyridamole-aspirin (AGGRENOX) 200-25 MG 12hr capsule.  Pharmacy: [id number] -

## 2019-11-22 NOTE — Addendum Note (Signed)
Addended by: Florian Buff C on: 11/22/2019 10:10 AM   Modules accepted: Orders

## 2019-12-19 ENCOUNTER — Encounter: Payer: Self-pay | Admitting: Neurology

## 2019-12-19 ENCOUNTER — Ambulatory Visit (INDEPENDENT_AMBULATORY_CARE_PROVIDER_SITE_OTHER): Payer: Medicare Other | Admitting: Neurology

## 2019-12-19 ENCOUNTER — Other Ambulatory Visit: Payer: Self-pay

## 2019-12-19 VITALS — BP 150/88 | HR 66 | Ht 74.0 in | Wt 174.0 lb

## 2019-12-19 DIAGNOSIS — I6990 Unspecified sequelae of unspecified cerebrovascular disease: Secondary | ICD-10-CM

## 2019-12-19 DIAGNOSIS — I6523 Occlusion and stenosis of bilateral carotid arteries: Secondary | ICD-10-CM

## 2019-12-19 DIAGNOSIS — G40009 Localization-related (focal) (partial) idiopathic epilepsy and epileptic syndromes with seizures of localized onset, not intractable, without status epilepticus: Secondary | ICD-10-CM

## 2019-12-19 MED ORDER — LEVETIRACETAM ER 500 MG PO TB24: 1000.0000 mg | ORAL_TABLET | Freq: Every day | ORAL | 3 refills | Status: DC

## 2019-12-19 NOTE — Patient Instructions (Signed)
I had a long discussion with the patient regarding his recent episode of questionable seizure in the setting of dehydration and hypotension which was likely a provoked event hence I do not recommend changing the dose of Keppra and continue Keppra XR 1000 mg daily at the present time.  He was advised to avoid activities which may trigger dehydration and to keep himself well-hydrated and drink plenty of fluids.  He was advised to continue Aggrenox for stroke prevention and maintain aggressive risk factor modification and asked to cut back and quit smoking completely.  Check lipid profile and hemoglobin A1c today.  Keep LDL cholesterol goal below 70 mg percent and diabetes with hemoglobin A1c goal below 6.5% and hypertension with blood pressure goal below 130/90.  Continue conservative follow-up for his carotid stenosis at the vascular surgery clinic.  He will return for follow-up in the future in a year or call earlier if necessary.

## 2019-12-19 NOTE — Progress Notes (Signed)
[facility]  PATIENT: Charles [name] DOB: 1955/04/23   REASON FOR VISIT: Follow-up for seizure disorder, history of stroke , risk factors of smoking hypertension hyperlipidemia HISTORY FROM: Patient    HISTORY OF PRESENT ILLNESS:UPDATE 8/2/2019CM Charles [name], 64 year old [name] returns for follow-up with history of seizure disorder currently well controlled on Tegretol without side effects and no seizures in several years.  Charles [name] also has a history of posterior circulatory TIA in September 2011 and remote brain intracranial hemorrhage in 1996.  Charles [name] has risk factors of hypertension hyperlipidemia smoking, carotid artery disease.  Charles [name] continues to work full-time Charles [name] remains on Aggrenox for secondary stroke prevention without bruising or bleeding Charles [name] has not had further stroke or TIA symptoms.  Charles [name] is also on Crestor without myalgias.  Most recent carotid Doppler done 07/22/2017 with severe stenosis involving the proximal left internal carotid artery measuring greater than 70%.  Extensive atherosclerotic disease in bilateral carotid arteries estimated degree of stenosis in the right is less than 50%..  Patent vertebral arteries with antegrade flow.  Charles [name] had left carotid endarterectomy on 08/08/2017.  Charles [name] also had CT of the abdomen 05/20/2017 which demonstrated 4.6 cm aortic aneurysm.  Charles [name] is followed by Dr. Donnetta Hutching.  Blood pressure in the office today 144/80.  Patient claims Charles [name] has not taken his blood pressure medicines this morning.  Charles [name] returns for reevaluation.  UPDATE 11/11/16 CM Charles [name], 64 year old [name] returns for yearly follow-up. Charles [name] has a history of seizure disorder with no seizures in several years. Charles [name] is currently maintained on Tegretol and Charles [name] claims recent labs were done at Dr. Inda Merlin office. Charles [name] also has a history of posterior circulatory TIA in September 2011, and remote brain intracranial hemorrhage in 1996. Charles [name] has risk factors of smoking hypertension hyperlipidemia. 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 Charles [name] is currently on Aggrenox for secondary stroke prevention without bruising or bleeding. Charles [name] has not had further stroke or TIA symptoms Charles [name] is on Crestor without complaints of myalgias. Blood pressure in the office today 139/81. Charles [name] returns for reevaluation   UPDATE 8/10/17CMMr. [name], 64 year old [name] returns for yearly followup.  .Charles [name] has a history of posterior circulatory TIA in September of 2011 with vascular risk factors of smoking, hypertension, hyperlipidemia, and remote brain intracranial hemorrhage in 1996. Charles [name] also has had a seizure disorder since 1978 following encephalitis with breakthrough seizures occurring following a brain hemorrhage in 1996 currently well controlled on Tegretol. In addition Charles [name] has history of right subcortical infarct in February of 2011 right carotid endarterectomy in March of 2011 Charles [name] was initially started on Plavix but had diarrhea on the drug. Charles [name] was on Aggrenox  however stopped the drug due to the expense of greater than $200 a month. Charles [name] was then  on aspirin now  back on Aggrenox as Charles [name] now has insurance Charles [name] denies further stroke or TIA symptoms since last seen. Carotid duplex with Dr. Kellie Simmering 09/18/2013 with progression left ICA  stenosis.  Dopplers followed by [facility]. 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  Update 12/05/2018 : Charles [name] returns for follow-up after last visit with Charles [name], nurse practitioner a year ago.  Continues to do well without recurrent TIA or stroke symptoms none since 2011.  Charles [name] is not also had any seizures for several years.  Charles [name] remains on Aggrenox which is tolerating well without side effects.  States his blood pressure is under good control and today it is 130/75.  Charles [name] remains on  Crestor which is tolerating well without side effects and states last lipid profile was satisfactory.  Charles [name] continues to smoke and states Charles [name] cannot cut back and stop.  Charles [name] remains on Tegretol-XR 400 mg 4 times daily and had  lab work in April 2018 which showed carbamazepine level of 11.8 and normal white count and BMP.  Charles [name] is worried that his Tegretol-XR is going to being switched to the generic form and whether to be as effective.  Patient had last follow-up carotid ultrasound in January 2020 in Dr. Luther Parody office which showed 50% right ICA stenosis.  Charles [name] plans on having a follow-up appointment soon Update 12/19/2019 : Charles [name] returns for follow-up after last visit a year ago.  Charles [name] states is done well without recurrent TIA or stroke symptoms.  Charles [name] remains on Aggrenox which is tolerating well without side effects.  States his blood pressure usually runs pretty good though it is slightly elevated in office today at 150/88.  Charles [name] is not sure when Charles [name] last checked his lipids or A1c.  Patient states Charles [name] had not had a unprovoked seizure for several years and a few months ago successfully transition from Tegretol XR which Charles [name] was not able to afford due to insurance change to Keppra XR 1000 mg daily which Charles [name] is able to afford.  Charles [name] probably had a provoked seizure a few months ago when Charles [name] was working outdoors on a trailer and became dehydrated and was seen in the ER in [facility].  His blood pressure was found to be low in the 60s.  I do not have access to those records but apparently his medications were not changed Charles [name] was just given IV fluids and discharge.  Is done well since then without recurrence strokes TIAs or seizures.  Charles [name] continues to smoke but now seems agreeable to cut back.  Charles [name] had follow-up carotid ultrasound done on 07/18/2019 and Dr. Luther Parody office which showed bilateral for 40 to Charles% carotid stenosis which appeared worse from previous study from January 2020. REVIEW OF SYSTEMS: Full 14 system review of systems performed and notable only for dehydration, seizure, bruising, all others are neg:  No complaints today ALLERGIES: Allergies  Allergen Reactions  . Tape Rash    ADHESIVE    HOME MEDICATIONS: Outpatient Medications  Prior to Visit  Medication Sig Dispense Refill  . albuterol (VENTOLIN HFA) 108 (90 Base) MCG/ACT inhaler Inhale 2 puffs into the lungs every 4 (four) hours as needed.    . ALPRAZolam (XANAX) 0.25 MG tablet Take 0.25 mg by mouth daily as needed.    Marland Kitchen AMLODIPINE-VALSARTAN-HCTZ PO     . budesonide-formoterol (SYMBICORT) 80-4.5 MCG/ACT inhaler Inhale 2 puffs into the lungs 2 (two) times daily as needed (for wheezing).     . Cholecalciferol (VITAMIN D) 2000 units tablet Take 2,000 Units by mouth daily.     Marland Kitchen dipyridamole-aspirin (AGGRENOX) 200-25 MG 12hr capsule Take 1 capsule by mouth 2 (two) times daily. 180 capsule 3  . pantoprazole (PROTONIX) 40 MG tablet     . PANTOPRAZOLE SODIUM PO     . rosuvastatin (CRESTOR) 10 MG tablet     . sertraline (ZOLOFT) 50 MG tablet Take 50 mg by mouth every morning.     . testosterone cypionate (DEPOTESTOSTERONE CYPIONATE) 200 MG/ML injection Inject 200 mg into the muscle every 14 (fourteen) days.    . valsartan-hydrochlorothiazide (DIOVAN-HCT) 160-25 MG per tablet Take 0.5 tablets by mouth at bedtime.     . levETIRAcetam (KEPPRA XR) 500  MG 24 hr tablet TAKE 2 TABLETS BY MOUTH DAILY 180 tablet 1  . carbamazepine (TEGRETOL XR) 400 MG 12 hr tablet Take 400 mg by mouth 3 (three) times daily.    . ferrous sulfate 325 (65 FE) MG tablet Take 325 mg by mouth daily with breakfast.     No facility-administered medications prior to visit.    PAST MEDICAL HISTORY: Past Medical History:  Diagnosis Date  . AAA (abdominal aortic aneurysm) (HCC)    4.6 cm x 4. 5 cm per 05-30-17 [facility] ct referred to dr early to see april 2019  . Anemia   . Artery stenosis (HCC)    L carotid  . Arthritis    BACK  . Back pain   . Complication of anesthesia    WOKE UP DURING COLONSCOPY  . COPD (chronic obstructive pulmonary disease) ([place])   . CVA (cerebral infarction) 05/27/09   TIA, posterior circulation 7/11  . Depression   . Glaucoma    LEFT EYE WORSE  . Groin swelling    . Hemorrhage 1996   intracerebral  . History of kidney stones   . HLD (hyperlipidemia)   . HTN (hypertension)   . ICAO (internal carotid artery occlusion)    right ICA stenosis, s/p R carotid endarterectomy on 06/13/09  . Meningitis    viral; as a child   . Monoclonal gammopathy of unknown significance 02/07/2013   No anemia,no elevation of Iggs,  no m-spike, ?monoclonal protein on IFE 9/14  . PONV (postoperative nausea and vomiting)    NAUSEA  . Seizure disorder ([place])    at time of [place] in 1996. No seizures for quite awhile  . Seizures ([place])     5 years ago  . Stroke [facility]) March 2011    PAST SURGICAL HISTORY: Past Surgical History:  Procedure Laterality Date  . CAROTID ENDARTERECTOMY Right 06/12/09   cea  . COLONOSCOPY  06/16/01   by Dr. Earle Gell. positive for colonic polyp, non-adenomatous   . COLONOSCOPY WITH PROPOFOL N/A 02/03/2015   Procedure: COLONOSCOPY WITH PROPOFOL;  Surgeon: Garlan Fair, MD;  Location: [facility];  Service: Endoscopy;  Laterality: N/A;  . CYSTOSCOPY/URETEROSCOPY/HOLMIUM LASER/STENT PLACEMENT Left 06/15/2017   Procedure: CYSTOSCOPY/RETROGRADE/URETEROSCOPY/ /STENT PLACEMENT;  Surgeon: Ceasar Mons, MD;  Location: [facility];  Service: Urology;  Laterality: Left;  . ENDARTERECTOMY Left 08/08/2017   Procedure: ENDARTERECTOMY CAROTID LEFT;  Surgeon: Rosetta Posner, MD;  Location: [place];  Service: Vascular;  Laterality: Left;  . GLAUCOMA REPAIR  2016   by taking out cataracts (bilateral)  . PATCH ANGIOPLASTY Left 08/08/2017   Procedure: PATCH ANGIOPLASTY CAROTID LEFT USING HEMASHIELD PATCH;  Surgeon: Rosetta Posner, MD;  Location: [place];  Service: Vascular;  Laterality: Left;  . R CEA  3/11  . R inguinal hernia repair  07/05/09    FAMILY HISTORY: Family History  Problem Relation Age of Onset  . Heart disease Father        Heart Disease before age 44  . Heart attack Father   . Heart disease Mother   . Hyperlipidemia  Brother   . Hyperlipidemia Sister   . Heart attack Sister        in younger sister    SOCIAL HISTORY: Social History   Socioeconomic History  . Marital status: Married    Spouse name: Not on file  . Number of children: 2  . Years of education: 12+  . Highest education level: Not on  file  Occupational History  . Occupation: ELECTRICIAN    Employer: [employer]  Tobacco Use  . Smoking status: Current Every Day Smoker    Packs/day: 1.00    Types: Cigarettes  . Smokeless tobacco: Current User    Types: Chew  Vaping Use  . Vaping Use: Former  Substance and Sexual Activity  . 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 Drug use: Not Currently    Types: Marijuana    Comment: MARIJUANA 30 YRS AGO  . Sexual activity: Not on file  Other Topics Concern  . Not on file  Social History Narrative   Married, lives in [place] with his wife.   2 children (daughter, Janett Billow, is a Marine scientist on [employer] at [employer])    Patient is right handed   Patient has a high school education with some college.   Patient drinks at least 5 cups daily.         Social Determinants of Health   Financial Resource Strain:   . Difficulty of Paying Living Expenses: Not on file  Food Insecurity:   . Worried About Charity fundraiser in the Last Year: Not on file  . Ran Out of Food in the Last Year: Not on file  Transportation Needs:   . Lack of Transportation (Medical): Not on file  . Lack of Transportation (Non-Medical): Not on file  Physical Activity:   . Days of Exercise per Week: Not on file  . Minutes of Exercise per Session: Not on file  Stress:   . Feeling of Stress : Not on file  Social Connections:   . Frequency of Communication with Friends and Family: Not on file  . Frequency of Social Gatherings with Friends and Family: Not on file  . Attends Religious Services: Not on file  . Active Member of Clubs or  Organizations: Not on file  . Attends Archivist Meetings: Not on file  . Marital Status: Not on file  Intimate Partner Violence:   . Fear of Current or Ex-Partner: Not on file  . Emotionally Abused: Not on file  . Physically Abused: Not on file  . Sexually Abused: Not on file     PHYSICAL EXAM  Vitals:   12/19/19 1411  BP: (!) 150/88  Pulse: 66  Weight: 174 lb (78.9 kg)  Height: 6\' 2"  (1.88 m)   Body mass index is 22.34 kg/m. General: well developed, well nourished middle aged 45 [name], seated, in no evident distress  Head: head normocephalic and atraumatic.   Neck: supple with bilateral soft carotid bruits right greater than left.Bilateral  CEA scars.  Cardiovascular: regular rate and rhythm, no murmurs  Neurologic Exam  Mental Status: Awake and fully alert. Oriented to place and time. Follows all commands. Speech and language normal.  Cranial Nerves: Pupils equal, briskly reactive to light. Extraocular movements full without nystagmus. Visual fields full to confrontation. Hearing intact and symmetric to finger snap. Facial sensation intact. Face, tongue, palate move normally and symmetrically. Neck flexion and extension normal.  Motor: Normal bulk and tone. Normal strength in all tested extremity muscles.No focal weakness  Sensory.: intact to touch and pinprick and vibratory.  Coordination: Rapid alternating movements normal in all extremities. Finger-to-nose and heel-to-shin performed accurately bilaterally.  Gait and Station: Arises from chair without difficulty. Stance is normal. Gait demonstrates normal stride length and balance .  Able to heel, toe and tandem walk with mild difficulty.  Reflexes: 2+ and symmetric. Toes downgoing.  DIAGNOSTIC DATA (LABS, IMAGING, TESTING)  ASSESSMENT AND PLAN  64 y.o. year old [name]  has a past medical history of Hemorrhage (1996); CVA (cerebral infarction) (05/27/09); HTN (hypertension); HLD (hyperlipidemia);  ICAO (internal carotid artery occlusion); Artery stenosis; Seizure disorder; Meningitis; Stroke (March 2011); Myocardial infarction (March 2011); and Monoclonal gammopathy of unknown significance (02/07/2013). here to follow-up. Charles [name] has not had stroke or TIA symptoms. Charles [name] has not had any seizure activity in several years.  Most recent seizure was likely provoked by hypotension and dehydration few months ago.  Charles [name] has tolerated switching from Tegretol to Keppra.  PLAN: I had a long discussion with the patient regarding his recent episode of questionable seizure in the setting of dehydration and hypotension which was likely a provoked event hence I do not recommend changing the dose of Keppra and continue Keppra XR 1000 mg daily at the present time.  Charles [name] was advised to avoid activities which may trigger dehydration and to keep himself well-hydrated and drink plenty of fluids.  Charles [name] was advised to continue Aggrenox for stroke prevention and maintain aggressive risk factor modification and asked to cut back and quit smoking completely.  Check lipid profile and hemoglobin A1c today.  Keep LDL cholesterol goal below 70 mg percent and diabetes with hemoglobin A1c goal below 6.5% and hypertension with blood pressure goal below 130/90.  Continue conservative follow-up for his carotid stenosis at the vascular surgery clinic.  Charles [name] will return for follow-up in the future in a year or call earlier if necessary.  Greater than 50% time during this 25-minute follow-up visit was spent on counseling and coordination of care about his carotid stenosis, TIAs risk and seizures and answering questions followup in the future with me in 1 year Antony Contras, MD [facility] [address], [place], [place] [address] [phone]

## 2019-12-20 ENCOUNTER — Telehealth: Payer: Self-pay

## 2019-12-20 LAB — LIPID PANEL
Chol/HDL Ratio: 4.8 ratio (ref 0.0–5.0)
Cholesterol, Total: 116 mg/dL (ref 100–199)
HDL: 24 mg/dL — ABNORMAL LOW (ref >39)
LDL Chol Calc (NIH): 76 mg/dL (ref 0–99)
Triglycerides: 80 mg/dL (ref 0–149)
VLDL Cholesterol Cal: 16 mg/dL (ref 5–40)

## 2019-12-20 LAB — HEMOGLOBIN A1C
Est. average glucose Bld gHb Est-mCnc: 103 mg/dL
Hgb A1c MFr Bld: 5.2 % (ref 4.8–5.6)

## 2019-12-20 NOTE — Telephone Encounter (Signed)
-----   Message from Garvin Fila, MD sent at 12/20/2019  4:34 PM EDT ----- Mitchell Heir inform the patient that lab work is mostly fine.  Bad cholesterol and screening lab work for diabetes is acceptable.  No medication change necessary at this time

## 2019-12-20 NOTE — Progress Notes (Signed)
Kindly inform the patient that lab work is mostly fine.  Bad cholesterol and screening lab work for diabetes is acceptable.  No medication change necessary at this time

## 2019-12-20 NOTE — Telephone Encounter (Signed)
Attempted to call pt, LVM for normal results per DPR. Ask pt to call back for questions or concerns.  

## 2020-02-01 ENCOUNTER — Other Ambulatory Visit: Payer: Self-pay

## 2020-02-01 DIAGNOSIS — I714 Abdominal aortic aneurysm, without rupture, unspecified: Secondary | ICD-10-CM

## 2020-02-05 ENCOUNTER — Other Ambulatory Visit: Payer: Self-pay | Admitting: Urology

## 2020-02-05 ENCOUNTER — Other Ambulatory Visit (HOSPITAL_COMMUNITY): Payer: Self-pay | Admitting: Urology

## 2020-02-05 DIAGNOSIS — N500 Atrophy of testis: Secondary | ICD-10-CM

## 2020-02-06 ENCOUNTER — Ambulatory Visit (HOSPITAL_COMMUNITY)
Admission: RE | Admit: 2020-02-06 | Discharge: 2020-02-06 | Disposition: A | Discharge status: Home / Self Care | Payer: Medicare Other | Source: Ambulatory Visit | Attending: Vascular Surgery | Admitting: Vascular Surgery

## 2020-02-06 ENCOUNTER — Ambulatory Visit (INDEPENDENT_AMBULATORY_CARE_PROVIDER_SITE_OTHER): Payer: Medicare Other | Admitting: Physician Assistant

## 2020-02-06 ENCOUNTER — Other Ambulatory Visit: Payer: Self-pay

## 2020-02-06 VITALS — BP 117/66 | HR 100 | Temp 98.2°F | Resp 20 | Ht 74.0 in | Wt 156.3 lb

## 2020-02-06 DIAGNOSIS — I714 Abdominal aortic aneurysm, without rupture, unspecified: Secondary | ICD-10-CM

## 2020-02-06 DIAGNOSIS — I6523 Occlusion and stenosis of bilateral carotid arteries: Secondary | ICD-10-CM

## 2020-02-06 NOTE — Progress Notes (Addendum)
Office Note     CC:  follow up Requesting Provider:  [name], MD  HPI: [name] is a 64 y.o. (Jun 13, 1955) male who presents for 6 month follow-up of AAA which had increased in size by 0.4 cm over the course of approximately 13 months.  Today, he complains of drops in his blood pressure and was complaining of dizziness and near syncope.  He was evaluated in the emergency department at 1 September and was treated for dehydration and released.  At that time he said an EKG was performed and was not diagnosed with a cardiac condition.  Currently, he is complaining of substernal chest pain when he lays down at night.  He says this wakes him and keeps him from sleeping.  He gets up and takes several aspirin and is able to go back to sleep.  He also says he has had left upper arm pain and left flank pain associated with this.  He is complaining of early satiety and mild abdominal pain with meals.  He has had approximately 15 pound weight loss over the last 3 months.  He denies vomiting, hematemesis, hematochezia, shortness of breath, exertional chest pain, hematuria.  He reports a history of kidney stones and peptic ulcer in the past.  He also has a history of carotid artery stenosis.  He was last evaluated in March 2020 by Dr. Donnetta Hutching.  He is status post left carotid endarterectomy on 08/08/2017 and a remote history of right carotid endarterectomy in 2011. His duplex ultrasound in April also suggested that the proximal SMA demostrates elevated velocities suggesting a stenosis of >70%.  He denies monocular blindness, slurred speech, facial or extremity weakness.  He denies claudication or rest pain.  He continues to smoke cigarettes.  He is compliant with his Aggrenox and Crestor and PPI.  Recent lipid panel and hemoglobin A1c reviewed.  Values within normal limits with low HDL.  The pt is on a statin for cholesterol management.  The pt is on Aggrenox.   Other AC:  none The pt is on ARB,  diuretic, CCB for hypertension.   The pt is not diabetic.   Tobacco hx:  1 ppd   Past Medical History:  Diagnosis Date  . AAA (abdominal aortic aneurysm) (HCC)    4.6 cm x 4. 5 cm per 05-30-17 [facility] ct referred to dr early to see april 2019  . Anemia   . Artery stenosis (HCC)    L carotid  . Arthritis    BACK  . Back pain   . Complication of anesthesia    WOKE UP DURING COLONSCOPY  . COPD (chronic obstructive pulmonary disease) ([place])   . CVA (cerebral infarction) 05/27/09   TIA, posterior circulation 7/11  . Depression   . Glaucoma    LEFT EYE WORSE  . Groin swelling   . Hemorrhage 1996   intracerebral  . History of kidney stones   . HLD (hyperlipidemia)   . HTN (hypertension)   . ICAO (internal carotid artery occlusion)    right ICA stenosis, s/p R carotid endarterectomy on 06/13/09  . Meningitis    viral; as a child   . Monoclonal gammopathy of unknown significance 02/07/2013   No anemia,no elevation of Iggs,  no m-spike, ?monoclonal protein on IFE 9/14  . PONV (postoperative nausea and vomiting)    NAUSEA  . Seizure disorder ([place])    at time of [place] in 1996. No seizures for quite awhile  . Seizures ([place])  5 years ago  . Stroke [facility]) March 2011    Past Surgical History:  Procedure Laterality Date  . CAROTID ENDARTERECTOMY Right 06/12/09   cea  . COLONOSCOPY  06/16/01   by Dr. Earle Gell. positive for colonic polyp, non-adenomatous   . COLONOSCOPY WITH PROPOFOL N/A 02/03/2015   Procedure: COLONOSCOPY WITH PROPOFOL;  Surgeon: Garlan Fair, MD;  Location: [facility];  Service: Endoscopy;  Laterality: N/A;  . CYSTOSCOPY/URETEROSCOPY/HOLMIUM LASER/STENT PLACEMENT Left 06/15/2017   Procedure: CYSTOSCOPY/RETROGRADE/URETEROSCOPY/ /STENT PLACEMENT;  Surgeon: Ceasar Mons, MD;  Location: [facility];  Service: Urology;  Laterality: Left;  . ENDARTERECTOMY Left 08/08/2017   Procedure: ENDARTERECTOMY CAROTID LEFT;  Surgeon: Rosetta Posner, MD;  Location: [place];  Service: Vascular;  Laterality: Left;  . GLAUCOMA REPAIR  2016   by taking out cataracts (bilateral)  . PATCH ANGIOPLASTY Left 08/08/2017   Procedure: PATCH ANGIOPLASTY CAROTID LEFT USING HEMASHIELD PATCH;  Surgeon: Rosetta Posner, MD;  Location: [facility];  Service: Vascular;  Laterality: Left;  . R CEA  3/11  . R inguinal hernia repair  07/05/09    Social History   Socioeconomic History  . Marital status: Married    Spouse name: Not on file  . Number of children: 2  . Years of education: 12+  . Highest education level: Not on file  Occupational History  . Occupation: ELECTRICIAN    Employer: [employer]  Tobacco Use  . Smoking status: Current Every Day Smoker    Packs/day: 1.00    Types: Cigarettes  . Smokeless tobacco: Current User    Types: Chew  Vaping Use  . Vaping Use: Former  Substance and Sexual Activity  . 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 Drug use: Not Currently    Types: Marijuana    Comment: MARIJUANA 30 YRS AGO  . Sexual activity: Not on file  Other Topics Concern  . Not on file  Social History Narrative   Married, lives in [place] with his wife.   2 children (daughter, Janett Billow, is a Marine scientist on [address] at [employer])    Patient is right handed   Patient has a high school education with some college.   Patient drinks at least 5 cups daily.         Social Determinants of Health   Financial Resource Strain:   . Difficulty of Paying Living Expenses: Not on file  Food Insecurity:   . Worried About Charity fundraiser in the Last Year: Not on file  . Ran Out of Food in the Last Year: Not on file  Transportation Needs:   . Lack of Transportation (Medical): Not on file  . Lack of Transportation (Non-Medical): Not on file  Physical Activity:   . Days of Exercise per Week: Not on file  . Minutes of Exercise per Session: Not on file    Stress:   . Feeling of Stress : Not on file  Social Connections:   . Frequency of Communication with Friends and Family: Not on file  . Frequency of Social Gatherings with Friends and Family: Not on file  . Attends Religious Services: Not on file  . Active Member of Clubs or Organizations: Not on file  . Attends Archivist Meetings: Not on file  . Marital Status: Not on file  Intimate Partner Violence:   .  Fear of Current or Ex-Partner: Not on file  . Emotionally Abused: Not on file  . Physically Abused: Not on file  . Sexually Abused: Not on file   Family History  Problem Relation Age of Onset  . Heart disease Father        Heart Disease before age 50  . Heart attack Father   . Heart disease Mother   . Hyperlipidemia Brother   . Hyperlipidemia Sister   . Heart attack Sister        in younger sister    Current Outpatient Medications  Medication Sig Dispense Refill  . albuterol (VENTOLIN HFA) 108 (90 Base) MCG/ACT inhaler Inhale 2 puffs into the lungs every 4 (four) hours as needed.    . ALPRAZolam (XANAX) 0.25 MG tablet Take 0.25 mg by mouth daily as needed.    Marland Kitchen AMLODIPINE-VALSARTAN-HCTZ PO     . B-D 3CC LUER-LOK SYR 21GX1" 21G X 1" 3 ML MISC     . budesonide-formoterol (SYMBICORT) 80-4.5 MCG/ACT inhaler Inhale 2 puffs into the lungs 2 (two) times daily as needed (for wheezing).     . Cholecalciferol (VITAMIN D) 2000 units tablet Take 2,000 Units by mouth daily.     Marland Kitchen dipyridamole-aspirin (AGGRENOX) 200-25 MG 12hr capsule Take 1 capsule by mouth 2 (two) times daily. 180 capsule 3  . levETIRAcetam (KEPPRA XR) 500 MG 24 hr tablet Take 2 tablets (1,000 mg total) by mouth daily. 180 tablet 3  . pantoprazole (PROTONIX) 40 MG tablet     . rosuvastatin (CRESTOR) 10 MG tablet     . sertraline (ZOLOFT) 50 MG tablet Take 50 mg by mouth every morning.     . testosterone cypionate (DEPOTESTOSTERONE CYPIONATE) 200 MG/ML injection Inject 200 mg into the muscle every 14  (fourteen) days.     No current facility-administered medications for this visit.    Allergies  Allergen Reactions  . Tape Rash    ADHESIVE     REVIEW OF SYSTEMS:   [X]  denotes positive finding, [ ]  denotes negative finding Cardiac  Comments:  Chest pain or chest pressure: x   Shortness of breath upon exertion:    Short of breath when lying flat:    Irregular heart rhythm:        Vascular    Pain in calf, thigh, or hip brought on by ambulation:    Pain in feet at night that wakes you up from your sleep:     Blood clot in your veins:    Leg swelling:         Pulmonary    Oxygen at home:    Productive cough:     Wheezing:         Neurologic    Sudden weakness in arms or legs:     Sudden numbness in arms or legs:     Sudden onset of difficulty speaking or slurred speech:    Temporary loss of vision in one eye:     Problems with dizziness:         Gastrointestinal    Blood in stool:     Vomited blood:         Genitourinary    Burning when urinating:     Blood in urine:        Psychiatric    Major depression:         Hematologic    Bleeding problems:    Problems with blood clotting too easily:  Skin    Rashes or ulcers:        Constitutional    Fever or chills:      PHYSICAL EXAMINATION:  Vitals:   02/06/20 0833  BP: 117/66  Pulse: 100  Resp: 20  Temp: 98.2 F (36.8 C)  SpO2: 97%   General:  WDWN in NAD; vital signs documented above Gait: inaided, no ataxia HENT: WNL, normocephalic Pulmonary: normal non-labored breathing , without Rales, rhonchi,  wheezing Cardiac: regular HR, without  Murmurs without carotid bruit Abdomen: soft, NT, pulsatile  Skin: with rashes Vascular Exam/Pulses: 2+ radial and DP pulses bilaterally Extremities: without ischemic changes, without Gangrene , without cellulitis; without open wounds;  Musculoskeletal: no muscle wasting or atrophy  Neurologic: A&O X 3;  No focal weakness or paresthesias are  detected Psychiatric:  The pt has Normal affect.   Non-Invasive Vascular Imaging:   Summary:  Abdominal Aorta: There is evidence of abnormal dilatation of the mid and  distal Abdominal aorta. The largest aortic diameter remains essentially  unchanged compared to prior exam. Previous diameter measurement was 4.83 x  4.89 cm obtained on 07/18/19.   Stenosis: +-----------------+-------------+--------+  Location     Stenosis   Comments  +-----------------+-------------+--------+  SMA       >70% stenosis304 cm/s  +-----------------+-------------+--------+  Left Common Iliac>50% stenosis331 cm/s  +-----------------+-------------+--------+     ASSESSMENT/PLAN:: 64 y.o. male here for follow up for infrarenal abdominal aortic aneurysm. Maximal diameter remains essentially unchanged at 4.9 cm. Follow-up duplex of aorta in 6 months. Again, encouraged smoking cessation. Continue statin.  He is on Aggrenox and will defer to PCP regarding continuation of this in light of current GI symptomology.  Bilateral carotid artery stenosis: asymptomatic. Follow-up carotid artery duplex in 6 months.  We reviewed signs and sx of stroke and advised to seek immediate medical attention should these occur.  SMA stenosis: Recent weight loss and early satiety, vague abdominal discomfort.  Rec: GI eval and monitoring.   Left CIA stenosis: No LE symptoms and has palpable pedal pulses. Check ABIs at next follow-up.  Substernal chest when recumbent associated with weight loss and aspirin use in addition to daily Aggrenox.  I discussed possible etiologies of this including cardiac, GI, urologic, other.  I have advised him to call Dr. Inda Merlin today to see if he can be seen and evaluated sooner than next month's follow-up appointment.  I also advised to proceed to nearest ED should this pain worsen or he develops SOB, syncope, vomiting, hemetemesis, hematochezia, fever.  Barbie Banner, PA-C [facility] [phone]  Clinic MD:  Scot Dock

## 2020-02-12 ENCOUNTER — Ambulatory Visit (HOSPITAL_COMMUNITY)
Admission: RE | Admit: 2020-02-12 | Discharge: 2020-02-12 | Disposition: A | Discharge status: Home / Self Care | Payer: Medicare Other | Source: Ambulatory Visit | Attending: Urology | Admitting: Urology

## 2020-02-12 ENCOUNTER — Other Ambulatory Visit: Payer: Self-pay

## 2020-02-12 DIAGNOSIS — N500 Atrophy of testis: Secondary | ICD-10-CM

## 2020-02-18 ENCOUNTER — Ambulatory Visit
Admission: RE | Admit: 2020-02-18 | Discharge: 2020-02-18 | Disposition: A | Discharge status: Home / Self Care | Payer: Medicare Other | Source: Ambulatory Visit | Attending: Internal Medicine | Admitting: Internal Medicine

## 2020-02-18 ENCOUNTER — Other Ambulatory Visit: Payer: Self-pay | Admitting: Internal Medicine

## 2020-02-18 DIAGNOSIS — J449 Chronic obstructive pulmonary disease, unspecified: Secondary | ICD-10-CM

## 2020-02-19 ENCOUNTER — Other Ambulatory Visit (HOSPITAL_COMMUNITY): Payer: Self-pay | Admitting: Internal Medicine

## 2020-02-19 DIAGNOSIS — E0590 Thyrotoxicosis, unspecified without thyrotoxic crisis or storm: Secondary | ICD-10-CM

## 2020-02-26 ENCOUNTER — Other Ambulatory Visit (HOSPITAL_COMMUNITY): Payer: Self-pay | Admitting: Internal Medicine

## 2020-02-26 DIAGNOSIS — E0590 Thyrotoxicosis, unspecified without thyrotoxic crisis or storm: Secondary | ICD-10-CM

## 2020-02-27 ENCOUNTER — Ambulatory Visit: Payer: Medicare Other

## 2020-02-28 ENCOUNTER — Other Ambulatory Visit (HOSPITAL_COMMUNITY): Payer: Medicare Other

## 2020-02-28 ENCOUNTER — Ambulatory Visit (HOSPITAL_COMMUNITY)
Admission: RE | Admit: 2020-02-28 | Discharge: 2020-02-28 | Disposition: A | Discharge status: Home / Self Care | Payer: Medicare Other | Source: Ambulatory Visit | Attending: Internal Medicine | Admitting: Internal Medicine

## 2020-02-28 ENCOUNTER — Ambulatory Visit (HOSPITAL_COMMUNITY): Admission: RE | Admit: 2020-02-28 | Payer: Medicare Other | Source: Ambulatory Visit

## 2020-02-28 ENCOUNTER — Ambulatory Visit (HOSPITAL_COMMUNITY): Payer: Medicare Other

## 2020-02-28 ENCOUNTER — Ambulatory Visit: Payer: Medicare Other

## 2020-02-28 ENCOUNTER — Other Ambulatory Visit: Payer: Self-pay

## 2020-02-28 DIAGNOSIS — E0590 Thyrotoxicosis, unspecified without thyrotoxic crisis or storm: Secondary | ICD-10-CM | POA: Diagnosis not present

## 2020-02-28 MED ORDER — SODIUM IODIDE I-123 7.4 MBQ CAPS
420.0000 | ORAL_CAPSULE | Freq: Once | ORAL | Status: AC
  Administered 2020-02-28: 420 via ORAL

## 2020-02-29 ENCOUNTER — Ambulatory Visit (HOSPITAL_COMMUNITY)
Admission: RE | Admit: 2020-02-29 | Discharge: 2020-02-29 | Disposition: A | Discharge status: Home / Self Care | Payer: Medicare Other | Source: Ambulatory Visit | Attending: Internal Medicine | Admitting: Internal Medicine

## 2020-02-29 ENCOUNTER — Other Ambulatory Visit (HOSPITAL_COMMUNITY): Payer: Medicare Other

## 2020-03-05 ENCOUNTER — Other Ambulatory Visit: Payer: Self-pay | Admitting: Internal Medicine

## 2020-03-05 ENCOUNTER — Other Ambulatory Visit (HOSPITAL_COMMUNITY): Payer: Self-pay | Admitting: Internal Medicine

## 2020-03-12 ENCOUNTER — Other Ambulatory Visit (HOSPITAL_COMMUNITY): Payer: Self-pay | Admitting: Internal Medicine

## 2020-03-12 DIAGNOSIS — R634 Abnormal weight loss: Secondary | ICD-10-CM

## 2020-03-12 DIAGNOSIS — E0500 Thyrotoxicosis with diffuse goiter without thyrotoxic crisis or storm: Secondary | ICD-10-CM

## 2020-03-12 DIAGNOSIS — E0590 Thyrotoxicosis, unspecified without thyrotoxic crisis or storm: Secondary | ICD-10-CM

## 2020-03-17 ENCOUNTER — Other Ambulatory Visit: Payer: Self-pay

## 2020-03-17 ENCOUNTER — Ambulatory Visit (HOSPITAL_COMMUNITY)
Admission: RE | Admit: 2020-03-17 | Discharge: 2020-03-17 | Disposition: A | Discharge status: Home / Self Care | Payer: Medicare Other | Source: Ambulatory Visit | Attending: Internal Medicine | Admitting: Internal Medicine

## 2020-03-17 DIAGNOSIS — R634 Abnormal weight loss: Secondary | ICD-10-CM | POA: Diagnosis present

## 2020-03-17 DIAGNOSIS — E0500 Thyrotoxicosis with diffuse goiter without thyrotoxic crisis or storm: Secondary | ICD-10-CM | POA: Insufficient documentation

## 2020-03-17 DIAGNOSIS — E0590 Thyrotoxicosis, unspecified without thyrotoxic crisis or storm: Secondary | ICD-10-CM | POA: Diagnosis present

## 2020-03-17 MED ORDER — SODIUM IODIDE I 131 CAPSULE
14.3000 | Freq: Once | INTRAVENOUS | Status: AC | PRN
  Administered 2020-03-17: 14.3 via ORAL

## 2020-06-30 ENCOUNTER — Telehealth: Payer: Self-pay

## 2020-06-30 NOTE — Telephone Encounter (Signed)
error 

## 2020-10-01 ENCOUNTER — Other Ambulatory Visit: Payer: Self-pay | Admitting: Internal Medicine

## 2020-10-01 DIAGNOSIS — Z8673 Personal history of transient ischemic attack (TIA), and cerebral infarction without residual deficits: Secondary | ICD-10-CM

## 2020-10-15 ENCOUNTER — Other Ambulatory Visit: Payer: Self-pay | Admitting: Neurology

## 2020-11-30 ENCOUNTER — Other Ambulatory Visit: Payer: Self-pay

## 2020-11-30 ENCOUNTER — Emergency Department (HOSPITAL_BASED_OUTPATIENT_CLINIC_OR_DEPARTMENT_OTHER): Payer: Medicare Other

## 2020-11-30 ENCOUNTER — Emergency Department (HOSPITAL_BASED_OUTPATIENT_CLINIC_OR_DEPARTMENT_OTHER)
Admission: EM | Admit: 2020-11-30 | Discharge: 2020-11-30 | Disposition: A | Discharge status: Home / Self Care | Payer: Medicare Other | Attending: Emergency Medicine | Admitting: Emergency Medicine

## 2020-11-30 DIAGNOSIS — W231XXA Caught, crushed, jammed, or pinched between stationary objects, initial encounter: Secondary | ICD-10-CM | POA: Diagnosis not present

## 2020-11-30 DIAGNOSIS — Z79899 Other long term (current) drug therapy: Secondary | ICD-10-CM | POA: Diagnosis not present

## 2020-11-30 DIAGNOSIS — M25531 Pain in right wrist: Secondary | ICD-10-CM | POA: Insufficient documentation

## 2020-11-30 DIAGNOSIS — F17210 Nicotine dependence, cigarettes, uncomplicated: Secondary | ICD-10-CM | POA: Insufficient documentation

## 2020-11-30 DIAGNOSIS — J449 Chronic obstructive pulmonary disease, unspecified: Secondary | ICD-10-CM | POA: Diagnosis not present

## 2020-11-30 DIAGNOSIS — I10 Essential (primary) hypertension: Secondary | ICD-10-CM | POA: Diagnosis not present

## 2020-11-30 MED ORDER — PREDNISONE 20 MG PO TABS: 40.0000 mg | ORAL_TABLET | Freq: Every day | ORAL | 0 refills | Status: AC

## 2020-11-30 NOTE — Discharge Instructions (Addendum)
X-ray was ok.  Most likely inflammed from working on the lawnmower.  Wear the brace at all time except with showering.  Steroids should help with pain and swelling.  Follow up with your doctor if it is not getting better.

## 2020-11-30 NOTE — ED Triage Notes (Signed)
Right wrist pain Mild swelling , limited movement Hx osteoarthritis

## 2020-11-30 NOTE — ED Provider Notes (Signed)
[facility] EMERGENCY DEPARTMENT Provider Note   CSN: [id number] Arrival date & time: 11/30/20  0845     History No chief complaint on file.   [name] is a 65 y.o. male.  Patient is a 65 year old male with a history of hypertension, hyperlipidemia, COPD, seizure disorder after stroke and AAA which is currently being monitored presenting today with pain and swelling in the right wrist that started approximately 3 days ago.  Patient has been working on his lawnmower and has been doing a lot with his hand.  He has been pulling hard on wrenches and thought maybe he did something.  This happened on Thursday and he started having pain Friday night.  The pain is a throbbing searing type of pain that is worse with any movement of the wrist.  He started noticing it was getting swollen but denies any redness.  He denies any myalgias or fevers.  He was using Voltaren gel and had taken 800 mg of ibuprofen with only minimal relief.  Moving it makes it much worse.  He denies any numbness or tingling in his fingers.  It does not hurt to move his fingers.  No prior surgeries of the wrist or similar symptoms.  Patient is right-handed.  The history is provided by the patient.      Past Medical History:  Diagnosis Date   AAA (abdominal aortic aneurysm) (HCC)    4.6 cm x 4. 5 cm per 05-30-[facility] ct referred to dr early to see april 2019   Anemia    Artery stenosis (HCC)    L carotid   Arthritis    BACK   Back pain    Complication of anesthesia    WOKE UP DURING COLONSCOPY   COPD (chronic obstructive pulmonary disease) ([place])    CVA (cerebral infarction) 05/27/09   TIA, posterior circulation 7/11   Depression    Glaucoma    LEFT EYE WORSE   Groin swelling    Hemorrhage 1996   intracerebral   History of kidney stones    HLD (hyperlipidemia)    HTN (hypertension)    ICAO (internal carotid artery occlusion)    right ICA stenosis, s/p R carotid endarterectomy on 06/13/09    Meningitis    viral; as a child    Monoclonal gammopathy of unknown significance 02/07/2013   No anemia,no elevation of Iggs,  no m-spike, ?monoclonal protein on IFE 9/14   PONV (postoperative nausea and vomiting)    NAUSEA   Seizure disorder ([place])    at time of [place] in 1996. No seizures for quite awhile   Seizures ([place])     5 years ago   Stroke [facility]) March 2011    Patient Active Problem List   Diagnosis Date Noted   History of stroke 11/11/2017   Carotid artery stenosis 08/08/2017   Sepsis secondary to UTI ([place]) 12/12/2016   Essential hypertension 12/12/2016   Leukocytosis 12/12/2016   Dyslipidemia 12/12/2016   Acute renal failure (ARF) ([place]) 12/12/2016   Anemia of chronic disease 12/12/2016   History of TIA (transient ischemic attack) 11/11/2016   Monoclonal gammopathy of unknown significance 02/07/2013   Stenosis of carotid artery 09/12/2012   Seizure disorder ([place]) 05/18/2010    Past Surgical History:  Procedure Laterality Date   CAROTID ENDARTERECTOMY Right 06/12/09   cea   COLONOSCOPY  06/16/01   by Dr. Earle Gell. positive for colonic polyp, non-adenomatous    COLONOSCOPY WITH PROPOFOL N/A 02/03/2015  Procedure: COLONOSCOPY WITH PROPOFOL;  Surgeon: Garlan Fair, MD;  [facility];  Service: Endoscopy;  Laterality: N/A;   CYSTOSCOPY/URETEROSCOPY/HOLMIUM LASER/STENT PLACEMENT Left 06/15/2017   Procedure: CYSTOSCOPY/RETROGRADE/URETEROSCOPY/ /STENT PLACEMENT;  Surgeon: Ceasar Mons, MD;  Location: [facility];  Service: Urology;  Laterality: Left;   ENDARTERECTOMY Left 08/08/2017   Procedure: ENDARTERECTOMY CAROTID LEFT;  Surgeon: Rosetta Posner, MD;  Location: [facility] OR;  Service: Vascular;  Laterality: Left;   GLAUCOMA REPAIR  2016   by taking out cataracts (bilateral)   PATCH ANGIOPLASTY Left 08/08/2017   Procedure: PATCH ANGIOPLASTY CAROTID LEFT USING HEMASHIELD PATCH;  Surgeon: Rosetta Posner, MD;  [facility];  Service:  Vascular;  Laterality: Left;   R CEA  3/11   R inguinal hernia repair  07/05/09       Family History  Problem Relation Age of Onset   Heart disease Father        Heart Disease before age 20   Heart attack Father    Heart disease Mother    Hyperlipidemia Brother    Hyperlipidemia Sister    Heart attack Sister        in younger sister    Social History   Tobacco Use   Smoking status: Every Day    Packs/day: 1.00    Types: Cigarettes   Smokeless tobacco: Current    Types: Chew  Vaping Use   Vaping Use: Former  Substance Use Topics   Alcohol use: Yes    Alcohol/week: 0.0 standard drinks    Comment: occasional (has a hx of alcohol abuse - been in remission since 12/08/09)   Drug use: Not Currently    Types: Marijuana    Comment: MARIJUANA 30 YRS AGO    Home Medications Prior to Admission medications   Medication Sig Start Date End Date Taking? Authorizing Provider  predniSONE (DELTASONE) 20 MG tablet Take 2 tablets (40 mg total) by mouth daily. 11/30/20  Yes Raysean Graumann, Loree Fee, MD  albuterol (VENTOLIN HFA) 108 (90 Base) MCG/ACT inhaler Inhale 2 puffs into the lungs every 4 (four) hours as needed. 05/03/19   [provider]  ALPRAZolam Duanne Moron) 0.25 MG tablet Take 0.25 mg by mouth daily as needed. 05/09/18   [provider]  AMLODIPINE-VALSARTAN-HCTZ PO  10/01/18   [provider]  B-D 3CC LUER-LOK SYR 21GX1" 21G X 1" 3 ML MISC  12/28/19   [provider]  budesonide-formoterol (SYMBICORT) 80-4.5 MCG/ACT inhaler Inhale 2 puffs into the lungs 2 (two) times daily as needed (for wheezing).     [provider]  Cholecalciferol (VITAMIN D) 2000 units tablet Take 2,000 Units by mouth daily.     [provider]  dipyridamole-aspirin (AGGRENOX) 200-25 MG 12hr capsule Take 1 capsule by mouth 2 (two) times daily. 11/22/19   Garvin Fila, MD  levETIRAcetam (KEPPRA XR) 500 MG 24 hr tablet TAKE 2 TABLETS (1,000 MG TOTAL) BY MOUTH DAILY.  10/15/20   Garvin Fila, MD  pantoprazole (PROTONIX) 40 MG tablet  11/11/18   [provider]  rosuvastatin (CRESTOR) 10 MG tablet  11/11/18   [provider]  sertraline (ZOLOFT) 50 MG tablet Take 50 mg by mouth every morning.     [provider]  testosterone cypionate (DEPOTESTOSTERONE CYPIONATE) 200 MG/ML injection Inject 200 mg into the muscle every 14 (fourteen) days. 05/18/19   [provider]    Allergies    Tape  Review of Systems   Review of  Systems  All other systems reviewed and are negative.  Physical Exam Updated Vital Signs BP (!) 168/83 (BP Location: Left Arm)   Pulse 75   Temp 97.9 F (36.6 C) (Oral)   Resp 18   Ht '6\' 2"'$  (1.88 m)   Wt 78.5 kg   SpO2 100%   BMI 22.21 kg/m   Physical Exam Vitals and nursing note reviewed.  Constitutional:      Appearance: Normal appearance.  HENT:     Head: Normocephalic.  Eyes:     Pupils: Pupils are equal, round, and reactive to light.  Cardiovascular:     Rate and Rhythm: Normal rate.     Pulses: Normal pulses.  Pulmonary:     Effort: Pulmonary effort is normal.  Musculoskeletal:        General: Tenderness present.     Right wrist: Swelling, tenderness, bony tenderness and snuff box tenderness present. Decreased range of motion.       Arms:     Cervical back: Normal range of motion and neck supple.  Skin:    General: Skin is warm and dry.  Neurological:     General: No focal deficit present.     Mental Status: He is alert and oriented to person, place, and time. Mental status is at baseline.  Psychiatric:        Mood and Affect: Mood normal.        Behavior: Behavior normal.    ED Results / Procedures / Treatments   Labs (all labs ordered are listed, but only abnormal results are displayed) Labs Reviewed - No data to display  EKG None  Radiology DG Wrist Complete Right  Result Date: 11/30/2020 CLINICAL DATA:  Wrist pain for 2 days without injury EXAM: RIGHT WRIST -  COMPLETE 3+ VIEW COMPARISON:  None. FINDINGS: No acute fracture or dislocation. Degenerative joint space narrowing and subchondral sclerosis about the radiocarpal articulation and the radial aspect of the intercarpal bones. Scaphoid intact. Ulnar styloid normal. IMPRESSION: Degenerative change, without acute osseous finding. Electronically Signed   By: Abigail Miyamoto M.D.   On: 11/30/2020 09:49    Procedures Procedures   Medications Ordered in ED Medications - No data to display  ED Course  I have reviewed the triage vital signs and the nursing notes.  Pertinent labs & imaging results that were available during my care of the patient were reviewed by me and considered in my medical decision making (see chart for details).    MDM Rules/Calculators/A&P                           Patient with pain and swelling in the right wrist for the last 3 days that is gradually worsening.  He has mild warmth to the area but no erythema.  Tenderness is present mostly over the radial portion of the wrist.  It is made worse with flexion.  This is in the setting of recently working on his lawnmower and said his wrist did get jammed when he was using a wrench.  Suspect head tendinitis versus fracture.  Lower suspicion for gout and septic arthritis at this time.  Patient denies any systemic symptoms.  He is otherwise well-appearing.  Plain films pending.  10:07 AM Plain films with arthritis.  Suspect overuse injury and inflammation.  Immobilized and pt d/ced on prednisone and to continue nsaids.  MDM   Amount and/or Complexity of Data Reviewed Tests in the radiology section  of CPT: ordered and reviewed Independent visualization of images, tracings, or specimens: yes    Final Clinical Impression(s) / ED Diagnoses Final diagnoses:  Right wrist pain    Rx / DC Orders ED Discharge Orders          Ordered    predniSONE (DELTASONE) 20 MG tablet  Daily        11/30/20 1004             Blanchie Dessert, MD 11/30/20 1007

## 2020-11-30 NOTE — ED Notes (Signed)
Patient transported to X-ray 

## 2020-12-18 ENCOUNTER — Ambulatory Visit: Payer: Medicare Other | Admitting: Neurology

## 2021-01-01 ENCOUNTER — Encounter: Payer: Self-pay | Admitting: Neurology

## 2021-01-01 ENCOUNTER — Ambulatory Visit (INDEPENDENT_AMBULATORY_CARE_PROVIDER_SITE_OTHER): Payer: Medicare Other | Admitting: Neurology

## 2021-01-01 VITALS — BP 110/70 | HR 69 | Ht 74.0 in | Wt 178.0 lb

## 2021-01-01 DIAGNOSIS — R0989 Other specified symptoms and signs involving the circulatory and respiratory systems: Secondary | ICD-10-CM

## 2021-01-01 DIAGNOSIS — Z8669 Personal history of other diseases of the nervous system and sense organs: Secondary | ICD-10-CM | POA: Diagnosis not present

## 2021-01-01 DIAGNOSIS — I6990 Unspecified sequelae of unspecified cerebrovascular disease: Secondary | ICD-10-CM

## 2021-01-01 MED ORDER — ASPIRIN EC 81 MG PO TBEC: 81.0000 mg | DELAYED_RELEASE_TABLET | Freq: Every day | ORAL | 11 refills | Status: AC

## 2021-01-01 MED ORDER — LEVETIRACETAM ER 500 MG PO TB24: 1000.0000 mg | ORAL_TABLET | Freq: Every day | ORAL | 3 refills | Status: AC

## 2021-01-01 NOTE — Patient Instructions (Signed)
I had a long discussion with the patient regarding his remote TIA and seizures and recommend continue Keppra XR 1000 mg daily at the present time.  He was advised to avoid activities which may trigger dehydration and to keep himself well-hydrated and drink plenty of fluids.  He was advised to stop Aggrenox due to high cost involved and he has been without recurrent TIA/stroke for years and instead switch to aspirin 81 mg daily instead for stroke prevention and maintain aggressive risk factor modification and asked to cut back and quit smoking completely.   Keep LDL cholesterol goal below 70 mg percent and diabetes with hemoglobin A1c goal below 6.5% and hypertension with blood pressure goal below 130/90.  Continue conservative follow-up for his left carotid  re stenosis and bruit at the vascular surgery clinic.  And keep scheduled appointment next month with Dr. Donnetta Hutching. he will return for follow-up in the future only as necessary no schedule appointment was made.

## 2021-01-01 NOTE — Progress Notes (Signed)
[facility]  PATIENT: [name] DOB: 03-17-1956   REASON FOR VISIT: Follow-up for seizure disorder, history of stroke , risk factors of smoking hypertension hyperlipidemia HISTORY FROM: Patient    HISTORY OF PRESENT ILLNESS:UPDATE 8/2/2019CM [name], 65 year old male returns for follow-up with history of seizure disorder currently well controlled on Tegretol without side effects and no seizures in several years.  He also has a history of posterior circulatory TIA in September 2011 and remote brain intracranial hemorrhage in 1996.  He has risk factors of hypertension hyperlipidemia smoking, carotid artery disease.  He continues to work full-time he remains on Aggrenox for secondary stroke prevention without bruising or bleeding he has not had further stroke or TIA symptoms.  He is also on Crestor without myalgias.  Most recent carotid Doppler done 07/22/2017 with severe stenosis involving the proximal left internal carotid artery measuring greater than 70%.  Extensive atherosclerotic disease in bilateral carotid arteries estimated degree of stenosis in the right is less than 50%..  Patent vertebral arteries with antegrade flow.  He had left carotid endarterectomy on 08/08/2017.  He also had CT of the abdomen 05/20/2017 which demonstrated 4.6 cm aortic aneurysm.  He is followed by Dr. Donnetta Hutching.  Blood pressure in the office today 144/80.  Patient claims he has not taken his blood pressure medicines this morning.  He returns for reevaluation.  UPDATE 11/11/16 CM [name], 65 year old male returns for yearly follow-up. He has a history of seizure disorder with no seizures in several years. He is currently maintained on Tegretol and he claims recent labs were done at Dr. Inda Merlin office. He also has a history of posterior circulatory TIA in September 2011, and remote brain intracranial hemorrhage in 1996. He has risk factors of smoking hypertension hyperlipidemia. 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 He is currently on Aggrenox for secondary stroke prevention without bruising or bleeding. He has not had further stroke or TIA symptoms He is on Crestor without complaints of myalgias. Blood pressure in the office today 139/81. He returns for reevaluation   UPDATE 8/10/17CMMr. [name], 65 year old male returns for yearly followup.  .He has a history of posterior circulatory TIA in September of 2011 with vascular risk factors of smoking, hypertension, hyperlipidemia, and remote brain intracranial hemorrhage in 1996. He also has had a seizure disorder since 1978 following encephalitis with breakthrough seizures occurring following a brain hemorrhage in 1996 currently well controlled on Tegretol. In addition he has history of right subcortical infarct in February of 2011 right carotid endarterectomy in March of 2011 He was initially started on Plavix but had diarrhea on the drug. He was on Aggrenox  however stopped the drug due to the expense of greater than $200 a month. He was then  on aspirin now  back on Aggrenox as he now has insurance  He denies further stroke or TIA symptoms since last seen. Carotid duplex with Dr. Kellie Simmering 09/18/2013 with progression left ICA  stenosis.   Dopplers followed by [facility]. He works full-time as an Clinical biochemist He returns for reevaluation  .  Update 12/05/2018 : He returns for follow-up after last visit with Cecille Rubin, nurse practitioner a year ago.  Continues to do well without recurrent TIA or stroke symptoms none since 2011.  He is not also had any seizures for several years.  He remains on Aggrenox which is tolerating well without side effects.  States his blood pressure is under good control and today it is 130/75.  He remains on Crestor which is tolerating well without side effects and states last lipid profile was satisfactory.  He continues to smoke and states he cannot cut back and stop.  He remains on Tegretol-XR 400 mg 4 times daily and had  lab work in April 2018 which showed carbamazepine level of 11.8 and normal white count and BMP.  He is worried that his Tegretol-XR is going to being switched to the generic form and whether to be as effective.  Patient had last follow-up carotid ultrasound in January 2020 in Dr. Luther Parody office which showed 50% right ICA stenosis.  He plans on having a follow-up appointment soon Update 12/19/2019 : He returns for follow-up after last visit a year ago.  He states is done well without recurrent TIA or stroke symptoms.  He remains on Aggrenox which is tolerating well without side effects.  States his blood pressure usually runs pretty good though it is slightly elevated in office today at 150/88.  He is not sure when he last checked his lipids or A1c.  Patient states he had not had a unprovoked seizure for several years and a few months ago successfully transition from Tegretol XR which he was not able to afford due to insurance change to Keppra XR 1000 mg daily which he is able to afford.  He probably had a provoked seizure a few months ago when he was working outdoors on a trailer and became dehydrated and was seen in the ER in [facility].  His blood pressure was found to be low in the 60s.  I do not have access to those records but apparently his medications were not changed he was just given IV fluids and discharge.  Is done well since then without recurrence strokes TIAs or seizures.  He continues to smoke but now seems agreeable to cut back.  He had follow-up carotid ultrasound done on 07/18/2019 and Dr. Luther Parody office which showed bilateral for 40 to 59% carotid stenosis which appeared worse from previous study from January 2020. Update 01/01/2021 ; he returns for follow-up after last visit with me a year ago.  He continues to do well.  Has had no recurrent TIA or stroke symptoms.  Is also not had any seizures.  Is tolerating Keppra XR 500 mg 2 tablets at night well without side effects.  He has not  had any TIA or stroke symptoms now for multiple years but remains on Aggrenox which he finds very expensive and is wondering we can switch him to an alternative cheaper agent.  At last visit he had lipid profile checked with LDL cholesterol 76 mg percent and hemoglobin A1c 5.2 on 12/19/2019.  He is due for annual physical with his primary care physician Dr. Inda Merlin and will have another set of lab work soon.  His blood pressure under good control today it is 110/70.  He remains on Crestor which is tolerating well without muscle aches and pains.  Patient also has an appointment to see vascular surgeon Dr. Donnetta Hutching next month and will have follow-up carotid ultrasound done.  His last carotid ultrasound on 07/18/2019 had shown 40 to 59% bilateral  carotid restenosis which is being managed medically.  He has no new complaints today REVIEW OF SYSTEMS: Full 14 system review of systems performed and notable only for no complaints today, all others are neg:  No complaints today ALLERGIES: Allergies  Allergen Reactions   Tape Rash    ADHESIVE    HOME MEDICATIONS: Outpatient  Medications Prior to Visit  Medication Sig Dispense Refill   albuterol (VENTOLIN HFA) 108 (90 Base) MCG/ACT inhaler Inhale 2 puffs into the lungs every 4 (four) hours as needed.     ALPRAZolam (XANAX) 0.25 MG tablet Take 0.25 mg by mouth daily as needed.     AMLODIPINE-VALSARTAN-HCTZ PO      B-D 3CC LUER-LOK SYR 21GX1" 21G X 1" 3 ML MISC      budesonide-formoterol (SYMBICORT) 80-4.5 MCG/ACT inhaler Inhale 2 puffs into the lungs 2 (two) times daily as needed (for wheezing).      Cholecalciferol (VITAMIN D) 2000 units tablet Take 2,000 Units by mouth daily.      pantoprazole (PROTONIX) 40 MG tablet      predniSONE (DELTASONE) 20 MG tablet Take 2 tablets (40 mg total) by mouth daily. 10 tablet 0   rosuvastatin (CRESTOR) 10 MG tablet      sertraline (ZOLOFT) 50 MG tablet Take 50 mg by mouth every morning.      testosterone cypionate  (DEPOTESTOSTERONE CYPIONATE) 200 MG/ML injection Inject 200 mg into the muscle every 14 (fourteen) days.     dipyridamole-aspirin (AGGRENOX) 200-25 MG 12hr capsule Take 1 capsule by mouth 2 (two) times daily. 180 capsule 3   levETIRAcetam (KEPPRA XR) 500 MG 24 hr tablet TAKE 2 TABLETS (1,000 MG TOTAL) BY MOUTH DAILY. 180 tablet 3   No facility-administered medications prior to visit.    PAST MEDICAL HISTORY: Past Medical History:  Diagnosis Date   AAA (abdominal aortic aneurysm) (HCC)    4.6 cm x 4. 5 cm per 05-30-17 [facility] ct referred to dr early to see april 2019   Anemia    Artery stenosis (HCC)    L carotid   Arthritis    BACK   Back pain    Complication of anesthesia    WOKE UP DURING COLONSCOPY   COPD (chronic obstructive pulmonary disease) ([place])    CVA (cerebral infarction) 05/27/09   TIA, posterior circulation 7/11   Depression    Glaucoma    LEFT EYE WORSE   Groin swelling    Hemorrhage 1996   intracerebral   History of kidney stones    HLD (hyperlipidemia)    HTN (hypertension)    ICAO (internal carotid artery occlusion)    right ICA stenosis, s/p R carotid endarterectomy on 06/13/09   Meningitis    viral; as a child    Monoclonal gammopathy of unknown significance 02/07/2013   No anemia,no elevation of Iggs,  no m-spike, ?monoclonal protein on IFE 9/14   PONV (postoperative nausea and vomiting)    NAUSEA   Seizure disorder ([place])    at time of [place] in 1996. No seizures for quite awhile   Seizures ([place])     5 years ago   Stroke [facility]) March 2011    PAST SURGICAL HISTORY: Past Surgical History:  Procedure Laterality Date   CAROTID ENDARTERECTOMY Right 06/12/09   cea   COLONOSCOPY  06/16/01   by Dr. Earle Gell. positive for colonic polyp, non-adenomatous    COLONOSCOPY WITH PROPOFOL N/A 02/03/2015   Procedure: COLONOSCOPY WITH PROPOFOL;  Surgeon: Garlan Fair, MD;  Location: [facility];  Service: Endoscopy;  Laterality: N/A;    CYSTOSCOPY/URETEROSCOPY/HOLMIUM LASER/STENT PLACEMENT Left 06/15/2017   Procedure: CYSTOSCOPY/RETROGRADE/URETEROSCOPY/ /STENT PLACEMENT;  Surgeon: Ceasar Mons, MD;  Location: [facility];  Service: Urology;  Laterality: Left;   ENDARTERECTOMY Left 08/08/2017   Procedure: ENDARTERECTOMY CAROTID LEFT;  Surgeon: Rosetta Posner,  MD;  Location: [place];  Service: Vascular;  Laterality: Left;   GLAUCOMA REPAIR  2016   by taking out cataracts (bilateral)   PATCH ANGIOPLASTY Left 08/08/2017   Procedure: PATCH ANGIOPLASTY CAROTID LEFT USING HEMASHIELD PATCH;  Surgeon: Rosetta Posner, MD;  Location: [facility];  Service: Vascular;  Laterality: Left;   R CEA  3/11   R inguinal hernia repair  07/05/09    FAMILY HISTORY: Family History  Problem Relation Age of Onset   Heart disease Father        Heart Disease before age 28   Heart attack Father    Heart disease Mother    Hyperlipidemia Brother    Hyperlipidemia Sister    Heart attack Sister        in younger sister    SOCIAL HISTORY: Social History   Socioeconomic History   Marital status: Widowed    Spouse name: Not on file   Number of children: 2   Years of education: 12+   Highest education level: Not on file  Occupational History   Occupation: ELECTRICIAN    Employer: [employer]  Tobacco Use   Smoking status: Every Day    Packs/day: 1.00    Types: Cigarettes   Smokeless tobacco: Current    Types: Chew  Vaping Use   Vaping Use: Former  Substance and Sexual Activity   Alcohol use: Yes    Alcohol/week: 0.0 standard drinks    Comment: occasional (has a hx of alcohol abuse - been in remission since 12/08/09)   Drug use: Not Currently    Types: Marijuana    Comment: MARIJUANA 30 YRS AGO   Sexual activity: Not on file  Other Topics Concern   Not on file  Social History Narrative   Married, lives in [place] with his wife.   2 children (daughter, Janett Billow, is a Marine scientist on [employer] at [employer])    Patient is right handed    Patient has a high school education with some college.   Patient drinks at least 5 cups daily.         Social Determinants of Health   Financial Resource Strain: Not on file  Food Insecurity: Not on file  Transportation Needs: Not on file  Physical Activity: Not on file  Stress: Not on file  Social Connections: Not on file  Intimate Partner Violence: Not on file     PHYSICAL EXAM  Vitals:   01/01/21 1245  BP: 110/70  Pulse: 69  Weight: 178 lb (80.7 kg)  Height: 6\' 2"  (1.88 m)   Body mass index is 22.85 kg/m. General: well developed, well nourished middle aged 27 male, seated, in no evident distress   Head: head normocephalic and atraumatic.   Neck: supple with bilateral soft carotid bruits right greater than left.Bilateral  CEA scars.   Cardiovascular: regular rate and rhythm, no murmurs   Neurologic Exam   Mental Status: Awake and fully alert. Oriented to place and time. Follows all commands. Speech and language normal.   Cranial Nerves:  Pupils equal, briskly reactive to light. Extraocular movements full without nystagmus. Visual fields full to confrontation. Hearing intact and symmetric to finger snap. Facial sensation intact. Face, tongue, palate move normally and symmetrically. Neck flexion and extension normal.   Motor: Normal bulk and tone. Normal strength in all tested extremity muscles.No focal weakness   Sensory.: intact to touch and pinprick and vibratory.   Coordination: Rapid alternating movements normal in all extremities. Finger-to-nose and heel-to-shin  performed accurately bilaterally.   Gait and Station: Arises from chair without difficulty. Stance is normal. Gait demonstrates normal stride length and balance . Able to heel, toe and tandem walk with slightdifficulty.   Reflexes: 2+ and symmetric. Toes downgoing.   DIAGNOSTIC DATA (LABS, IMAGING, TESTING)  ASSESSMENT AND PLAN  65 y.o. year old male  has a past medical history of Hemorrhage (1996);  CVA (cerebral infarction) (05/27/09); HTN (hypertension); HLD (hyperlipidemia); ICAO (internal carotid artery occlusion); Artery stenosis; Seizure disorder; Meningitis; Stroke (March 2011); Myocardial infarction (March 2011); and Monoclonal gammopathy of unknown significance (02/07/2013). here to follow-up. He has not had stroke or TIA symptoms. He has not had any seizure activity in several years.  Most recent seizure was likely provoked by hypotension and dehydration 1 year ago.  He has tolerated switching from Tegretol to Keppra.  PLAN: I had a long discussion with the patient regarding his remote TIA and seizures and recommend continue Keppra XR 1000 mg daily at the present time.  He was advised to avoid activities which may trigger dehydration and to keep himself well-hydrated and drink plenty of fluids.  He was advised to stop Aggrenox due to high cost involved and he has been without recurrent TIA/stroke for years and instead switch to aspirin 81 mg daily instead for stroke prevention and maintain aggressive risk factor modification and asked to cut back and quit smoking completely.   Keep LDL cholesterol goal below 70 mg percent and diabetes with hemoglobin A1c goal below 6.5% and hypertension with blood pressure goal below 130/90.  Continue conservative follow-up for his left carotid  re stenosis and bruit at the vascular surgery clinic.  And keep scheduled appointment next month with Dr. Donnetta Hutching. he will return for follow-up in the future only as necessary no schedule appointment was made. Greater than 50% time during this 25-minute follow-up visit was spent on counseling and coordination of care about his carotid stenosis, TIAs risk and seizures and answering questions followup in the future with me in 1 year Antony Contras, MD [facility] [address], [place], [place] [address] [phone]

## 2021-01-05 ENCOUNTER — Other Ambulatory Visit: Payer: Self-pay | Admitting: *Deleted

## 2021-01-05 DIAGNOSIS — M25561 Pain in right knee: Secondary | ICD-10-CM

## 2021-01-05 DIAGNOSIS — I714 Abdominal aortic aneurysm, without rupture, unspecified: Secondary | ICD-10-CM

## 2021-01-05 DIAGNOSIS — I6523 Occlusion and stenosis of bilateral carotid arteries: Secondary | ICD-10-CM

## 2021-01-05 DIAGNOSIS — M25562 Pain in left knee: Secondary | ICD-10-CM

## 2021-01-12 ENCOUNTER — Ambulatory Visit (HOSPITAL_COMMUNITY)
Admission: RE | Admit: 2021-01-12 | Discharge: 2021-01-12 | Disposition: A | Discharge status: Home / Self Care | Payer: Medicare Other | Source: Ambulatory Visit | Attending: Surgery | Admitting: Surgery

## 2021-01-12 ENCOUNTER — Other Ambulatory Visit: Payer: Self-pay

## 2021-01-12 ENCOUNTER — Ambulatory Visit (INDEPENDENT_AMBULATORY_CARE_PROVIDER_SITE_OTHER): Payer: Medicare Other | Admitting: Physician Assistant

## 2021-01-12 ENCOUNTER — Ambulatory Visit (INDEPENDENT_AMBULATORY_CARE_PROVIDER_SITE_OTHER)
Admission: RE | Admit: 2021-01-12 | Discharge: 2021-01-12 | Disposition: A | Discharge status: Home / Self Care | Payer: Medicare Other | Source: Ambulatory Visit | Attending: Surgery | Admitting: Surgery

## 2021-01-12 VITALS — BP 106/74 | HR 74 | Temp 98.0°F | Resp 16 | Ht 74.0 in | Wt 176.0 lb

## 2021-01-12 DIAGNOSIS — I714 Abdominal aortic aneurysm, without rupture, unspecified: Secondary | ICD-10-CM | POA: Diagnosis present

## 2021-01-12 DIAGNOSIS — I6523 Occlusion and stenosis of bilateral carotid arteries: Secondary | ICD-10-CM | POA: Diagnosis present

## 2021-01-12 DIAGNOSIS — M25562 Pain in left knee: Secondary | ICD-10-CM

## 2021-01-12 DIAGNOSIS — M25561 Pain in right knee: Secondary | ICD-10-CM | POA: Diagnosis present

## 2021-01-12 DIAGNOSIS — F17200 Nicotine dependence, unspecified, uncomplicated: Secondary | ICD-10-CM

## 2021-01-12 DIAGNOSIS — I7143 Infrarenal abdominal aortic aneurysm, without rupture: Secondary | ICD-10-CM | POA: Diagnosis not present

## 2021-01-12 NOTE — Progress Notes (Signed)
HISTORY AND PHYSICAL     CC:  follow up. Requesting Provider:  [name], MD  HPI: This is a 65 y.o. male who who presents for 6 month follow-up of AAA which had increased in size by 0.4 cm over the course of approximately 13 months.  Today, he complains of drops in his blood pressure and was complaining of dizziness and near syncope.  He was evaluated in the emergency department at 1 September and was treated for dehydration and released.  At that time he said an EKG was performed and was not diagnosed with a cardiac condition.  Currently, he is complaining of substernal chest pain when he lays down at night.  He says this wakes him and keeps him from sleeping.  He gets up and takes several aspirin and is able to go back to sleep.  He also says he has had left upper arm pain and left flank pain associated with this.  He is complaining of early satiety and mild abdominal pain with meals.  He has had approximately 15 pound weight loss over the last 3 months.  He denies vomiting, hematemesis, hematochezia, shortness of breath, exertional chest pain, hematuria.   He reports a history of kidney stones and peptic ulcer in the past.   He also has a history of carotid artery stenosis.  He was last evaluated in March 2020 by Dr. Donnetta Hutching.  He is status post left carotid endarterectomy on 08/08/2017 and a remote history of right carotid endarterectomy in 2011. His duplex ultrasound in April also suggested that the proximal SMA demostrates elevated velocities suggesting a stenosis of >70%.  The pt returns today for follow up.    Pt denies any amaurosis fugax, speech difficulties, weakness, numbness, paralysis or clumsiness or facial droop.    Pt denies claudication, rest pain, or non healing wounds.  He denies any post prandial pain or fear of food.  He denies any dizziness.    The pt is on a statin for cholesterol management.    The pt is on an aspirin.    Other AC:  none The pt is on CCB, ARB for  hypertension.  The pt does not have diabetes. Tobacco hx:  current  Pt does not have family hx of AAA.    Past Medical History:  Diagnosis Date   AAA (abdominal aortic aneurysm) (HCC)    4.6 cm x 4. 5 cm per 05-30-17 [facility] ct referred to dr early to see april 2019   Anemia    Artery stenosis (HCC)    L carotid   Arthritis    BACK   Back pain    Complication of anesthesia    WOKE UP DURING COLONSCOPY   COPD (chronic obstructive pulmonary disease) ([place])    CVA (cerebral infarction) 05/27/09   TIA, posterior circulation 7/11   Depression    Glaucoma    LEFT EYE WORSE   Groin swelling    Hemorrhage 1996   intracerebral   History of kidney stones    HLD (hyperlipidemia)    HTN (hypertension)    ICAO (internal carotid artery occlusion)    right ICA stenosis, s/p R carotid endarterectomy on 06/13/09   Meningitis    viral; as a child    Monoclonal gammopathy of unknown significance 02/07/2013   No anemia,no elevation of Iggs,  no m-spike, ?monoclonal protein on IFE 9/14   PONV (postoperative nausea and vomiting)    NAUSEA   Seizure disorder ([place])  at time of ICH in 1996. No seizures for quite awhile   Seizures ([place])     5 years ago   Stroke [facility]) March 2011    Past Surgical History:  Procedure Laterality Date   CAROTID ENDARTERECTOMY Right 06/12/09   cea   COLONOSCOPY  06/16/01   by Dr. Earle Gell. positive for colonic polyp, non-adenomatous    COLONOSCOPY WITH PROPOFOL N/A 02/03/2015   Procedure: COLONOSCOPY WITH PROPOFOL;  Surgeon: Garlan Fair, MD;  Location: [facility];  Service: Endoscopy;  Laterality: N/A;   CYSTOSCOPY/URETEROSCOPY/HOLMIUM LASER/STENT PLACEMENT Left 06/15/2017   Procedure: CYSTOSCOPY/RETROGRADE/URETEROSCOPY/ /STENT PLACEMENT;  Surgeon: Ceasar Mons, MD;  Location: [facility];  Service: Urology;  Laterality: Left;   ENDARTERECTOMY Left 08/08/2017   Procedure: ENDARTERECTOMY CAROTID LEFT;  Surgeon: Rosetta Posner, MD;  Location: [place];  Service: Vascular;  Laterality: Left;   GLAUCOMA REPAIR  2016   by taking out cataracts (bilateral)   PATCH ANGIOPLASTY Left 08/08/2017   Procedure: PATCH ANGIOPLASTY CAROTID LEFT USING HEMASHIELD PATCH;  Surgeon: Rosetta Posner, MD;  Location: [facility];  Service: Vascular;  Laterality: Left;   R CEA  3/11   R inguinal hernia repair  07/05/09    Allergies  Allergen Reactions   Tape Rash    ADHESIVE    Current Outpatient Medications  Medication Sig Dispense Refill   albuterol (VENTOLIN HFA) 108 (90 Base) MCG/ACT inhaler Inhale 2 puffs into the lungs every 4 (four) hours as needed.     ALPRAZolam (XANAX) 0.25 MG tablet Take 0.25 mg by mouth daily as needed.     AMLODIPINE-VALSARTAN-HCTZ PO      aspirin EC 81 MG tablet Take 1 tablet (81 mg total) by mouth daily. Swallow whole. 30 tablet 11   B-D 3CC LUER-LOK SYR 21GX1" 21G X 1" 3 ML MISC      budesonide-formoterol (SYMBICORT) 80-4.5 MCG/ACT inhaler Inhale 2 puffs into the lungs 2 (two) times daily as needed (for wheezing).      Cholecalciferol (VITAMIN D) 2000 units tablet Take 2,000 Units by mouth daily.      levETIRAcetam (KEPPRA XR) 500 MG 24 hr tablet Take 2 tablets (1,000 mg total) by mouth daily. 180 tablet 3   pantoprazole (PROTONIX) 40 MG tablet      predniSONE (DELTASONE) 20 MG tablet Take 2 tablets (40 mg total) by mouth daily. 10 tablet 0   rosuvastatin (CRESTOR) 10 MG tablet      sertraline (ZOLOFT) 50 MG tablet Take 50 mg by mouth every morning.      testosterone cypionate (DEPOTESTOSTERONE CYPIONATE) 200 MG/ML injection Inject 200 mg into the muscle every 14 (fourteen) days.     No current facility-administered medications for this visit.    Family History  Problem Relation Age of Onset   Heart disease Father        Heart Disease before age 27   Heart attack Father    Heart disease Mother    Hyperlipidemia Brother    Hyperlipidemia Sister    Heart attack Sister        in younger sister     Social History   Socioeconomic History   Marital status: Widowed    Spouse name: Not on file   Number of children: 2   Years of education: 12+   Highest education level: Not on file  Occupational History   Occupation: ELECTRICIAN    Employer: [employer]  Tobacco Use   Smoking status: Every  Day    Packs/day: 1.00    Types: Cigarettes   Smokeless tobacco: Current    Types: Chew  Vaping Use   Vaping Use: Former  Substance and Sexual Activity   Alcohol use: Yes    Alcohol/week: 0.0 standard drinks    Comment: occasional (has a hx of alcohol abuse - been in remission since 12/08/09)   Drug use: Not Currently    Types: Marijuana    Comment: MARIJUANA 30 YRS AGO   Sexual activity: Not on file  Other Topics Concern   Not on file  Social History Narrative   Married, lives in [place] with his wife.   2 children (daughter, Janett Billow, is a Marine scientist on [employer] at [employer])    Patient is right handed   Patient has a high school education with some college.   Patient drinks at least 5 cups daily.         Social Determinants of Health   Financial Resource Strain: Not on file  Food Insecurity: Not on file  Transportation Needs: Not on file  Physical Activity: Not on file  Stress: Not on file  Social Connections: Not on file  Intimate Partner Violence: Not on file     REVIEW OF SYSTEMS:   [X]  denotes positive finding, [ ]  denotes negative finding Cardiac  Comments:  Chest pain or chest pressure:    Shortness of breath upon exertion:    Short of breath when lying flat:    Irregular heart rhythm:        Vascular    Pain in calf, thigh, or hip brought on by ambulation:    Pain in feet at night that wakes you up from your sleep:     Blood clot in your veins:    Leg swelling:         Pulmonary    Oxygen at home:    Wheezing:         Neurologic    Sudden weakness in arms or legs:     Sudden numbness in arms or legs:     Sudden onset of difficulty speaking or understanding  others    Temporary loss of vision in one eye:     Problems with dizziness:         Gastrointestinal    Blood in stool:     Vomited blood:         Genitourinary    Burning when urinating:     Blood in urine:        Psychiatric    Major depression:         Hematologic    Bleeding problems:    Problems with blood clotting too easily:        Skin    Rashes or ulcers:        Constitutional    Fever or chills:      PHYSICAL EXAMINATION:  Today's Vitals   01/12/21 0923 01/12/21 0928  BP: 128/69 106/74  Pulse: 74   Resp: 16   Temp: 98 F (36.7 C)   TempSrc: Temporal   SpO2: 96%   Weight: 176 lb (79.8 kg)   Height: 6\' 2"  (1.88 m)    Body mass index is 22.6 kg/m.   General:  WDWN in NAD; vital signs documented above Gait: Not observed HENT: WNL, normocephalic Pulmonary: normal non-labored breathing  Cardiac: regular HR;  without carotid bruits Abdomen: soft, NT, aortic pulse is not palpable Skin: without rashes Vascular Exam/Pulses:  Right  Left  Radial 1+ (weak) 2+ (normal)  DP 2+ (normal) 2+ (normal)  PT Unable to palpate Unable to palpate   Extremities: without ischemic changes, without Gangrene , without cellulitis; without open wounds;  Musculoskeletal: no muscle wasting or atrophy  Neurologic: A&O X 3;  speech is fluent/normal; moving all extremities equally  Psychiatric:  The pt has Normal affect.   Non-Invasive Vascular Imaging:   ABI's/TBI's on 01/12/2021: Right:  0.84/0.73 - great toe pressure:  129 Left:  0.86/0.71 - great toe pressure:  125  AAA and mesenteric Arterial duplex on 01/12/2021: Abdominal Aorta Findings:  +-----------+-------+----------+----------+--------+--------+--------+  Location   AP (cm)Trans (cm)PSV (cm/s)WaveformThrombusComments  +-----------+-------+----------+----------+--------+--------+--------+  Proximal   2.79   2.84      74                                   +-----------+-------+----------+----------+--------+--------+--------+  Mid        4.78   4.54      54                Present           +-----------+-------+----------+----------+--------+--------+--------+  Distal     3.90   4.08      34                Present           +-----------+-------+----------+----------+--------+--------+--------+  RT CIA Prox1.0    1.4       110                                 +-----------+-------+----------+----------+--------+--------+--------+  LT CIA Prox1.2    1.2       334                                 +-----------+-------+----------+----------+--------+--------+--------+  Abdominal Aorta: There is evidence of abnormal dilatation of the mid and  distal Abdominal aorta. The largest aortic measurement is 4.8 cm. The  largest aortic diameter remains essentially unchanged compared to prior  exam. Previous diameter measurement was   4.87 cm obtained on 02/06/2020.  Mesenteric:  70 to 99% stenosis in the superior mesenteric artery.  Non-Invasive Vascular Imaging:   Carotid Duplex on 01/12/2021: Right:  1-39% ICA stenosis Left:  40-59% ICA stenosis   Previous Carotid duplex on 07/18/2019: Right: 40-59% ICA stenosis Left:   40-59% ICA stenosis Vertebrals:  Bilateral vertebral arteries demonstrate antegrade flow.               Right vertebral artery waveforms are dampened with early systolic               deceleration.  Subclavians: Right subclavian artery flow was disturbed. Normal flow               hemodynamics were seen in the left subclavian artery. Right               subclavian artery demontrates dampened, turbulent, monphasic               waveforms.    ASSESSMENT/PLAN:: 65 y.o. male here for follow up for follow up for AAA, carotid stenosis with hx of bilateral CEA & SMA stenosis.  PAD -pt has easily palpable DP pulses bilaterally -pt does not have rest pain,  claudication, non healing wounds. -continue  graduated walking program -pt's ABI's around 85% bilaterally.  Discussed with pt if he starts having the above sx, we would repeat ABI.  Carotid stenosis -duplex today reveals 1-39% on the left and 40-59%.  Study reviewed with Dr. Trula Slade who reviewed previous CT of the chest.   -discussed s/s of stroke/TIA and if pt develops any sx, they know to call 911 or go to the emergency room -pt will f/u in one year with carotid duplex  SMA stenosis -pt's SMA stenosis is 70-99% with slightly elevated velocities from last visit.  He does not have any post prandial pain or fear of food.    AAA Pt's AAA remains at 4.8cm.  discussed this pt and studies reviewed with Dr. Trula Slade.  Will have pt return in 6 months with CTA of chest/abdomen/pelvis and see Dr. Trula Slade.  Pt is in agreement with this plan.  Pt knows that if he develops sharp, sudden abdominal or back pain, he is to call 911 and go to [facility] ER.  Current smoker: -discussed importance of smoking cessation.  Not really interested in quitting at this time.   -continue statin/asa    Leontine Locket, [facility] [phone]  Clinic MD:   Trula Slade

## 2021-02-10 DEATH — deceased

## 2022-01-11 ENCOUNTER — Ambulatory Visit: Payer: Self-pay | Admitting: Neurology

## 2022-01-11 ENCOUNTER — Encounter: Payer: Self-pay | Admitting: Neurology

## 2022-04-16 ENCOUNTER — Telehealth: Payer: Self-pay

## 2022-04-16 NOTE — Telephone Encounter (Signed)
Missing or Invalid Number - Due to non working # I have sent the pt a letter to schedule f/u up appt with [facility].

## 2022-08-19 IMAGING — CR DG CHEST 2V
2 series · 2 of 2 positions shown · non-contrast
Comparison: Radiograph 12/12/2016

CLINICAL DATA: COPD. Smoker.

EXAM:
CHEST - 2 VIEW

[w chest pa]
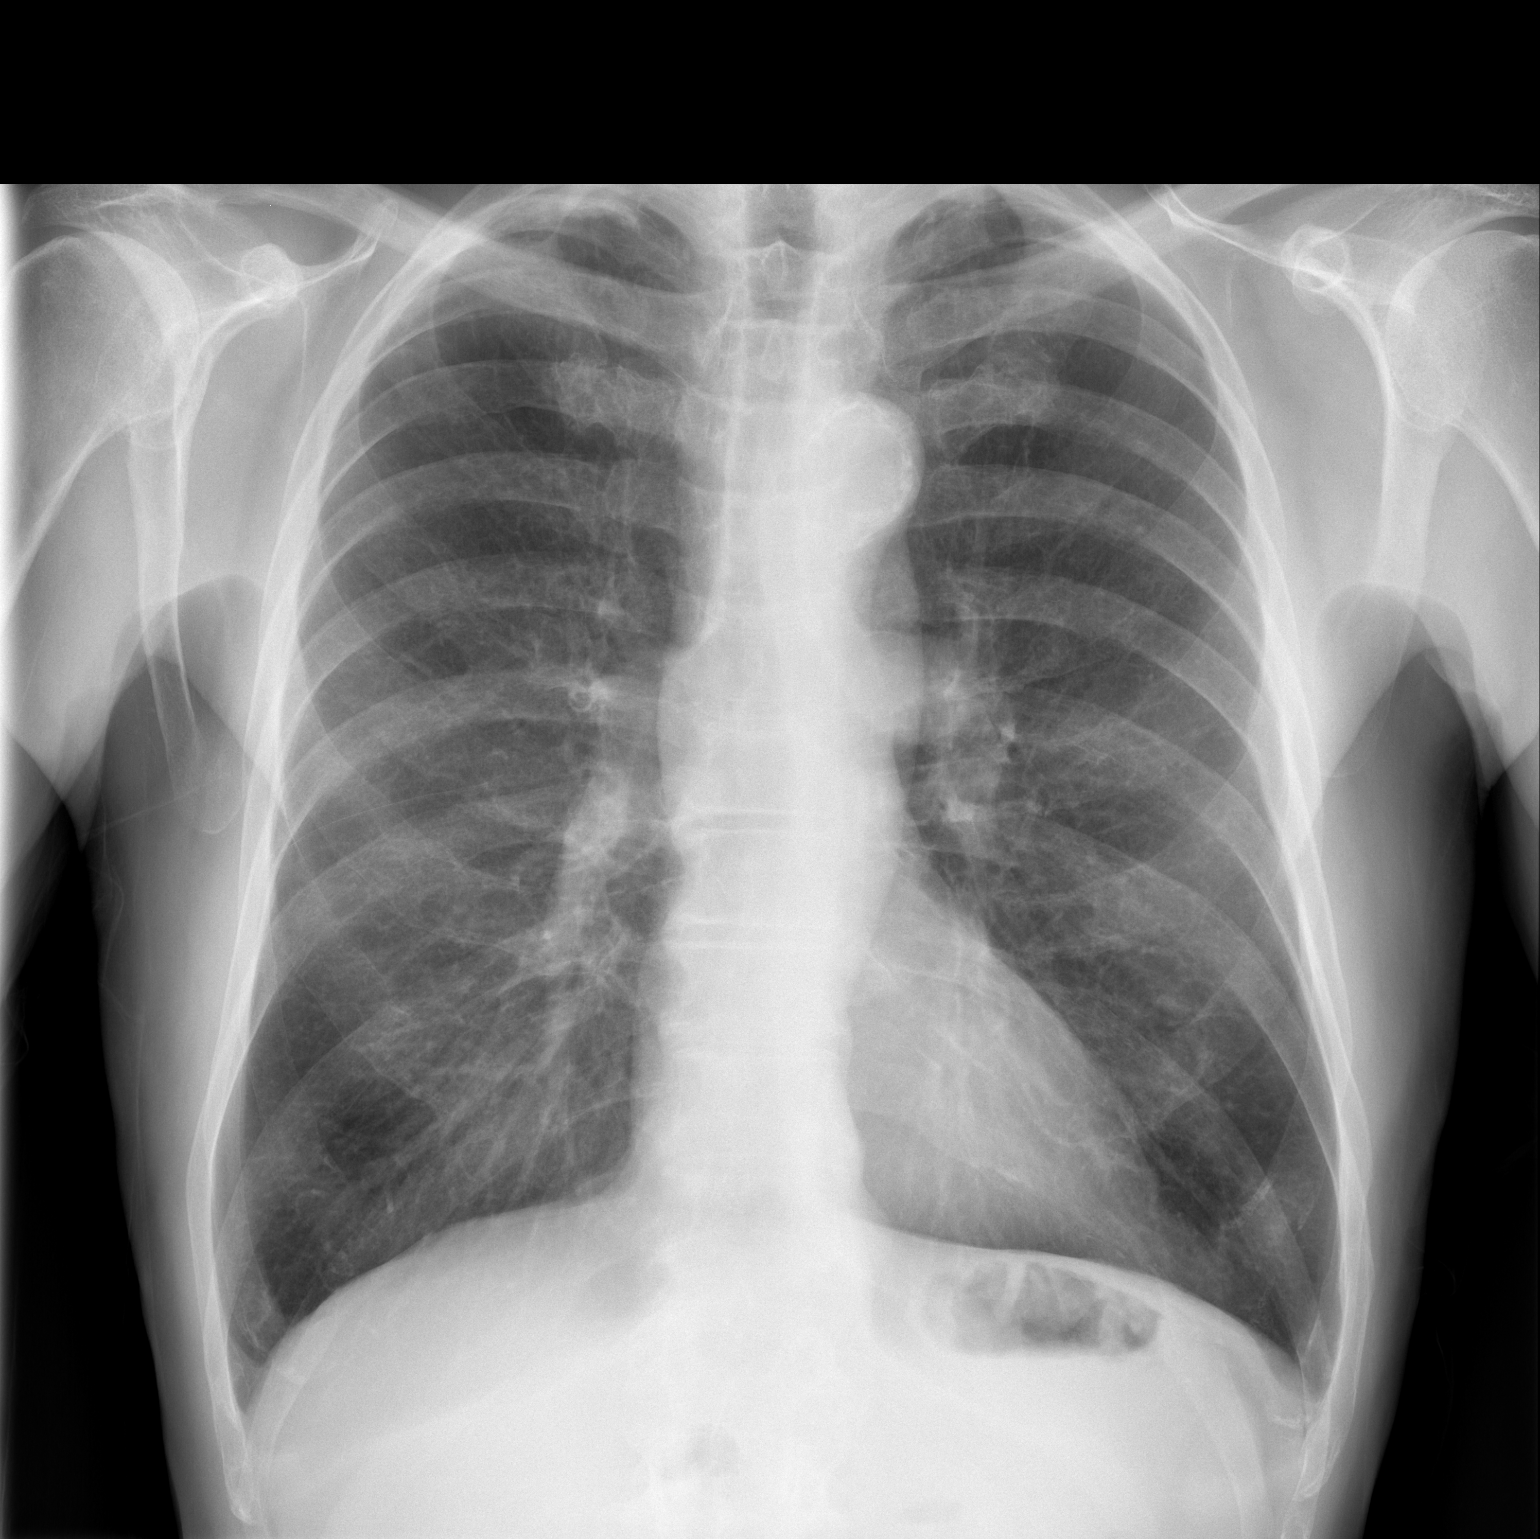

[w chest lat]
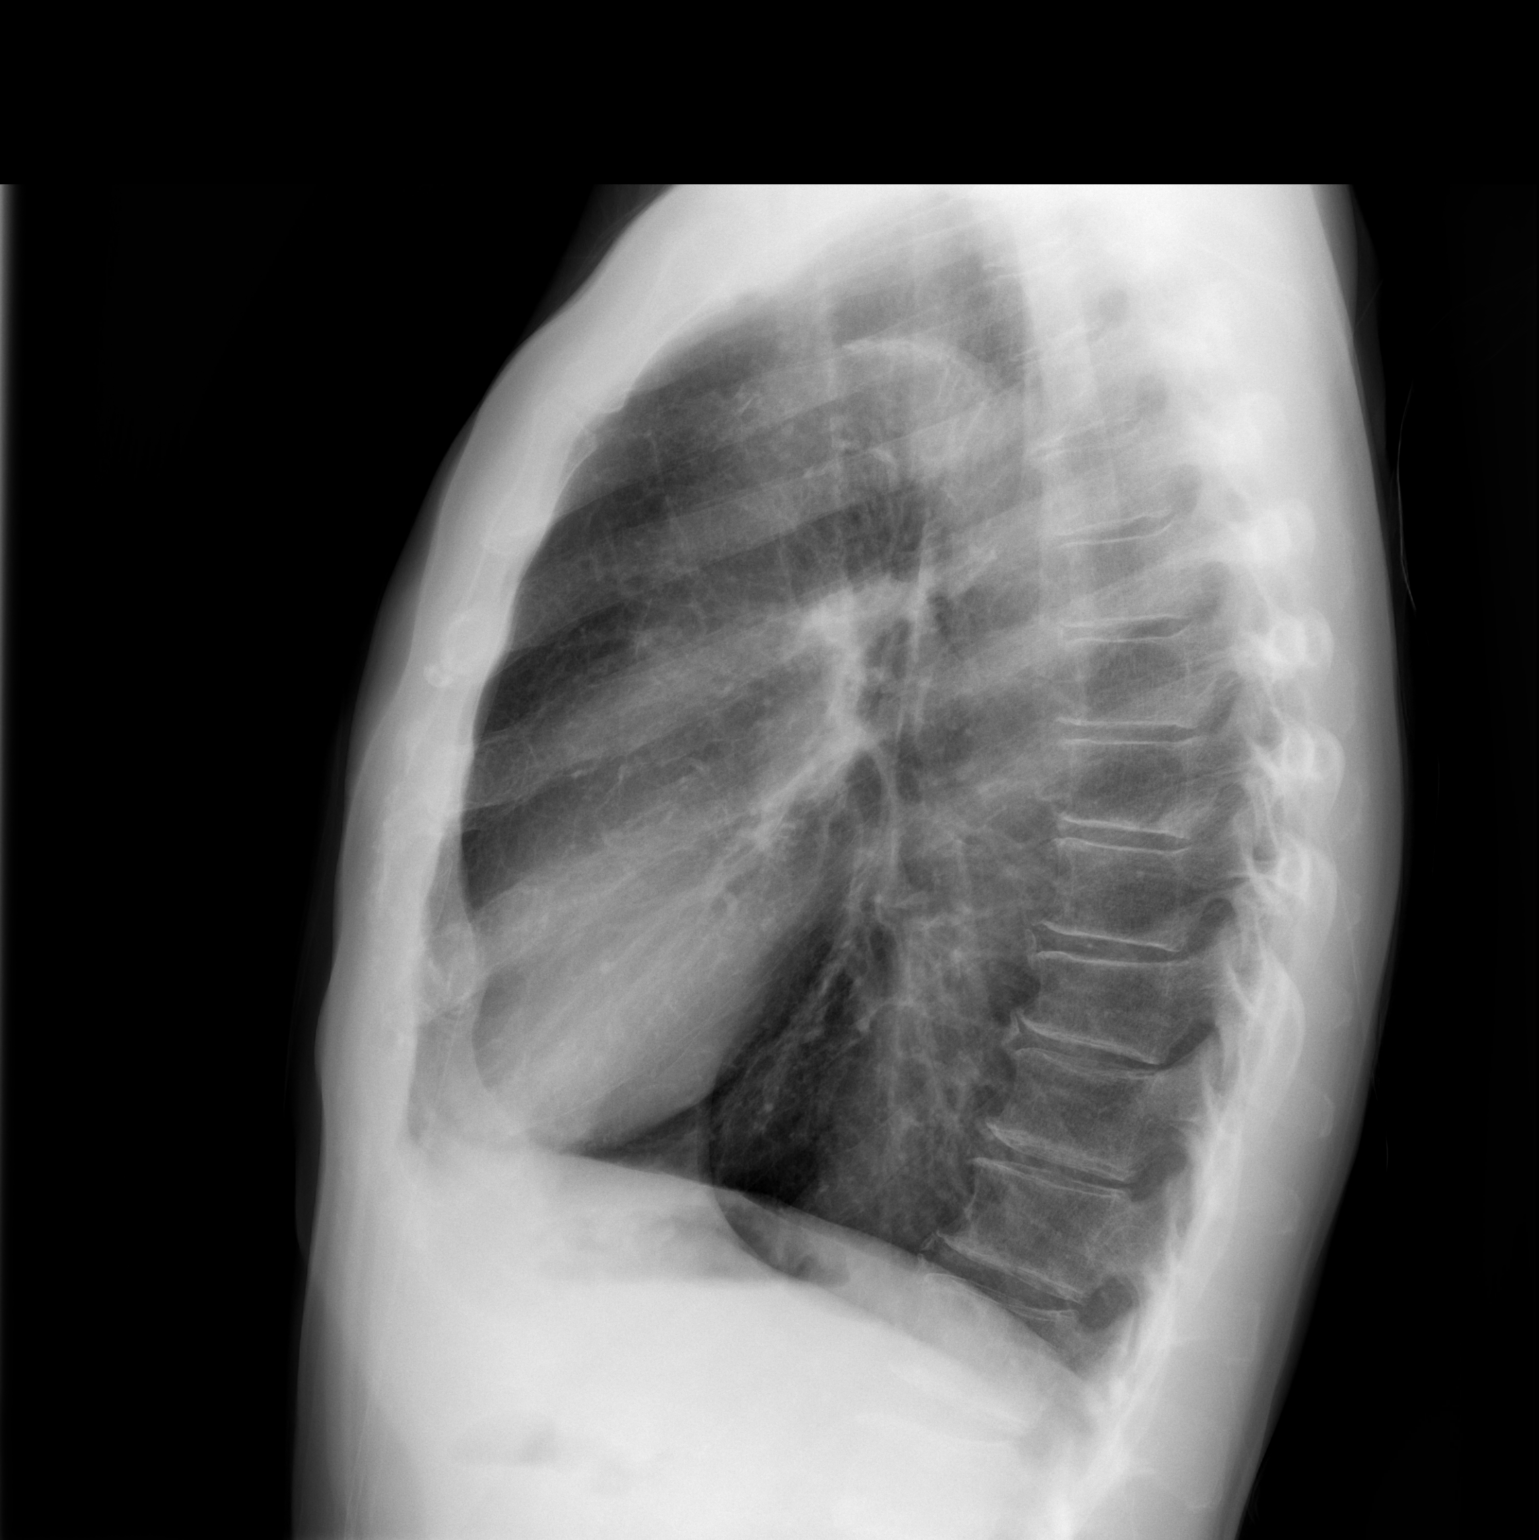

[2 of 2 positions shown; findings below may reference images not displayed]

FINDINGS: Symmetric lung aeration with hyperinflation. There is central
bronchial thickening. Biapical pleuroparenchymal scarring. Normal
heart size and mediastinal contours. Aortic atherosclerosis. There
is no focal airspace disease. No evidence of pulmonary mass or
visualized nodule. No pleural fluid or pneumothorax. No acute
osseous abnormalities are seen.
IMPRESSION: Hyperinflation and central bronchial thickening, imaging findings
consistent with COPD.
